# Patient Record
Sex: Female | Born: 1980 | Race: Black or African American | Hispanic: No | Marital: Single | Smoking: Current every day smoker
Health system: Southern US, Community
[De-identification: ages and names within clinical notes are randomized; demographics above are authoritative.]

## PROBLEM LIST (undated history)

## (undated) ENCOUNTER — Ambulatory Visit (HOSPITAL_COMMUNITY): Admission: EM | Payer: Medicaid Other | Source: Home / Self Care

## (undated) DIAGNOSIS — D649 Anemia, unspecified: Secondary | ICD-10-CM

## (undated) DIAGNOSIS — B999 Unspecified infectious disease: Secondary | ICD-10-CM

## (undated) DIAGNOSIS — D219 Benign neoplasm of connective and other soft tissue, unspecified: Secondary | ICD-10-CM

## (undated) DIAGNOSIS — R87629 Unspecified abnormal cytological findings in specimens from vagina: Secondary | ICD-10-CM

## (undated) DIAGNOSIS — IMO0002 Reserved for concepts with insufficient information to code with codable children: Secondary | ICD-10-CM

## (undated) DIAGNOSIS — R87619 Unspecified abnormal cytological findings in specimens from cervix uteri: Secondary | ICD-10-CM

## (undated) DIAGNOSIS — I10 Essential (primary) hypertension: Secondary | ICD-10-CM

## (undated) DIAGNOSIS — F32A Depression, unspecified: Secondary | ICD-10-CM

## (undated) DIAGNOSIS — N83209 Unspecified ovarian cyst, unspecified side: Secondary | ICD-10-CM

## (undated) DIAGNOSIS — F419 Anxiety disorder, unspecified: Secondary | ICD-10-CM

## (undated) DIAGNOSIS — D489 Neoplasm of uncertain behavior, unspecified: Secondary | ICD-10-CM

## (undated) DIAGNOSIS — M8550 Aneurysmal bone cyst, unspecified site: Secondary | ICD-10-CM

## (undated) DIAGNOSIS — F329 Major depressive disorder, single episode, unspecified: Secondary | ICD-10-CM

## (undated) HISTORY — PX: DILATION AND CURETTAGE OF UTERUS: SHX78

## (undated) HISTORY — PX: TUBAL LIGATION: SHX77

## (undated) HISTORY — DX: Neoplasm of uncertain behavior, unspecified: D48.9

## (undated) HISTORY — DX: Aneurysmal bone cyst, unspecified site: M85.50

## (undated) HISTORY — PX: HERNIA REPAIR: SHX51

---
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
## 1998-03-17 ENCOUNTER — Inpatient Hospital Stay (HOSPITAL_COMMUNITY): Admission: AD | Admit: 1998-03-17 | Discharge: 1998-03-17 | Payer: Self-pay | Admitting: *Deleted

## 1998-03-27 ENCOUNTER — Inpatient Hospital Stay (HOSPITAL_COMMUNITY): Admission: AD | Admit: 1998-03-27 | Discharge: 1998-03-27 | Payer: Self-pay | Admitting: Obstetrics & Gynecology

## 1998-04-21 ENCOUNTER — Ambulatory Visit (HOSPITAL_COMMUNITY): Admission: RE | Admit: 1998-04-21 | Discharge: 1998-04-21 | Payer: Self-pay | Admitting: *Deleted

## 1998-05-06 ENCOUNTER — Inpatient Hospital Stay (HOSPITAL_COMMUNITY): Admission: AD | Admit: 1998-05-06 | Discharge: 1998-05-06 | Payer: Self-pay | Admitting: *Deleted

## 1998-05-29 ENCOUNTER — Inpatient Hospital Stay (HOSPITAL_COMMUNITY): Admission: AD | Admit: 1998-05-29 | Discharge: 1998-05-29 | Payer: Self-pay | Admitting: *Deleted

## 1998-06-17 ENCOUNTER — Ambulatory Visit (HOSPITAL_COMMUNITY): Admission: RE | Admit: 1998-06-17 | Discharge: 1998-06-17 | Payer: Self-pay | Admitting: *Deleted

## 1998-06-19 ENCOUNTER — Ambulatory Visit (HOSPITAL_COMMUNITY): Admission: RE | Admit: 1998-06-19 | Discharge: 1998-06-19 | Payer: Self-pay | Admitting: *Deleted

## 1998-08-05 ENCOUNTER — Emergency Department (HOSPITAL_COMMUNITY): Admission: EM | Admit: 1998-08-05 | Discharge: 1998-08-05 | Payer: Self-pay | Admitting: Emergency Medicine

## 1998-08-24 ENCOUNTER — Ambulatory Visit (HOSPITAL_COMMUNITY): Admission: RE | Admit: 1998-08-24 | Discharge: 1998-08-24 | Payer: Self-pay | Admitting: Obstetrics

## 1998-08-26 ENCOUNTER — Encounter: Payer: Self-pay | Admitting: Obstetrics

## 1998-08-26 ENCOUNTER — Ambulatory Visit (HOSPITAL_COMMUNITY): Admission: RE | Admit: 1998-08-26 | Discharge: 1998-08-26 | Payer: Self-pay | Admitting: Obstetrics

## 1998-08-31 ENCOUNTER — Inpatient Hospital Stay (HOSPITAL_COMMUNITY): Admission: AD | Admit: 1998-08-31 | Discharge: 1998-08-31 | Payer: Self-pay | Admitting: Obstetrics

## 1998-10-08 ENCOUNTER — Ambulatory Visit (HOSPITAL_COMMUNITY): Admission: RE | Admit: 1998-10-08 | Discharge: 1998-10-08 | Payer: Self-pay | Admitting: Obstetrics

## 1998-10-08 ENCOUNTER — Encounter: Payer: Self-pay | Admitting: Obstetrics

## 1998-10-16 ENCOUNTER — Emergency Department (HOSPITAL_COMMUNITY): Admission: EM | Admit: 1998-10-16 | Discharge: 1998-10-16 | Payer: Self-pay | Admitting: Emergency Medicine

## 1998-10-24 ENCOUNTER — Inpatient Hospital Stay (HOSPITAL_COMMUNITY): Admission: AD | Admit: 1998-10-24 | Discharge: 1998-10-24 | Payer: Self-pay | Admitting: Obstetrics

## 1998-11-15 ENCOUNTER — Inpatient Hospital Stay (HOSPITAL_COMMUNITY): Admission: AD | Admit: 1998-11-15 | Discharge: 1998-11-15 | Payer: Self-pay | Admitting: Obstetrics

## 1998-11-16 ENCOUNTER — Inpatient Hospital Stay (HOSPITAL_COMMUNITY): Admission: AD | Admit: 1998-11-16 | Discharge: 1998-11-16 | Payer: Self-pay | Admitting: Obstetrics

## 1998-11-18 ENCOUNTER — Inpatient Hospital Stay (HOSPITAL_COMMUNITY): Admission: AD | Admit: 1998-11-18 | Discharge: 1998-11-18 | Payer: Self-pay | Admitting: Obstetrics

## 1998-11-24 ENCOUNTER — Inpatient Hospital Stay (HOSPITAL_COMMUNITY): Admission: AD | Admit: 1998-11-24 | Discharge: 1998-11-28 | Payer: Self-pay | Admitting: Obstetrics

## 1998-12-15 ENCOUNTER — Encounter: Admission: RE | Admit: 1998-12-15 | Discharge: 1999-03-15 | Payer: Self-pay | Admitting: Obstetrics

## 1999-01-27 ENCOUNTER — Inpatient Hospital Stay (HOSPITAL_COMMUNITY): Admission: AD | Admit: 1999-01-27 | Discharge: 1999-01-27 | Payer: Self-pay | Admitting: Obstetrics

## 1999-05-23 ENCOUNTER — Inpatient Hospital Stay (HOSPITAL_COMMUNITY): Admission: AD | Admit: 1999-05-23 | Discharge: 1999-05-23 | Payer: Self-pay | Admitting: Obstetrics

## 2000-01-19 ENCOUNTER — Other Ambulatory Visit: Admission: RE | Admit: 2000-01-19 | Discharge: 2000-01-19 | Payer: Self-pay | Admitting: Obstetrics

## 2000-02-14 ENCOUNTER — Emergency Department (HOSPITAL_COMMUNITY): Admission: EM | Admit: 2000-02-14 | Discharge: 2000-02-14 | Payer: Self-pay

## 2000-05-28 ENCOUNTER — Emergency Department (HOSPITAL_COMMUNITY): Admission: EM | Admit: 2000-05-28 | Discharge: 2000-05-28 | Payer: Self-pay | Admitting: Emergency Medicine

## 2000-10-29 ENCOUNTER — Inpatient Hospital Stay (HOSPITAL_COMMUNITY): Admission: AD | Admit: 2000-10-29 | Discharge: 2000-10-29 | Payer: Self-pay | Admitting: *Deleted

## 2001-02-19 ENCOUNTER — Inpatient Hospital Stay (HOSPITAL_COMMUNITY): Admission: AD | Admit: 2001-02-19 | Discharge: 2001-02-19 | Payer: Self-pay | Admitting: Obstetrics

## 2001-05-07 ENCOUNTER — Encounter: Payer: Self-pay | Admitting: Obstetrics

## 2001-05-07 ENCOUNTER — Inpatient Hospital Stay (HOSPITAL_COMMUNITY): Admission: AD | Admit: 2001-05-07 | Discharge: 2001-05-07 | Payer: Self-pay | Admitting: Obstetrics

## 2001-05-27 ENCOUNTER — Emergency Department (HOSPITAL_COMMUNITY): Admission: EM | Admit: 2001-05-27 | Discharge: 2001-05-27 | Payer: Self-pay | Admitting: Emergency Medicine

## 2001-08-01 ENCOUNTER — Encounter: Payer: Self-pay | Admitting: Obstetrics

## 2001-08-01 ENCOUNTER — Inpatient Hospital Stay (HOSPITAL_COMMUNITY): Admission: AD | Admit: 2001-08-01 | Discharge: 2001-08-01 | Payer: Self-pay | Admitting: Obstetrics and Gynecology

## 2001-08-14 ENCOUNTER — Inpatient Hospital Stay (HOSPITAL_COMMUNITY): Admission: AD | Admit: 2001-08-14 | Discharge: 2001-08-14 | Payer: Self-pay | Admitting: Obstetrics

## 2001-08-25 ENCOUNTER — Inpatient Hospital Stay (HOSPITAL_COMMUNITY): Admission: AD | Admit: 2001-08-25 | Discharge: 2001-08-25 | Payer: Self-pay | Admitting: Obstetrics

## 2001-08-27 ENCOUNTER — Inpatient Hospital Stay (HOSPITAL_COMMUNITY): Admission: EM | Admit: 2001-08-27 | Discharge: 2001-08-28 | Payer: Self-pay | Admitting: Psychiatry

## 2001-09-11 ENCOUNTER — Encounter: Payer: Self-pay | Admitting: Obstetrics

## 2001-09-11 ENCOUNTER — Ambulatory Visit (HOSPITAL_COMMUNITY): Admission: RE | Admit: 2001-09-11 | Discharge: 2001-09-11 | Payer: Self-pay | Admitting: Obstetrics

## 2001-09-26 ENCOUNTER — Inpatient Hospital Stay (HOSPITAL_COMMUNITY): Admission: AD | Admit: 2001-09-26 | Discharge: 2001-09-26 | Payer: Self-pay | Admitting: Obstetrics

## 2001-10-02 ENCOUNTER — Ambulatory Visit (HOSPITAL_COMMUNITY): Admission: RE | Admit: 2001-10-02 | Discharge: 2001-10-02 | Payer: Self-pay | Admitting: Obstetrics

## 2001-10-02 ENCOUNTER — Encounter: Payer: Self-pay | Admitting: Obstetrics

## 2001-10-07 ENCOUNTER — Inpatient Hospital Stay (HOSPITAL_COMMUNITY): Admission: AD | Admit: 2001-10-07 | Discharge: 2001-10-07 | Payer: Self-pay | Admitting: Obstetrics

## 2001-10-23 ENCOUNTER — Ambulatory Visit (HOSPITAL_COMMUNITY): Admission: RE | Admit: 2001-10-23 | Discharge: 2001-10-23 | Payer: Self-pay | Admitting: Obstetrics

## 2001-10-23 ENCOUNTER — Encounter: Payer: Self-pay | Admitting: Obstetrics

## 2001-10-26 ENCOUNTER — Encounter: Payer: Self-pay | Admitting: *Deleted

## 2001-10-27 ENCOUNTER — Observation Stay (HOSPITAL_COMMUNITY): Admission: AD | Admit: 2001-10-27 | Discharge: 2001-10-28 | Payer: Self-pay | Admitting: Obstetrics

## 2001-10-27 ENCOUNTER — Encounter: Payer: Self-pay | Admitting: Obstetrics & Gynecology

## 2001-10-28 ENCOUNTER — Encounter: Payer: Self-pay | Admitting: Obstetrics & Gynecology

## 2001-11-04 ENCOUNTER — Inpatient Hospital Stay (HOSPITAL_COMMUNITY): Admission: AD | Admit: 2001-11-04 | Discharge: 2001-11-04 | Payer: Self-pay | Admitting: Obstetrics

## 2002-02-26 ENCOUNTER — Ambulatory Visit (HOSPITAL_COMMUNITY): Admission: RE | Admit: 2002-02-26 | Discharge: 2002-02-26 | Payer: Self-pay | Admitting: Obstetrics

## 2002-05-11 ENCOUNTER — Emergency Department (HOSPITAL_COMMUNITY): Admission: EM | Admit: 2002-05-11 | Discharge: 2002-05-11 | Payer: Self-pay | Admitting: Emergency Medicine

## 2002-07-15 ENCOUNTER — Emergency Department (HOSPITAL_COMMUNITY): Admission: AD | Admit: 2002-07-15 | Discharge: 2002-07-15 | Payer: Self-pay | Admitting: Emergency Medicine

## 2003-01-09 ENCOUNTER — Emergency Department (HOSPITAL_COMMUNITY): Admission: EM | Admit: 2003-01-09 | Discharge: 2003-01-09 | Payer: Self-pay | Admitting: Emergency Medicine

## 2003-01-10 ENCOUNTER — Emergency Department (HOSPITAL_COMMUNITY): Admission: EM | Admit: 2003-01-10 | Discharge: 2003-01-10 | Payer: Self-pay | Admitting: Emergency Medicine

## 2003-02-04 ENCOUNTER — Emergency Department (HOSPITAL_COMMUNITY): Admission: EM | Admit: 2003-02-04 | Discharge: 2003-02-04 | Payer: Self-pay | Admitting: Emergency Medicine

## 2003-10-04 ENCOUNTER — Emergency Department (HOSPITAL_COMMUNITY): Admission: EM | Admit: 2003-10-04 | Discharge: 2003-10-04 | Payer: Self-pay | Admitting: Family Medicine

## 2003-12-23 ENCOUNTER — Emergency Department (HOSPITAL_COMMUNITY): Admission: EM | Admit: 2003-12-23 | Discharge: 2003-12-23 | Payer: Self-pay | Admitting: Emergency Medicine

## 2005-05-30 ENCOUNTER — Emergency Department (HOSPITAL_COMMUNITY): Admission: EM | Admit: 2005-05-30 | Discharge: 2005-05-30 | Payer: Self-pay | Admitting: Emergency Medicine

## 2005-06-01 ENCOUNTER — Emergency Department (HOSPITAL_COMMUNITY): Admission: EM | Admit: 2005-06-01 | Discharge: 2005-06-01 | Payer: Self-pay | Admitting: Family Medicine

## 2005-10-01 ENCOUNTER — Emergency Department (HOSPITAL_COMMUNITY): Admission: EM | Admit: 2005-10-01 | Discharge: 2005-10-01 | Payer: Self-pay | Admitting: Family Medicine

## 2006-07-12 ENCOUNTER — Emergency Department (HOSPITAL_COMMUNITY): Admission: EM | Admit: 2006-07-12 | Discharge: 2006-07-12 | Payer: Self-pay | Admitting: Emergency Medicine

## 2006-09-11 ENCOUNTER — Emergency Department (HOSPITAL_COMMUNITY): Admission: EM | Admit: 2006-09-11 | Discharge: 2006-09-11 | Payer: Self-pay | Admitting: Emergency Medicine

## 2007-04-01 ENCOUNTER — Emergency Department (HOSPITAL_COMMUNITY): Admission: EM | Admit: 2007-04-01 | Discharge: 2007-04-01 | Payer: Self-pay | Admitting: Emergency Medicine

## 2007-04-10 ENCOUNTER — Emergency Department (HOSPITAL_COMMUNITY): Admission: EM | Admit: 2007-04-10 | Discharge: 2007-04-10 | Payer: Self-pay | Admitting: Emergency Medicine

## 2007-08-13 ENCOUNTER — Emergency Department (HOSPITAL_COMMUNITY): Admission: EM | Admit: 2007-08-13 | Discharge: 2007-08-13 | Payer: Self-pay | Admitting: Emergency Medicine

## 2007-11-01 ENCOUNTER — Inpatient Hospital Stay (HOSPITAL_COMMUNITY): Admission: AD | Admit: 2007-11-01 | Discharge: 2007-11-01 | Payer: Self-pay | Admitting: Family Medicine

## 2007-11-12 ENCOUNTER — Inpatient Hospital Stay (HOSPITAL_COMMUNITY): Admission: AD | Admit: 2007-11-12 | Discharge: 2007-11-12 | Payer: Self-pay | Admitting: Obstetrics & Gynecology

## 2008-02-17 ENCOUNTER — Inpatient Hospital Stay (HOSPITAL_COMMUNITY): Admission: AD | Admit: 2008-02-17 | Discharge: 2008-02-17 | Payer: Self-pay | Admitting: Internal Medicine

## 2008-04-24 ENCOUNTER — Emergency Department (HOSPITAL_COMMUNITY): Admission: EM | Admit: 2008-04-24 | Discharge: 2008-04-24 | Payer: Self-pay | Admitting: Emergency Medicine

## 2008-05-08 ENCOUNTER — Inpatient Hospital Stay (HOSPITAL_COMMUNITY): Admission: AD | Admit: 2008-05-08 | Discharge: 2008-05-08 | Payer: Self-pay | Admitting: Obstetrics & Gynecology

## 2008-09-11 ENCOUNTER — Emergency Department (HOSPITAL_COMMUNITY): Admission: EM | Admit: 2008-09-11 | Discharge: 2008-09-11 | Payer: Self-pay | Admitting: Emergency Medicine

## 2009-11-24 ENCOUNTER — Inpatient Hospital Stay (HOSPITAL_COMMUNITY): Admission: EM | Admit: 2009-11-24 | Discharge: 2009-11-25 | Payer: Self-pay | Admitting: Emergency Medicine

## 2009-12-01 ENCOUNTER — Ambulatory Visit (HOSPITAL_COMMUNITY)
Admission: RE | Admit: 2009-12-01 | Discharge: 2009-12-01 | Payer: Self-pay | Source: Home / Self Care | Admitting: Internal Medicine

## 2010-01-15 ENCOUNTER — Emergency Department (HOSPITAL_COMMUNITY)
Admission: EM | Admit: 2010-01-15 | Discharge: 2010-01-15 | Payer: Self-pay | Source: Home / Self Care | Admitting: Emergency Medicine

## 2010-01-18 LAB — COMPREHENSIVE METABOLIC PANEL
ALT: 14 U/L (ref 0–35)
AST: 18 U/L (ref 0–37)
Albumin: 3.6 g/dL (ref 3.5–5.2)
Alkaline Phosphatase: 71 U/L (ref 39–117)
BUN: 7 mg/dL (ref 6–23)
CO2: 25 mEq/L (ref 19–32)
Calcium: 8.9 mg/dL (ref 8.4–10.5)
Chloride: 108 mEq/L (ref 96–112)
Creatinine, Ser: 0.83 mg/dL (ref 0.4–1.2)
GFR calc Af Amer: >60 mL/min (ref >60)
GFR calc non Af Amer: >60 mL/min (ref >60)
Glucose, Bld: 113 mg/dL — ABNORMAL HIGH (ref 70–99)
Potassium: 3.9 mEq/L (ref 3.5–5.1)
Sodium: 140 mEq/L (ref 135–145)
Total Bilirubin: 0.4 mg/dL (ref 0.3–1.2)
Total Protein: 7 g/dL (ref 6.0–8.3)

## 2010-01-18 LAB — CBC
HCT: 37 % (ref 36.0–46.0)
Hemoglobin: 12.3 g/dL (ref 12.0–15.0)
MCH: 30.4 pg (ref 26.0–34.0)
MCHC: 33.2 g/dL (ref 30.0–36.0)
MCV: 91.6 fL (ref 78.0–100.0)
Platelets: 307 10*3/uL (ref 150–400)
RBC: 4.04 MIL/uL (ref 3.87–5.11)
RDW: 13.5 % (ref 11.5–15.5)
WBC: 8.7 10*3/uL (ref 4.0–10.5)

## 2010-01-18 LAB — URINALYSIS, ROUTINE W REFLEX MICROSCOPIC
Bilirubin Urine: NEGATIVE
Ketones, ur: NEGATIVE mg/dL
Leukocytes, UA: NEGATIVE
Nitrite: NEGATIVE
Protein, ur: NEGATIVE mg/dL
Specific Gravity, Urine: 1.019 (ref 1.005–1.030)
Urine Glucose, Fasting: NEGATIVE mg/dL
Urobilinogen, UA: 0.2 mg/dL (ref 0.0–1.0)
pH: 6 (ref 5.0–8.0)

## 2010-01-18 LAB — DIFFERENTIAL
Basophils Absolute: 0 10*3/uL (ref 0.0–0.1)
Basophils Relative: 0 % (ref 0–1)
Eosinophils Absolute: 0.1 10*3/uL (ref 0.0–0.7)
Eosinophils Relative: 1 % (ref 0–5)
Lymphocytes Relative: 41 % (ref 12–46)
Lymphs Abs: 3.6 10*3/uL (ref 0.7–4.0)
Monocytes Absolute: 0.4 10*3/uL (ref 0.1–1.0)
Monocytes Relative: 5 % (ref 3–12)
Neutro Abs: 4.7 10*3/uL (ref 1.7–7.7)
Neutrophils Relative %: 54 % (ref 43–77)

## 2010-01-18 LAB — GC/CHLAMYDIA PROBE AMP, GENITAL
Chlamydia, DNA Probe: NEGATIVE
GC Probe Amp, Genital: NEGATIVE

## 2010-01-18 LAB — WET PREP, GENITAL
Trich, Wet Prep: NONE SEEN
WBC, Wet Prep HPF POC: NONE SEEN
Yeast Wet Prep HPF POC: NONE SEEN

## 2010-01-18 LAB — POCT PREGNANCY, URINE: Preg Test, Ur: NEGATIVE

## 2010-01-18 LAB — URINE MICROSCOPIC-ADD ON

## 2010-02-24 ENCOUNTER — Emergency Department (HOSPITAL_COMMUNITY): Payer: Medicaid Other

## 2010-02-24 ENCOUNTER — Emergency Department (HOSPITAL_COMMUNITY)
Admission: EM | Admit: 2010-02-24 | Discharge: 2010-02-24 | Disposition: A | Discharge status: Home / Self Care | Payer: Medicaid Other | Attending: Emergency Medicine | Admitting: Emergency Medicine

## 2010-02-24 DIAGNOSIS — R1031 Right lower quadrant pain: Secondary | ICD-10-CM | POA: Insufficient documentation

## 2010-02-24 DIAGNOSIS — B9689 Other specified bacterial agents as the cause of diseases classified elsewhere: Secondary | ICD-10-CM | POA: Insufficient documentation

## 2010-02-24 DIAGNOSIS — N83209 Unspecified ovarian cyst, unspecified side: Secondary | ICD-10-CM | POA: Insufficient documentation

## 2010-02-24 DIAGNOSIS — N760 Acute vaginitis: Secondary | ICD-10-CM | POA: Insufficient documentation

## 2010-02-24 DIAGNOSIS — A499 Bacterial infection, unspecified: Secondary | ICD-10-CM | POA: Insufficient documentation

## 2010-02-24 LAB — COMPREHENSIVE METABOLIC PANEL
AST: 23 U/L (ref 0–37)
BUN: 15 mg/dL (ref 6–23)
CO2: 22 mEq/L (ref 19–32)
Calcium: 9.5 mg/dL (ref 8.4–10.5)
Creatinine, Ser: 0.99 mg/dL (ref 0.4–1.2)
GFR calc Af Amer: >60 mL/min (ref >60)
GFR calc non Af Amer: >60 mL/min (ref >60)

## 2010-02-24 LAB — URINALYSIS, ROUTINE W REFLEX MICROSCOPIC
Bilirubin Urine: NEGATIVE
Nitrite: NEGATIVE
Specific Gravity, Urine: 1.017 (ref 1.005–1.030)
Urobilinogen, UA: 0.2 mg/dL (ref 0.0–1.0)
pH: 6.5 (ref 5.0–8.0)

## 2010-02-24 LAB — DIFFERENTIAL
Basophils Relative: 0 % (ref 0–1)
Eosinophils Absolute: 0.1 10*3/uL (ref 0.0–0.7)
Monocytes Absolute: 0.6 10*3/uL (ref 0.1–1.0)
Neutro Abs: 5.3 10*3/uL (ref 1.7–7.7)

## 2010-02-24 LAB — CBC
MCH: 29.3 pg (ref 26.0–34.0)
MCV: 89.4 fL (ref 78.0–100.0)
Platelets: 339 10*3/uL (ref 150–400)
RDW: 13.1 % (ref 11.5–15.5)

## 2010-02-24 LAB — RAPID URINE DRUG SCREEN, HOSP PERFORMED
Benzodiazepines: NOT DETECTED
Cocaine: NOT DETECTED
Opiates: NOT DETECTED
Tetrahydrocannabinol: POSITIVE — AB

## 2010-02-24 LAB — WET PREP, GENITAL

## 2010-02-25 LAB — RPR: RPR Ser Ql: NONREACTIVE

## 2010-02-25 LAB — GC/CHLAMYDIA PROBE AMP, GENITAL
Chlamydia, DNA Probe: NEGATIVE
GC Probe Amp, Genital: NEGATIVE

## 2010-03-16 LAB — COMPREHENSIVE METABOLIC PANEL
ALT: 16 U/L (ref 0–35)
AST: 16 U/L (ref 0–37)
AST: 16 U/L (ref 0–37)
Albumin: 3.4 g/dL — ABNORMAL LOW (ref 3.5–5.2)
Alkaline Phosphatase: 62 U/L (ref 39–117)
CO2: 25 mEq/L (ref 19–32)
Chloride: 106 mEq/L (ref 96–112)
Chloride: 109 mEq/L (ref 96–112)
Creatinine, Ser: 0.79 mg/dL (ref 0.4–1.2)
Creatinine, Ser: 0.84 mg/dL (ref 0.4–1.2)
GFR calc Af Amer: >60 mL/min (ref >60)
GFR calc Af Amer: >60 mL/min (ref >60)
GFR calc non Af Amer: >60 mL/min (ref >60)
Potassium: 4.1 mEq/L (ref 3.5–5.1)
Potassium: 4.1 mEq/L (ref 3.5–5.1)
Sodium: 139 mEq/L (ref 135–145)
Total Bilirubin: 0.2 mg/dL — ABNORMAL LOW (ref 0.3–1.2)
Total Bilirubin: 0.3 mg/dL (ref 0.3–1.2)

## 2010-03-16 LAB — CBC
HCT: 39.9 % (ref 36.0–46.0)
Hemoglobin: 13.6 g/dL (ref 12.0–15.0)
MCH: 30 pg (ref 26.0–34.0)
MCHC: 34.1 g/dL (ref 30.0–36.0)
MCHC: 34.7 g/dL (ref 30.0–36.0)
MCV: 88.1 fL (ref 78.0–100.0)
Platelets: 354 10*3/uL (ref 150–400)
RDW: 13.7 % (ref 11.5–15.5)
RDW: 14.2 % (ref 11.5–15.5)
WBC: 9.3 10*3/uL (ref 4.0–10.5)

## 2010-03-16 LAB — DIFFERENTIAL
Basophils Absolute: 0 10*3/uL (ref 0.0–0.1)
Basophils Relative: 0 % (ref 0–1)
Lymphocytes Relative: 47 % — ABNORMAL HIGH (ref 12–46)
Monocytes Absolute: 0.5 10*3/uL (ref 0.1–1.0)
Neutro Abs: 4.3 10*3/uL (ref 1.7–7.7)
Neutrophils Relative %: 46 % (ref 43–77)

## 2010-03-16 LAB — BASIC METABOLIC PANEL
BUN: 12 mg/dL (ref 6–23)
Calcium: 9.4 mg/dL (ref 8.4–10.5)
Creatinine, Ser: 0.89 mg/dL (ref 0.4–1.2)
GFR calc non Af Amer: >60 mL/min (ref >60)
Glucose, Bld: 98 mg/dL (ref 70–99)
Potassium: 3.9 mEq/L (ref 3.5–5.1)

## 2010-03-16 LAB — CEA: CEA: 1.4 ng/mL (ref 0.0–5.0)

## 2010-03-16 LAB — URINALYSIS, ROUTINE W REFLEX MICROSCOPIC
Bilirubin Urine: NEGATIVE
Hgb urine dipstick: NEGATIVE
Nitrite: NEGATIVE
Protein, ur: NEGATIVE mg/dL
Specific Gravity, Urine: 1.016 (ref 1.005–1.030)
Urobilinogen, UA: 0.2 mg/dL (ref 0.0–1.0)

## 2010-03-16 LAB — PROTEIN ELECTROPHORESIS, SERUM
Albumin ELP: 54.7 % — ABNORMAL LOW (ref 55.8–66.1)
Alpha-1-Globulin: 4.3 % (ref 2.9–4.9)
Beta 2: 5.6 % (ref 3.2–6.5)
Total Protein ELP: 6.3 g/dL (ref 6.0–8.3)

## 2010-03-16 LAB — RAPID URINE DRUG SCREEN, HOSP PERFORMED
Amphetamines: NOT DETECTED
Barbiturates: NOT DETECTED
Opiates: NOT DETECTED
Tetrahydrocannabinol: POSITIVE — AB

## 2010-03-16 LAB — SEDIMENTATION RATE: Sed Rate: 7 mm/hr (ref 0–22)

## 2010-03-16 LAB — LIPASE, BLOOD: Lipase: 28 U/L (ref 11–59)

## 2010-04-09 LAB — GC/CHLAMYDIA PROBE AMP, GENITAL
Chlamydia, DNA Probe: NEGATIVE
GC Probe Amp, Genital: NEGATIVE

## 2010-04-09 LAB — RPR: RPR Ser Ql: NONREACTIVE

## 2010-04-09 LAB — CBC
HCT: 41.5 % (ref 36.0–46.0)
Hemoglobin: 13.9 g/dL (ref 12.0–15.0)
MCHC: 33.5 g/dL (ref 30.0–36.0)
RBC: 4.42 MIL/uL (ref 3.87–5.11)
RDW: 13.7 % (ref 11.5–15.5)

## 2010-04-09 LAB — URINALYSIS, ROUTINE W REFLEX MICROSCOPIC
Glucose, UA: NEGATIVE mg/dL
Ketones, ur: NEGATIVE mg/dL
Leukocytes, UA: NEGATIVE
Nitrite: NEGATIVE
Protein, ur: NEGATIVE mg/dL
Urobilinogen, UA: 0.2 mg/dL (ref 0.0–1.0)

## 2010-04-09 LAB — COMPREHENSIVE METABOLIC PANEL
ALT: 20 U/L (ref 0–35)
Alkaline Phosphatase: 76 U/L (ref 39–117)
BUN: 5 mg/dL — ABNORMAL LOW (ref 6–23)
CO2: 25 mEq/L (ref 19–32)
Calcium: 9.2 mg/dL (ref 8.4–10.5)
GFR calc non Af Amer: >60 mL/min (ref >60)
Glucose, Bld: 107 mg/dL — ABNORMAL HIGH (ref 70–99)
Total Protein: 7.2 g/dL (ref 6.0–8.3)

## 2010-04-09 LAB — URINE MICROSCOPIC-ADD ON

## 2010-04-09 LAB — DIFFERENTIAL
Basophils Relative: 1 % (ref 0–1)
Eosinophils Absolute: 0.1 10*3/uL (ref 0.0–0.7)
Monocytes Relative: 6 % (ref 3–12)
Neutro Abs: 6 10*3/uL (ref 1.7–7.7)
Neutrophils Relative %: 61 % (ref 43–77)

## 2010-04-09 LAB — LIPASE, BLOOD: Lipase: 18 U/L (ref 11–59)

## 2010-04-09 LAB — POCT PREGNANCY, URINE: Preg Test, Ur: NEGATIVE

## 2010-04-13 LAB — COMPREHENSIVE METABOLIC PANEL
ALT: 18 U/L (ref 0–35)
AST: 21 U/L (ref 0–37)
CO2: 25 mEq/L (ref 19–32)
Chloride: 103 mEq/L (ref 96–112)
GFR calc Af Amer: >60 mL/min (ref >60)
GFR calc non Af Amer: >60 mL/min (ref >60)
Sodium: 135 mEq/L (ref 135–145)
Total Bilirubin: 0.5 mg/dL (ref 0.3–1.2)

## 2010-04-13 LAB — DIFFERENTIAL
Basophils Absolute: 0 10*3/uL (ref 0.0–0.1)
Eosinophils Absolute: 0.1 10*3/uL (ref 0.0–0.7)
Eosinophils Relative: 1 % (ref 0–5)
Lymphs Abs: 3.3 10*3/uL (ref 0.7–4.0)

## 2010-04-13 LAB — URINALYSIS, ROUTINE W REFLEX MICROSCOPIC
Glucose, UA: NEGATIVE mg/dL
Ketones, ur: NEGATIVE mg/dL
Protein, ur: NEGATIVE mg/dL

## 2010-04-13 LAB — GC/CHLAMYDIA PROBE AMP, GENITAL
Chlamydia, DNA Probe: NEGATIVE
GC Probe Amp, Genital: NEGATIVE

## 2010-04-13 LAB — CBC
RBC: 4.29 MIL/uL (ref 3.87–5.11)
WBC: 8.9 10*3/uL (ref 4.0–10.5)

## 2010-04-13 LAB — URINE MICROSCOPIC-ADD ON

## 2010-04-13 LAB — WET PREP, GENITAL

## 2010-04-20 LAB — CBC
MCV: 91.6 fL (ref 78.0–100.0)
Platelets: 310 10*3/uL (ref 150–400)
RDW: 14.4 % (ref 11.5–15.5)
WBC: 9 10*3/uL (ref 4.0–10.5)

## 2010-04-20 LAB — DIFFERENTIAL
Basophils Absolute: 0.1 10*3/uL (ref 0.0–0.1)
Eosinophils Relative: 1 % (ref 0–5)
Lymphocytes Relative: 36 % (ref 12–46)
Lymphs Abs: 3.3 10*3/uL (ref 0.7–4.0)
Monocytes Absolute: 0.6 10*3/uL (ref 0.1–1.0)
Monocytes Relative: 6 % (ref 3–12)

## 2010-04-20 LAB — COMPREHENSIVE METABOLIC PANEL
AST: 24 U/L (ref 0–37)
Albumin: 4.2 g/dL (ref 3.5–5.2)
Chloride: 103 mEq/L (ref 96–112)
Creatinine, Ser: 0.81 mg/dL (ref 0.4–1.2)
GFR calc Af Amer: >60 mL/min (ref >60)
Total Bilirubin: 0.5 mg/dL (ref 0.3–1.2)
Total Protein: 7.7 g/dL (ref 6.0–8.3)

## 2010-04-20 LAB — URINALYSIS, ROUTINE W REFLEX MICROSCOPIC
Bilirubin Urine: NEGATIVE
Glucose, UA: NEGATIVE mg/dL
Ketones, ur: NEGATIVE mg/dL
Specific Gravity, Urine: 1.025 (ref 1.005–1.030)
pH: 6 (ref 5.0–8.0)

## 2010-04-20 LAB — URINE MICROSCOPIC-ADD ON

## 2010-04-20 LAB — GC/CHLAMYDIA PROBE AMP, GENITAL
Chlamydia, DNA Probe: NEGATIVE
GC Probe Amp, Genital: NEGATIVE

## 2010-04-20 LAB — WET PREP, GENITAL: Trich, Wet Prep: NONE SEEN

## 2010-05-21 NOTE — Op Note (Signed)
   NAME:  Victoria Clayton, Victoria Clayton                 ACCOUNT NO.:  [id number]   MEDICAL RECORD NO.:  [id number]                   PATIENT TYPE:  AMB   LOCATION:  SDC                                  FACILITY:  [facility]   PHYSICIAN:  Kathreen Cosier, M.D.           DATE OF BIRTH:  October 22, 1980   DATE OF PROCEDURE:  02/26/2002  DATE OF DISCHARGE:                                 OPERATIVE REPORT   PREOPERATIVE DIAGNOSES:  Multiparity.   PROCEDURE:  Open laparoscopic tubal sterilization.   DESCRIPTION OF PROCEDURE:  The patient placed on the operating table  lithotomy position.  Abdomen, perineum, and vagina prepped and draped.  The  bladder emptied with a straight catheter.  A weighted speculum placed in the  vagina and the cervix grasped with a Hulka tenaculum.  In the umbilicus a  transverse incision made, carried down to the fascia.  Fascia cleaned and  grasped with two Kocher's.  Fascia on the peritoneum opened with the Mayo  scissors.  The sleeve of the trocar inserted intraperitoneally.  3 L carbon  dioxide was infused intraperitoneally.  Visualizing scope was inserted,  hooked up to the camera.  The tubes were noted to be normal.  Ovaries  normal.  The right tube was traced to the fimbria and then the tube was  grasped 1 inch from the cornua and cauterized.  The tube was cauterized in a  total of four places moving lateral from the first site of cautery.  The  procedure was done in exact fashion on the other side.  The probes were  removed.  CO2 allowed to escape from the peritoneal cavity and the fascia  closed with one stitch of 0 Dexon and skin closed with subcuticular stitch  of 3-0 Monocryl.  The patient tolerated procedure well.  Taken to recovery  room in good condition.                                               Kathreen Cosier, M.D.    BAM/MEDQ  D:  02/26/2002  T:  02/26/2002  Job:  [id number]

## 2010-05-21 NOTE — Op Note (Signed)
NAME:  Victoria Clayton, Victoria Clayton                 ACCOUNT NO.:  [id number]   MEDICAL RECORD NO.:  [id number]                   PATIENT TYPE:  AMB   LOCATION:  ENDO                                 FACILITY:  [facility]   PHYSICIAN:  Sharyn Dross., M.D.               DATE OF BIRTH:  04-08-1980   DATE OF PROCEDURE:  DATE OF DISCHARGE:  10/26/2001                                 OPERATIVE REPORT   PREOPERATIVE DIAGNOSES:  1. Chest pain.  2. Hematemesis.   POSTOPERATIVE DIAGNOSES:  1. Increased food debris present in the gastric body and distal esophagus.  2. No evidence of blood appreciated.   PROCEDURE:  Esophagogastroduodenoscopy.   MEDICATIONS:  Demerol 40 mg IV, Versed 4 mg IV, over 10 minute period time  with Mylicon 40 mg in the wash as well as Cetacaine Spray to anesthetize the  area.   INSTRUMENT:  Olympus video panendoscope.   ENDOSCOPIST:  Dortha Kern, M.D.   SUBJECTIVE:  This pleasant 30 year old black female was brought in by her  gynecologist to the office for evaluation of chest pains for the last two to  three months. She states that she has been having these pains present and  the pains have been in the anterior chest wall, both on the left and the  right side.  She was given medication by her doctor for nausea and vomiting,  but she states that her pain still exists and symptoms started to become  worse.   Approximately one to two days prior to the procedure today, she stated that  she had vomited up blood.  She states that the intensity of the blood was a  moderate amount present at this time.  She did not have any further bleeding  noted then but one day prior to office visit she did it a second time with  vomitus of blood that was present.  She describes it as being bright red in  color that was ongoing.  The chest pains were still ongoing at the same  time.  Thus, her doctor brought her over to the office for the evaluation.  She had been seen by me and  evaluated and given medications (Nexium) to take  over the next week.  She was advised to contact me after about three to four  days of the medication.  However, because the patient started vomiting up  blood again today she contacted me and it was decided to just bring her in  to the hospital to check the area at this time.   FAMILY HISTORY:  Her mother and her grandmother have hypertension and her  children have asthma.   SOCIAL HISTORY:  The patient does not smoke or drink or take NSAIDS at this  time.   REVIEW OF SYMPTOMS:  Positive for two cesarean sections (one in 1998 and the  other in 2000).  She also admits to having asthma during her first pregnancy  in 1998.  Otherwise her review of systems appears to be within normal  limits.   ALLERGIES:  There have been no allergies noted from my records at the  moment.   CURRENT MEDICATIONS:  Appear to be none.   OBJECTIVE:  She is a pleasant female who appears to be in somewhat moderate  distress at this time from the vomiting (probably nervous because of this).  Her vital signs were stable.  Her blood pressure at the time was 110/50,  pulse rate was 90, respiratory rate was 18.  Her HEENT examination was  normocephalic. Pupils are equal, round and reactive to light and  accommodation.  She was anicteric and the oropharyngeal area did not show  any gross abnormalities present.  The neck was supply without any lymph  nodes or lymphadenopathy appreciated.  Normal palpable thyroid and no bruits  appreciated.  The lungs are clear to auscultation and percussion.  The heart  had a regular rate and rhythm without heaves, thrills murmurs or gallops.  The PMI is in the midclavicular line.  The abdomen shows the patient is 32  week in a gestational period at this time.  There was no tenderness to  palpation, there were decreased bowel sounds noted, but no major tenderness  to palpation.  The extremities showed no clubbing, cyanosis or edema   present.   PLAN:  Based on her condition at this time, and the fact that she is still  having the vomitus of blood, I recommended bringing in and doing the  procedure at this time to determine the amount of blood, the evidence of  blood and where it could be the triggering bleeding site.  I would consider  this possibly all being related to the vomitus that is present, therefore,  it could be esophagitis that is present, erosive gastritis or even a  possible Mallory-Weiss although I think this is less likely.  I am going to  proceed with the endoscopic examination now and depending upon these results  will determine the course of therapy.  I discussed this with her  gynecologist yesterday and it is his impression that she can have the  procedure, if need be.  Because of the multiple cesarean sections, he will  have to induce her to deliver but if she does develop any complications, to  make him aware of it and he will admit her to his service.   INFORMED CONSENT:  I advised the patient of the procedure and the reasons  why we are doing the procedure at this time.  She had not had a chance to  view the video in the office so the only consent form is obtained here in  the endoscopy unit.   PREOPERATIVE PREPARATION:  The patient was brought in the endoscopy range of  motion and [facility]. An IV for sedating medication was started.  A monitor was placed on the patient to monitor the patient's vital signs and  oxygen saturation.  Nasal oxygen at approximately 2 to 3 liters per minute  was used and sedation was performed and the procedure was begun (sedation  was light).   PROCEDURE NOTE:  With the patient lying in the left lateral position, the  instrument was inserted via the direct technique without difficulty.  The  oropharyngeal and epiglottis appeared to be unremarkable.  The vocal cords  were not visualized at this time.  The esophagus was normal without any evidence of  acute inflammation,  ulcerations, hiatal hernia or  varices appreciated.  As the instrument was  advanced more towards the distal portion of the esophagus, there was  evidence of reflux of debris that was present from the gastric area (The  patient stated that the lateral time she ate was approximately around 9  o'clock in the morning).   The gastric area showed evidence of increased amount of debris of food  material present in the gastric body at this time.  The instrument had a  difficult time initially advancing through because of the amount of debris  that was present.  Photographs were taken of this debris that was taken at  this time (most of the photographs were lost due to technical difficulties).  The instrument was advanced past the debris down towards the pyloric area.  The mucosal pattern showed no inflammatory changes that were present, and  again, no evidence of any coffee-ground material or bright bleeding was  appreciated.  The pylorus appeared to be patent and open and the instrument  was advanced into the small bowel area.  Again, there was increased amount  of food debris that was present but no other abnormalities that were noted.   The patient became more agitated with what could have been reflux of  material back up into the esophageal proper and possibly towards the throat  area.  She subsequently grabbed the instrument and subsequently attempted to  pull it out.  Based on the fear of the possibility of a complication that  could occur (such as aspiration) the instrument was removed quickly out of  the abdominal cavity and the oropharyngeal region.  The patient was  subsequently coughing and oral suction was applied to the area to remove any  debris that was refluxed back into the throat area at this time.  She made a  final comment that she could not breath at that point and subsequently,  gradually relaxed and went back to sleep at this time.  She tolerated the   procedure well without any major difficulties or complications.   TREATMENT:  1. I am going to ask for a chest x-ray on her to make sure there is no     evidence of any problems and a CBC to insure no leukocytosis at this     point.  2. She will be advised if she starts having a fever to notify me, that we     can repeat these tests and she may possibly need to be admitted to the     hospital for a possible pneumonia (though I think this would be less     likely).  3. Otherwise we will have follow-up with me in the office and I will notify     her gynecologist of the results of the procedure, such that if he has     proceed with a surgical inducement, for the cesarean section, he can     decide to that at his choice.   The time of the procedure today was 4:30 where the patient had not eaten  (she stated), since 9 o'clock.  I have considered to start her on Reglan but presently am going to hold this at the moment and I will check with her in  the a.m. to see how she is clinically doing.  Sharyn Dross., M.D.    JM/MEDQ  D:  10/26/2001  T:  10/28/2001  Job:  [id number]   cc:   Kathreen Cosier, M.D.  [address]  [place]  [place] [address]  Fax: [phone]

## 2010-05-21 NOTE — H&P (Signed)
[facility]  Patient:    Victoria Clayton               MRN: [id number] Adm. Date:  [id number] Attending:  Venita Sheffield                         History and Physical  HISTORY OF PRESENT ILLNESS:   Patient is a 30 year old gravida 3, para 1-0-1-1,  previous cesarean section, [facility] November 17, 1998.  She was admitted for induction on the night of November 24, 1998.  Cervix was a fingertip, soft, vertex -3. Negative strep.  She was started on Pitocin stimulation and it was noted that she had a reactive tracing, then by about 3 a.m., she started having variable decelerations with each contraction; sometimes there was a late component.  The  fetal heart rate had a baseline of 140 to 150.  By 6:35 a.m., she was 2 to 3 cm, 80%, vertex -2 to 3.  Amniotomy was performed; the fluid was clear.  She was on 20 mU of Pitocin at that time.  The patient had persistent lates and variables with each contraction and she was only 2 to 3 cm so it was decided she would be delivered by a repeat C-section because of non-reassuring fetal heart rate tracing. Baseline had also shifted from 150 to 110.  PHYSICAL EXAMINATION:  GENERAL:                      Physical exam revealed a well-developed female in  labor.  HEENT:                        Negative.  LUNGS:                        Clear.  HEART:                        Regular rhythm.  No murmurs.  No gallops.  ABDOMEN:                      Term-size uterus.  PELVIC:                       As described above.  EXTREMITIES:                  Negative. DD:  11/25/98 TD:  11/25/98 Job: [id number]

## 2010-05-21 NOTE — Discharge Summary (Signed)
[facility]  Patient:    Victoria Clayton               MRN: [id number] Adm. Date:  [id number] Disc. Date: 11/28/98 Attending:  Venita Sheffield                           Discharge Summary  HOSPITAL COURSE:              The patient is a 30 year old gravida 3, para 1-0-1-1, [facility] November 17, 1998. She had a previous C-section.  She is admitted fingertip, soft, 80% vertex, -3, for induction of labor and was started on Pitocin stimulation.  The patient got to 3 cm dilated and then developed variable and late decelerations.  With each contraction she had a baseline shift from 150 down to 110 and it was decided she would need repeat C-section because of nonreassuring fetal heart rate tracing.  She underwent a repeat low transverse cesarean section and had an 8 pounds female from the OP position.  Fluid was clear. Apgar scores of 8 and 9. Postoperatively the patient did well.  Her hemoglobin on admission was 10.8 and on discharge 9.2.  She was discharged home on the third postoperative day, ambulatory, on a regular diet, on Tylenol No. 3 one q.4h. p.r.n., to see me in six weeks.  DISCHARGE DIAGNOSIS:          Status post repeat low transverse cesarean section at term because of nonreassuring fetal heart rate tracing. DD:  11/28/98 TD:  11/28/98 Job: [id number]

## 2010-05-21 NOTE — Op Note (Signed)
[facility]  Patient:    Victoria Clayton               MRN: [id number] Proc. Date: 11/25/98 Adm. Date:  [id number] Attending:  Venita Sheffield                           Operative Report  PREOPERATIVE DIAGNOSIS:       Non-reassuring fetal heart rate tracing.  POSTOPERATIVE DIAGNOSIS:  OPERATION:  SURGEON:                      Kathreen Cosier, M.D.  ANESTHESIA:                   Spinal.  DESCRIPTION OF PROCEDURE:     Patient was placed on the operating table in a supine position, abdomen prepped and draped and bladder emptied with a Foley catheter.  Transverse suprapubic incision was made through the old scar and carried down to the rectus fascia, fascia cleaned and incised the length of the incision, rectus muscles retracted laterally and peritoneum incised longitudinally.  A transverse incision was made in the visceroperitoneum above the bladder and bladder mobilized inferiorly.  A transverse lower uterine incision was made.  Patient delivered from the OP position of a female, Apgar 9/9, the fluid was clear and baby weighed 8 pounds; the team was in attendance.  The placenta was anterior and removed manually.  Uterine cavity was cleaned with dry laps.  The uterine incision was closed in one layer with interlocking suture of #1 chromic, including myometrium and endometrium; the myometrium imbricated with #1 chromic.  The bladder flap was reattached with 2-0 chromic.  Uterus was well-contracted.  Tubes and ovaries were normal.  There was also a nuchal cord x 1.  Abdomen was closed in layers -- peritoneum with continuous suture of 0 chromic, fascia with a continuous suture of 0 Dexon and skin closed with subcuticular suture of 3-0 plain.  Blood loss was 00 cc.  Patient tolerated procedure well and taken to recovery room in good condition. DD:  11/25/98 TD:  11/27/98 Job: [id number]

## 2010-05-21 NOTE — Discharge Summary (Signed)
   NAME:  Victoria Clayton, Victoria Clayton                 ACCOUNT NO.:  [id number]   MEDICAL RECORD NO.:  [id number]                   PATIENT TYPE:  IPS   LOCATION:  0504                                 FACILITY:  [facility]   PHYSICIAN:  Jeanice Lim, M.D.              DATE OF BIRTH:  30-Nov-1980   DATE OF ADMISSION:  08/27/2001  DATE OF DISCHARGE:  08/28/2001                                 DISCHARGE SUMMARY   IDENTIFYING DATA:  This is a 30 year old African-American female,  involuntarily admitted, presenting in the emergency room [redacted] weeks pregnant,  frustrated, agitated with having to wait, threatened that if they did not  get the baby out or control the pain she would kill herself.  The patient  regretted having said this immediately and reports having been agitated but  denied any suicidal thoughts.   HOSPITAL COURSE:  Slept well, was appropriate in the hospital, using good  judgment and insight.  There was no acute dangerous ideation.  The patient  described healthier ways to cope with her complaints and a follow up plan  that was appropriate, following up with Dr. Gaynell Face and returning to the  [facility] for any complications.  The patient denied depressive  symptoms.   ALLERGIES:  No known drug allergies.   PHYSICAL EXAMINATION:  Essentially within normal limits.  Neurologically  nonfocal.   MENTAL STATUS EXAM:  Healthy appearing African-American female, fully alert,  cooperative, good eye contact.  Speech within normal limits.  Mood euthymic,  affect bright, thought process goal directed and appropriate.  Thought  content negative for dangerous ideation or psychotic symptoms.  Judgment and  insight were good.  Cognitively intact.   DISCHARGE DIAGNOSES:   AXIS I:  Adjustment disorder not otherwise specified.   AXIS II:  None.   AXIS III:  Gestation 27 weeks, anemia, history of vaginal bleeding.   AXIS IV:  Moderate, problems with primary support system.   AXIS V:   Global assessment of function on discharged 55/65.   DISCHARGE MEDICATIONS:  The patient was discharged on no psychotropics,  prenatal vitamins.   DISPOSITION:  Recommended to continue follow up with Ob-Gyn doctor.                                                Jeanice Lim, M.D.   JEM/MEDQ  D:  09/26/2001  T:  09/28/2001  Job:  [id number]

## 2010-10-17 ENCOUNTER — Inpatient Hospital Stay (HOSPITAL_COMMUNITY): Payer: Medicaid Other

## 2010-10-17 ENCOUNTER — Inpatient Hospital Stay (HOSPITAL_COMMUNITY)
Admission: AD | Admit: 2010-10-17 | Discharge: 2010-10-17 | Disposition: A | Discharge status: Home / Self Care | Payer: Medicaid Other | Source: Ambulatory Visit | Attending: Obstetrics & Gynecology | Admitting: Obstetrics & Gynecology

## 2010-10-17 ENCOUNTER — Encounter (HOSPITAL_COMMUNITY): Payer: Self-pay | Admitting: *Deleted

## 2010-10-17 DIAGNOSIS — N949 Unspecified condition associated with female genital organs and menstrual cycle: Principal | ICD-10-CM | POA: Insufficient documentation

## 2010-10-17 DIAGNOSIS — N939 Abnormal uterine and vaginal bleeding, unspecified: Secondary | ICD-10-CM

## 2010-10-17 DIAGNOSIS — N938 Other specified abnormal uterine and vaginal bleeding: Secondary | ICD-10-CM | POA: Insufficient documentation

## 2010-10-17 DIAGNOSIS — N898 Other specified noninflammatory disorders of vagina: Secondary | ICD-10-CM

## 2010-10-17 DIAGNOSIS — R109 Unspecified abdominal pain: Secondary | ICD-10-CM

## 2010-10-17 HISTORY — DX: Anxiety disorder, unspecified: F41.9

## 2010-10-17 HISTORY — DX: Benign neoplasm of connective and other soft tissue, unspecified: D21.9

## 2010-10-17 HISTORY — DX: Major depressive disorder, single episode, unspecified: F32.9

## 2010-10-17 HISTORY — DX: Depression, unspecified: F32.A

## 2010-10-17 LAB — CBC
MCH: 29.8 pg (ref 26.0–34.0)
MCHC: 33.1 g/dL (ref 30.0–36.0)
MCV: 90.2 fL (ref 78.0–100.0)
Platelets: 345 10*3/uL (ref 150–400)

## 2010-10-17 LAB — COMPREHENSIVE METABOLIC PANEL
ALT: 13 U/L (ref 0–35)
AST: 16 U/L (ref 0–37)
CO2: 28 mEq/L (ref 19–32)
Calcium: 9 mg/dL (ref 8.4–10.5)
Creatinine, Ser: 0.68 mg/dL (ref 0.50–1.10)
GFR calc Af Amer: >90 mL/min (ref >90)
GFR calc non Af Amer: >90 mL/min (ref >90)
Glucose, Bld: 97 mg/dL (ref 70–99)
Sodium: 136 mEq/L (ref 135–145)
Total Protein: 7 g/dL (ref 6.0–8.3)

## 2010-10-17 LAB — URINALYSIS, ROUTINE W REFLEX MICROSCOPIC
Bilirubin Urine: NEGATIVE
Nitrite: NEGATIVE
Specific Gravity, Urine: 1.02 (ref 1.005–1.030)
Urobilinogen, UA: 1 mg/dL (ref 0.0–1.0)
pH: 7 (ref 5.0–8.0)

## 2010-10-17 LAB — WET PREP, GENITAL
Trich, Wet Prep: NONE SEEN
Yeast Wet Prep HPF POC: NONE SEEN

## 2010-10-17 LAB — POCT PREGNANCY, URINE: Preg Test, Ur: NEGATIVE

## 2010-10-17 LAB — URINE MICROSCOPIC-ADD ON

## 2010-10-17 MED ORDER — KETOROLAC TROMETHAMINE 60 MG/2ML IM SOLN
60.0000 mg | Freq: Once | INTRAMUSCULAR | Status: AC
  Administered 2010-10-17: 60 mg via INTRAMUSCULAR
  Filled 2010-10-17: qty 2

## 2010-10-17 MED ORDER — NAPROXEN SODIUM 550 MG PO TABS: 550.0000 mg | ORAL_TABLET | Freq: Two times a day (BID) | ORAL | Status: DC

## 2010-10-17 NOTE — ED Provider Notes (Signed)
History   Pt presents today c/o severe pelvic pain and heavy vag bleeding that began last night. She states she has a sx of ovarian cysts and thinks she has another. She also c/o N&V. She denies fever, vag dc, irritation or any other problems. She was brought to the MAU via EMS.  Chief Complaint  Patient presents with  . Vaginal Bleeding   HPI  OB History    Grav Para Term Preterm Abortions TAB SAB Ect Mult Living   7 3 3  4 1 3          Past Medical History  Diagnosis Date  . Asthma   . Anxiety   . Fibroid   . Depression     Past Surgical History  Procedure Date  . Dilation and curettage of uterus   . Cesarean section   . Tubal ligation     No family history on file.  History  Substance Use Topics  . Smoking status: Current Some Day Smoker -- 1.0 packs/day for 12 years    Types: Cigarettes  . Smokeless tobacco: Not on file  . Alcohol Use: No    Allergies: No Known Allergies   (Not in a hospital admission)  Review of Systems  Constitutional: Negative for fever.  Cardiovascular: Negative for chest pain.  Gastrointestinal: Positive for nausea, vomiting and abdominal pain. Negative for diarrhea, constipation and blood in stool.  Genitourinary: Negative for dysuria, urgency, frequency and hematuria.  Neurological: Negative for dizziness and headaches.  Psychiatric/Behavioral: Negative for depression and suicidal ideas.   Physical Exam   Blood pressure 146/68, pulse 74, temperature 97.2 F (36.2 C), temperature source Oral, resp. rate 20, last menstrual period 09/30/2010.  Physical Exam  Nursing note and vitals reviewed. Constitutional: She is oriented to person, place, and time. She appears well-developed and well-nourished. No distress.  HENT:  Head: Normocephalic and atraumatic.  Eyes: EOM are normal. Pupils are equal, round, and reactive to light.  GI: Soft. She exhibits no distension and no mass. There is tenderness. There is no rebound and no guarding.    Genitourinary: There is bleeding around the vagina. No vaginal discharge found.       Uterus NL size and shape. No adnexal masses. Uterus is tender to palpation. Exam is difficult secondary to increased body habitus.  Neurological: She is alert and oriented to person, place, and time.  Skin: Skin is warm and dry. She is not diaphoretic.  Psychiatric: She has a normal mood and affect. Her behavior is normal. Judgment and thought content normal.    MAU Course  Procedures  Wet prep and GC/Chlamydia cultures done.  US Transvaginal Non-ob  10/17/2010  *RADIOLOGY REPORT*  Clinical Data: Right lower quadrant pain with dysfunctional uterine bleeding.  TRANSABDOMINAL AND TRANSVAGINAL ULTRASOUND OF PELVIS Technique:  Both transabdominal and transvaginal ultrasound examinations of the pelvis were performed. Transabdominal technique was performed for global imaging of the pelvis including uterus, ovaries, adnexal regions, and pelvic cul-de-sac.  Comparison: 02/24/2010   It was necessary to proceed with endovaginal exam following the transabdominal exam to visualize the endometrium.  Findings:  Uterus: 8.7 x 4.5 x 5.4 cm.  Sonographically normal.  Endometrium: 5 mm in thickness.  Right ovary:  2.02 x 2 x 2.3 cm.  10 mm hemorrhagic follicle noted within the parenchyma.  Left ovary: 3.2 x 1.6 x 2.8 cm.  Sonographically normal.  Other findings: No evidence for free fluid in the cul-de-sac.  IMPRESSION: Unremarkable pelvic ultrasound.  Original Report  Authenticated By: ERIC A. MANSELL, M.D.    Results for orders placed during the hospital encounter of 10/17/10 (from the past 24 hour(s))  CBC     Status: Normal   Collection Time   10/17/10  1:31 PM      Component Value Range   WBC 7.4  4.0 - 10.5 (K/uL)   RBC 4.29  3.87 - 5.11 (MIL/uL)   Hemoglobin 12.8  12.0 - 15.0 (g/dL)   HCT 16.1  09.6 - 04.5 (%)   MCV 90.2  78.0 - 100.0 (fL)   MCH 29.8  26.0 - 34.0 (pg)   MCHC 33.1  30.0 - 36.0 (g/dL)   RDW 40.9   81.1 - 91.4 (%)   Platelets 345  150 - 400 (K/uL)  COMPREHENSIVE METABOLIC PANEL     Status: Abnormal   Collection Time   10/17/10  1:31 PM      Component Value Range   Sodium 136  135 - 145 (mEq/L)   Potassium 4.1  3.5 - 5.1 (mEq/L)   Chloride 102  96 - 112 (mEq/L)   CO2 28  19 - 32 (mEq/L)   Glucose, Bld 97  70 - 99 (mg/dL)   BUN 9  6 - 23 (mg/dL)   Creatinine, Ser 7.82  0.50 - 1.10 (mg/dL)   Calcium 9.0  8.4 - 95.6 (mg/dL)   Total Protein 7.0  6.0 - 8.3 (g/dL)   Albumin 3.7  3.5 - 5.2 (g/dL)   AST 16  0 - 37 (U/L)   ALT 13  0 - 35 (U/L)   Alkaline Phosphatase 82  39 - 117 (U/L)   Total Bilirubin 0.2 (*) 0.3 - 1.2 (mg/dL)   GFR calc non Af Amer >90  >90 (mL/min)   GFR calc Af Amer >90  >90 (mL/min)  URINALYSIS, ROUTINE W REFLEX MICROSCOPIC     Status: Abnormal   Collection Time   10/17/10  1:45 PM      Component Value Range   Color, Urine RED (*) YELLOW    Appearance CLOUDY (*) CLEAR    Specific Gravity, Urine 1.020  1.005 - 1.030    pH 7.0  5.0 - 8.0    Glucose, UA NEGATIVE  NEGATIVE (mg/dL)   Hgb urine dipstick LARGE (*) NEGATIVE    Bilirubin Urine NEGATIVE  NEGATIVE    Ketones, ur 15 (*) NEGATIVE (mg/dL)   Protein, ur 30 (*) NEGATIVE (mg/dL)   Urobilinogen, UA 1.0  0.0 - 1.0 (mg/dL)   Nitrite NEGATIVE  NEGATIVE    Leukocytes, UA TRACE (*) NEGATIVE   URINE MICROSCOPIC-ADD ON     Status: Normal   Collection Time   10/17/10  1:45 PM      Component Value Range   Squamous Epithelial / LPF RARE  RARE    RBC / HPF TOO NUMEROUS TO COUNT  <3 (RBC/hpf)  POCT PREGNANCY, URINE     Status: Normal   Collection Time   10/17/10  1:51 PM      Component Value Range   Preg Test, Ur NEGATIVE    WET PREP, GENITAL     Status: Normal   Collection Time   10/17/10  2:26 PM      Component Value Range   Yeast, Wet Prep NONE SEEN  NONE SEEN    Trich, Wet Prep NONE SEEN  NONE SEEN    Clue Cells, Wet Prep NONE SEEN  NONE SEEN    WBC, Wet Prep HPF POC NONE SEEN  NONE SEEN    Urine  culture sent. Assessment and Plan  Abd pain and vag bleeding: pt is most likely having her menses at this time. Will give Rx for anaprox DS. She will f/u with her PCP. Discussed diet, activity, risks, and precautions.  Clinton Gallant. Rice III, DrHSc, MPAS, PA-C  10/17/2010, 2:34 PM   Henrietta Hoover, PA 10/17/10 [phone]

## 2010-10-17 NOTE — Progress Notes (Addendum)
Pt reports vaginal bleeding for the last 24 hours. The bleeding increased this morning and pt reports changing her pad 4 times in the last hour. Pt reports that she has "fibroid cysts on her ovaries" Pain scale 9/10

## 2010-10-18 LAB — URINE CULTURE
Colony Count: >=100000
Culture  Setup Time: 201210142110

## 2010-10-18 LAB — GC/CHLAMYDIA PROBE AMP, GENITAL: GC Probe Amp, Genital: NEGATIVE

## 2010-10-19 LAB — RAPID URINE DRUG SCREEN, HOSP PERFORMED
Benzodiazepines: NOT DETECTED
Cocaine: POSITIVE — AB
Opiates: NOT DETECTED
Tetrahydrocannabinol: POSITIVE — AB

## 2010-10-19 LAB — DIFFERENTIAL
Basophils Relative: 0
Lymphocytes Relative: 35
Lymphs Abs: 3.5 — ABNORMAL HIGH
Monocytes Absolute: 0.7
Monocytes Relative: 7
Neutro Abs: 5.7
Neutrophils Relative %: 57

## 2010-10-19 LAB — CBC
Hemoglobin: 14.1
MCHC: 34.6
RBC: 4.55
WBC: 10.1

## 2010-10-19 LAB — URINALYSIS, ROUTINE W REFLEX MICROSCOPIC
Bilirubin Urine: NEGATIVE
Glucose, UA: NEGATIVE
Ketones, ur: NEGATIVE
Protein, ur: NEGATIVE
pH: 6.5

## 2010-10-19 LAB — HEPATIC FUNCTION PANEL
AST: 25
Bilirubin, Direct: 0.1
Indirect Bilirubin: 0.5
Total Bilirubin: 0.6

## 2010-10-19 LAB — BASIC METABOLIC PANEL
CO2: 24
Calcium: 9.4
Creatinine, Ser: 0.71
GFR calc Af Amer: >60
GFR calc non Af Amer: >60
Sodium: 139

## 2010-10-19 LAB — ETHANOL: Alcohol, Ethyl (B): <5

## 2010-10-19 LAB — URINE MICROSCOPIC-ADD ON

## 2010-10-19 LAB — SALICYLATE LEVEL: Salicylate Lvl: <4

## 2011-02-15 ENCOUNTER — Encounter (HOSPITAL_COMMUNITY): Payer: Self-pay | Admitting: *Deleted

## 2011-02-15 ENCOUNTER — Inpatient Hospital Stay (HOSPITAL_COMMUNITY): Payer: Medicaid Other

## 2011-02-15 ENCOUNTER — Inpatient Hospital Stay (HOSPITAL_COMMUNITY)
Admission: AD | Admit: 2011-02-15 | Discharge: 2011-02-15 | Disposition: A | Discharge status: Home / Self Care | Payer: Medicaid Other | Source: Ambulatory Visit | Attending: Obstetrics and Gynecology | Admitting: Obstetrics and Gynecology

## 2011-02-15 DIAGNOSIS — N949 Unspecified condition associated with female genital organs and menstrual cycle: Secondary | ICD-10-CM | POA: Insufficient documentation

## 2011-02-15 DIAGNOSIS — M549 Dorsalgia, unspecified: Secondary | ICD-10-CM | POA: Insufficient documentation

## 2011-02-15 DIAGNOSIS — J45909 Unspecified asthma, uncomplicated: Secondary | ICD-10-CM | POA: Insufficient documentation

## 2011-02-15 DIAGNOSIS — F17200 Nicotine dependence, unspecified, uncomplicated: Secondary | ICD-10-CM | POA: Insufficient documentation

## 2011-02-15 DIAGNOSIS — N898 Other specified noninflammatory disorders of vagina: Secondary | ICD-10-CM | POA: Insufficient documentation

## 2011-02-15 DIAGNOSIS — R109 Unspecified abdominal pain: Secondary | ICD-10-CM | POA: Insufficient documentation

## 2011-02-15 DIAGNOSIS — R110 Nausea: Secondary | ICD-10-CM | POA: Insufficient documentation

## 2011-02-15 DIAGNOSIS — N946 Dysmenorrhea, unspecified: Secondary | ICD-10-CM

## 2011-02-15 HISTORY — DX: Reserved for concepts with insufficient information to code with codable children: IMO0002

## 2011-02-15 HISTORY — DX: Unspecified ovarian cyst, unspecified side: N83.209

## 2011-02-15 HISTORY — DX: Unspecified abnormal cytological findings in specimens from cervix uteri: R87.619

## 2011-02-15 LAB — COMPREHENSIVE METABOLIC PANEL
ALT: 19 U/L (ref 0–35)
AST: 18 U/L (ref 0–37)
Albumin: 3.6 g/dL (ref 3.5–5.2)
Alkaline Phosphatase: 78 U/L (ref 39–117)
Glucose, Bld: 99 mg/dL (ref 70–99)
Potassium: 4.2 mEq/L (ref 3.5–5.1)
Sodium: 138 mEq/L (ref 135–145)
Total Protein: 7.3 g/dL (ref 6.0–8.3)

## 2011-02-15 LAB — CBC
HCT: 40.3 % (ref 36.0–46.0)
Hemoglobin: 13.1 g/dL (ref 12.0–15.0)
MCHC: 32.5 g/dL (ref 30.0–36.0)
MCV: 91 fL (ref 78.0–100.0)
WBC: 8.5 10*3/uL (ref 4.0–10.5)

## 2011-02-15 LAB — DIFFERENTIAL
Basophils Absolute: 0 10*3/uL (ref 0.0–0.1)
Basophils Relative: 0 % (ref 0–1)
Eosinophils Absolute: 0.2 10*3/uL (ref 0.0–0.7)
Lymphs Abs: 3.8 10*3/uL (ref 0.7–4.0)

## 2011-02-15 LAB — WET PREP, GENITAL

## 2011-02-15 MED ORDER — HYDROMORPHONE HCL PF 1 MG/ML IJ SOLN
0.5000 mg | Freq: Once | INTRAMUSCULAR | Status: AC
  Administered 2011-02-15: 0.5 mg via INTRAVENOUS
  Filled 2011-02-15: qty 1

## 2011-02-15 MED ORDER — ONDANSETRON HCL 4 MG/2ML IJ SOLN
4.0000 mg | Freq: Once | INTRAMUSCULAR | Status: AC
  Administered 2011-02-15: 4 mg via INTRAVENOUS
  Filled 2011-02-15: qty 2

## 2011-02-15 MED ORDER — SODIUM CHLORIDE 0.9 % IV SOLN
INTRAVENOUS | Status: DC
  Administered 2011-02-15: 14:00:00 via INTRAVENOUS

## 2011-02-15 MED ORDER — HYDROCODONE-ACETAMINOPHEN 5-325 MG PO TABS: 2.0000 | ORAL_TABLET | ORAL | Status: AC | PRN

## 2011-02-15 MED ORDER — KETOROLAC TROMETHAMINE 30 MG/ML IJ SOLN
30.0000 mg | Freq: Once | INTRAMUSCULAR | Status: AC
  Administered 2011-02-15: 30 mg via INTRAVENOUS
  Filled 2011-02-15: qty 1

## 2011-02-15 NOTE — Progress Notes (Signed)
Pt states hx ovarian cysts, has been hospitalized prior, last time 02/2010. Notes bleeding intermittently, golfball sized clots. Denies vag d/c changes.

## 2011-02-15 NOTE — ED Provider Notes (Signed)
History     CSN: [id number]  Arrival date & time 02/15/11  1249   None     Chief Complaint  Patient presents with  . Ovarian Cyst    HPI Victoria Clayton is a 31 y.o. female who presents to MAU for severe lower abdominal cramping. BTL for birth control but also takes OC to try and help with pelvic pain with periods and with ovarian cysts. Onset of bleeding last pm and has increased today and passing clots. Unsure of last pap smear but thinks 2003. No GYN only [facility]. The history was provided by the patient.  Past Medical History  Diagnosis Date  . Asthma   . Anxiety   . Fibroid   . Depression   . Ovarian cyst   . Abnormal Pap smear     Past Surgical History  Procedure Date  . Dilation and curettage of uterus   . Cesarean section   . Tubal ligation     Family History  Problem Relation Age of Onset  . Anesthesia problems Neg Hx     History  Substance Use Topics  . Smoking status: Current Some Day Smoker -- 1.0 packs/day for 12 years    Types: Cigarettes  . Smokeless tobacco: Not on file  . Alcohol Use: No    OB History    Grav Para Term Preterm Abortions TAB SAB Ect Mult Living   7 3 3  4 1 3          Review of Systems  Gastrointestinal: Positive for nausea and abdominal pain.  Genitourinary: Positive for vaginal bleeding and pelvic pain.  Musculoskeletal: Positive for back pain.    Allergies  Review of patient's allergies indicates no known allergies.  Home Medications   Current Outpatient Rx  Name Route Sig Dispense Refill  . HYDROCODONE-ACETAMINOPHEN 5-325 MG PO TABS Oral Take 2 tablets by mouth every 4 (four) hours as needed for pain. 10 tablet 0    BP 113/67  Pulse 67  Temp(Src) 98.8 F (37.1 C) (Oral)  Resp 20  Ht 5\' 6"  (1.676 m)  Wt 223 lb (101.152 kg)  BMI 35.99 kg/m2  SpO2 99%  LMP 02/01/2011  Physical Exam  Nursing note and vitals reviewed. Constitutional: She is oriented to person, place, and time. She  appears well-developed and well-nourished.  HENT:  Head: Normocephalic.  Eyes: EOM are normal.  Neck: Neck supple.  Cardiovascular: Normal rate.   Pulmonary/Chest: Effort normal.  Abdominal: Soft. There is no tenderness.  Genitourinary:       External genitalia without lesions. Moderate blood vaginal vault. Positive CMT, bilateral adnexal tenderness. Unable to palpate uterus due to patient habitus.  Musculoskeletal: Normal range of motion.  Neurological: She is alert and oriented to person, place, and time. No cranial nerve deficit.  Skin: Skin is warm and dry.  Psychiatric: Her behavior is normal. Judgment and thought content normal. Her mood appears anxious.   Assessment: Pelvic pain   Vaginal bleeding  Plan:  Toradol 30 mg IV   Zofran 4 mg IV  Patient was feeling much better after Toradol and then when returned from ultrasound states that the vaginal probe caused the pain to return.  Patient given Dilaudid 0.5 mg IV.  Results for orders placed during the hospital encounter of 02/15/11 (from the past 24 hour(s))  CBC     Status: Normal   Collection Time   02/15/11  1:10 PM      Component Value Range  WBC 8.5  4.0 - 10.5 (K/uL)   RBC 4.43  3.87 - 5.11 (MIL/uL)   Hemoglobin 13.1  12.0 - 15.0 (g/dL)   HCT 16.1  09.6 - 04.5 (%)   MCV 91.0  78.0 - 100.0 (fL)   MCH 29.6  26.0 - 34.0 (pg)   MCHC 32.5  30.0 - 36.0 (g/dL)   RDW 40.9  81.1 - 91.4 (%)   Platelets 350  150 - 400 (K/uL)  DIFFERENTIAL     Status: Normal   Collection Time   02/15/11  1:10 PM      Component Value Range   Neutrophils Relative 47  43 - 77 (%)   Neutro Abs 4.0  1.7 - 7.7 (K/uL)   Lymphocytes Relative 44  12 - 46 (%)   Lymphs Abs 3.8  0.7 - 4.0 (K/uL)   Monocytes Relative 6  3 - 12 (%)   Monocytes Absolute 0.5  0.1 - 1.0 (K/uL)   Eosinophils Relative 3  0 - 5 (%)   Eosinophils Absolute 0.2  0.0 - 0.7 (K/uL)   Basophils Relative 0  0 - 1 (%)   Basophils Absolute 0.0  0.0 - 0.1 (K/uL)  COMPREHENSIVE  METABOLIC PANEL     Status: Normal   Collection Time   02/15/11  1:10 PM      Component Value Range   Sodium 138  135 - 145 (mEq/L)   Potassium 4.2  3.5 - 5.1 (mEq/L)   Chloride 104  96 - 112 (mEq/L)   CO2 25  19 - 32 (mEq/L)   Glucose, Bld 99  70 - 99 (mg/dL)   BUN 9  6 - 23 (mg/dL)   Creatinine, Ser 7.82  0.50 - 1.10 (mg/dL)   Calcium 8.7  8.4 - 95.6 (mg/dL)   Total Protein 7.3  6.0 - 8.3 (g/dL)   Albumin 3.6  3.5 - 5.2 (g/dL)   AST 18  0 - 37 (U/L)   ALT 19  0 - 35 (U/L)   Alkaline Phosphatase 78  39 - 117 (U/L)   Total Bilirubin 0.3  0.3 - 1.2 (mg/dL)   GFR calc non Af Amer >90  >90 (mL/min)   GFR calc Af Amer >90  >90 (mL/min)  WET PREP, GENITAL     Status: Abnormal   Collection Time   02/15/11  2:55 PM      Component Value Range   Yeast Wet Prep HPF POC NONE SEEN  NONE SEEN    Trich, Wet Prep NONE SEEN  NONE SEEN    Clue Cells Wet Prep HPF POC NONE SEEN  NONE SEEN    WBC, Wet Prep HPF POC FEW (*) NONE SEEN    ED Course  ProceduresUs Transvaginal Non-ob  02/15/2011  *RADIOLOGY REPORT*  Clinical Data: Pain and vaginal bleeding  TRANSABDOMINAL AND TRANSVAGINAL ULTRASOUND OF PELVIS Technique:  Both transabdominal and transvaginal ultrasound examinations of the pelvis were performed. Transabdominal technique was performed for global imaging of the pelvis including uterus, ovaries, adnexal regions, and pelvic cul-de-sac.  Comparison: October 17, 2010   It was necessary to proceed with endovaginal exam following the transabdominal exam to visualize the endometrium and adnexa.  Findings:  The uterus has a normal size and echotexture, measuring 8.7 x 4.8 x 4.8 cm.  Endometrial stripe is thin and homogeneous, measuring 5 mm in width.  Both ovaries have a normal size and appearance.  The right ovary measures 2.3 x 1.7 x 1.4 cm, and the left  ovary measures 2.4 x 2.1 x 1.4 cm.  There are no adnexal masses or free pelvic fluid.  IMPRESSION: Normal study. No evidence of pelvic mass or other  significant abnormality.  Original Report Authenticated By: Brandon Melnick, M.D.   US Pelvis Complete  02/15/2011  *RADIOLOGY REPORT*  Clinical Data: Pain and vaginal bleeding  TRANSABDOMINAL AND TRANSVAGINAL ULTRASOUND OF PELVIS Technique:  Both transabdominal and transvaginal ultrasound examinations of the pelvis were performed. Transabdominal technique was performed for global imaging of the pelvis including uterus, ovaries, adnexal regions, and pelvic cul-de-sac.  Comparison: October 17, 2010   It was necessary to proceed with endovaginal exam following the transabdominal exam to visualize the endometrium and adnexa.  Findings:  The uterus has a normal size and echotexture, measuring 8.7 x 4.8 x 4.8 cm.  Endometrial stripe is thin and homogeneous, measuring 5 mm in width.  Both ovaries have a normal size and appearance.  The right ovary measures 2.3 x 1.7 x 1.4 cm, and the left ovary measures 2.4 x 2.1 x 1.4 cm.  There are no adnexal masses or free pelvic fluid.  IMPRESSION: Normal study. No evidence of pelvic mass or other significant abnormality.  Original Report Authenticated By: Brandon Melnick, M.D.   Note: Since all labs and ultrasound are normal will treat symptoms and have patient follow up in the GYN Clinic for pap smear and pelvic pain. MDM          Kerrie Buffalo, NP 02/15/11 1535

## 2011-02-16 LAB — GC/CHLAMYDIA PROBE AMP, GENITAL: Chlamydia, DNA Probe: NEGATIVE

## 2011-02-18 NOTE — ED Provider Notes (Signed)
Agree with above note.  Victoria Clayton 02/18/2011 7:04 AM

## 2012-03-27 ENCOUNTER — Emergency Department (HOSPITAL_COMMUNITY): Payer: Medicaid Other

## 2012-03-27 ENCOUNTER — Emergency Department (HOSPITAL_COMMUNITY)
Admission: EM | Admit: 2012-03-27 | Discharge: 2012-03-27 | Disposition: A | Discharge status: Home / Self Care | Payer: Medicaid Other | Attending: Emergency Medicine | Admitting: Emergency Medicine

## 2012-03-27 DIAGNOSIS — W08XXXA Fall from other furniture, initial encounter: Secondary | ICD-10-CM | POA: Insufficient documentation

## 2012-03-27 DIAGNOSIS — Y9389 Activity, other specified: Secondary | ICD-10-CM | POA: Insufficient documentation

## 2012-03-27 DIAGNOSIS — S99929A Unspecified injury of unspecified foot, initial encounter: Secondary | ICD-10-CM | POA: Insufficient documentation

## 2012-03-27 DIAGNOSIS — Y929 Unspecified place or not applicable: Secondary | ICD-10-CM | POA: Insufficient documentation

## 2012-03-27 DIAGNOSIS — Z8659 Personal history of other mental and behavioral disorders: Secondary | ICD-10-CM | POA: Insufficient documentation

## 2012-03-27 DIAGNOSIS — S8990XA Unspecified injury of unspecified lower leg, initial encounter: Secondary | ICD-10-CM | POA: Insufficient documentation

## 2012-03-27 DIAGNOSIS — J45909 Unspecified asthma, uncomplicated: Secondary | ICD-10-CM | POA: Insufficient documentation

## 2012-03-27 DIAGNOSIS — Z8742 Personal history of other diseases of the female genital tract: Secondary | ICD-10-CM | POA: Insufficient documentation

## 2012-03-27 DIAGNOSIS — F17200 Nicotine dependence, unspecified, uncomplicated: Secondary | ICD-10-CM | POA: Insufficient documentation

## 2012-03-27 DIAGNOSIS — S99912A Unspecified injury of left ankle, initial encounter: Secondary | ICD-10-CM

## 2012-03-27 MED ORDER — OXYCODONE-ACETAMINOPHEN 5-325 MG PO TABS: 2.0000 | ORAL_TABLET | ORAL | Status: DC | PRN

## 2012-03-27 MED ORDER — OXYCODONE-ACETAMINOPHEN 5-325 MG PO TABS
2.0000 | ORAL_TABLET | Freq: Once | ORAL | Status: AC
  Administered 2012-03-27: 2 via ORAL
  Filled 2012-03-27: qty 2

## 2012-03-27 MED ORDER — IBUPROFEN 600 MG PO TABS: 600.0000 mg | ORAL_TABLET | Freq: Four times a day (QID) | ORAL | Status: DC | PRN

## 2012-03-27 MED ORDER — KETOROLAC TROMETHAMINE 30 MG/ML IJ SOLN
30.0000 mg | Freq: Once | INTRAMUSCULAR | Status: AC
  Administered 2012-03-27: 30 mg via INTRAMUSCULAR
  Filled 2012-03-27: qty 1

## 2012-03-27 NOTE — ED Notes (Signed)
RUE:[id number]<JW> Expected date:03/27/12<BR> Expected time: 7:59 PM<BR> Means of arrival:Ambulance<BR> Comments:<BR> Fall-ankle injury
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
## 2012-03-27 NOTE — ED Provider Notes (Signed)
History     CSN: [id number]  Arrival date & time 03/27/12  2012   First MD Initiated Contact with Patient 03/27/12 2028      Chief Complaint  Patient presents with  . Fall  . Ankle Injury    (Consider location/radiation/quality/duration/timing/severity/associated sxs/prior treatment) HPI Comments: Patient states she was standing on an ottoman  When she stepped down with her left foot she lost her balance and she is unsure how she injured her right foot, but now is complaining of right ankle pain that radiates into her calf.  Pain is excruciating and stabbing.  She has not taken any over-the-counter medication.  Prior to arrival, but she did apply ice.  Patient is a 32 y.o. female presenting with fall and lower extremity injury. The history is provided by the patient.  Fall The accident occurred 1 to 2 hours ago. The fall occurred from a stool. She fell from a height of 1 to 2 ft. She landed on a hard floor. There was no blood loss. Point of impact: R foot/shin. Pain location: R ankle/shin/calf. The pain is at a severity of 9/10. The pain is severe. She was not ambulatory at the scene. There was no entrapment after the fall. There was no drug use involved in the accident. There was no alcohol use involved in the accident. She has tried ice for the symptoms. The treatment provided no relief.  Ankle Injury Pertinent negatives include no joint swelling.    Past Medical History  Diagnosis Date  . Asthma   . Anxiety   . Fibroid   . Depression   . Ovarian cyst   . Abnormal Pap smear     Past Surgical History  Procedure Laterality Date  . Dilation and curettage of uterus    . Cesarean section    . Tubal ligation      Family History  Problem Relation Age of Onset  . Anesthesia problems Neg Hx     History  Substance Use Topics  . Smoking status: Current Some Day Smoker -- 1.00 packs/day for 12 years    Types: Cigarettes  . Smokeless tobacco: Not on file  . Alcohol Use: No     OB History   Grav Para Term Preterm Abortions TAB SAB Ect Mult Living   7 3 3  4 1 3          Review of Systems  Constitutional: Positive for activity change.  Musculoskeletal: Negative for joint swelling.  Skin: Negative for pallor and wound.  All other systems reviewed and are negative.    Allergies  Review of patient's allergies indicates no known allergies.  Home Medications   Current Outpatient Rx  Name  Route  Sig  Dispense  Refill  . ibuprofen (ADVIL,MOTRIN) 600 MG tablet   Oral   Take 1 tablet (600 mg total) by mouth every 6 (six) hours as needed for pain.   30 tablet   0   . oxyCODONE-acetaminophen (PERCOCET/ROXICET) 5-325 MG per tablet   Oral   Take 2 tablets by mouth every 4 (four) hours as needed for pain.   30 tablet   0     BP 117/92  Pulse 93  Temp(Src) 97.9 F (36.6 C) (Oral)  SpO2 99%  Physical Exam  Nursing note reviewed. Constitutional: She is oriented to person, place, and time. She appears well-developed and well-nourished.  HENT:  Head: Normocephalic.  Eyes: Pupils are equal, round, and reactive to light.  Neck: Normal range of  motion.  Cardiovascular: Normal rate.   Musculoskeletal: She exhibits tenderness. She exhibits no edema.       Right lower leg: She exhibits tenderness. She exhibits no swelling, no edema and no deformity.       Legs:      Feet:  Achilles intact  Neurological: She is alert and oriented to person, place, and time.  Skin: Skin is warm and dry.    ED Course  Procedures (including critical care time)  Labs Reviewed - No data to display Dg Tibia/fibula Right  03/27/2012  *RADIOLOGY REPORT*  Clinical Data: Larey Seat.  Right leg pain.  RIGHT TIBIA AND FIBULA - 2 VIEW  Comparison: None  Findings: The knee and ankle joints are maintained.  No acute fracture of the tibia or fibula is identified.  IMPRESSION: No acute bony findings.   Original Report Authenticated By: Rudie Meyer, M.D.    Dg Ankle Complete  Right  03/27/2012  *RADIOLOGY REPORT*  Clinical Data: The right ankle pain.  RIGHT ANKLE - COMPLETE 3+ VIEW  Comparison: None  Findings: The ankle mortise is maintained.  No acute ankle fracture or osteochondral lesion.  The mid and hind foot bony structures are intact.  IMPRESSION: No acute bony findings.   Original Report Authenticated By: Rudie Meyer, M.D.      1. Ankle injury, left, initial encounter       MDM   X-rays are normal.  Patient has been placed in an Ace bandage, today.  ASO, given crutches, or assisted her with ambulation, and comfort.  She's been referred to Dr. Ophelia Charter for which pain        Arman Filter, NP 03/27/12 2253

## 2012-03-27 NOTE — ED Provider Notes (Signed)
Medical screening examination/treatment/procedure(s) were performed by non-physician practitioner and as supervising physician I was immediately available for consultation/collaboration.  Takeya Marquis R. Ilani Otterson, MD 03/27/12 2307 

## 2012-07-26 ENCOUNTER — Emergency Department (INDEPENDENT_AMBULATORY_CARE_PROVIDER_SITE_OTHER): Payer: Medicaid Other

## 2012-07-26 ENCOUNTER — Encounter (HOSPITAL_COMMUNITY): Payer: Self-pay | Admitting: Emergency Medicine

## 2012-07-26 ENCOUNTER — Emergency Department (INDEPENDENT_AMBULATORY_CARE_PROVIDER_SITE_OTHER)
Admission: EM | Admit: 2012-07-26 | Discharge: 2012-07-26 | Disposition: A | Discharge status: Home / Self Care | Payer: Medicaid Other | Source: Home / Self Care

## 2012-07-26 DIAGNOSIS — S93409A Sprain of unspecified ligament of unspecified ankle, initial encounter: Secondary | ICD-10-CM

## 2012-07-26 DIAGNOSIS — S93402A Sprain of unspecified ligament of left ankle, initial encounter: Secondary | ICD-10-CM

## 2012-07-26 MED ORDER — HYDROCODONE-ACETAMINOPHEN 5-325 MG PO TABS: 1.0000 | ORAL_TABLET | Freq: Three times a day (TID) | ORAL | Status: DC | PRN
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
## 2012-07-26 NOTE — ED Notes (Signed)
Pt requested an ice pack - small pack filled w/ ice & given to the pt.
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
## 2012-07-30 NOTE — ED Provider Notes (Signed)
Medical screening examination/treatment/procedure(s) were performed by resident physician or non-physician practitioner and as supervising physician I was immediately available for consultation/collaboration.   Barkley Bruns MD.   Linna Hoff, MD 07/30/12 2149

## 2012-10-13 ENCOUNTER — Emergency Department (HOSPITAL_COMMUNITY): Payer: Medicaid Other

## 2012-10-13 ENCOUNTER — Encounter (HOSPITAL_COMMUNITY): Payer: Self-pay | Admitting: Emergency Medicine

## 2012-10-13 ENCOUNTER — Emergency Department (HOSPITAL_COMMUNITY)
Admission: EM | Admit: 2012-10-13 | Discharge: 2012-10-13 | Disposition: A | Discharge status: Home / Self Care | Payer: Medicaid Other | Attending: Emergency Medicine | Admitting: Emergency Medicine

## 2012-10-13 DIAGNOSIS — J45909 Unspecified asthma, uncomplicated: Secondary | ICD-10-CM | POA: Insufficient documentation

## 2012-10-13 DIAGNOSIS — R110 Nausea: Secondary | ICD-10-CM | POA: Insufficient documentation

## 2012-10-13 DIAGNOSIS — F3289 Other specified depressive episodes: Secondary | ICD-10-CM | POA: Insufficient documentation

## 2012-10-13 DIAGNOSIS — Z8742 Personal history of other diseases of the female genital tract: Secondary | ICD-10-CM | POA: Insufficient documentation

## 2012-10-13 DIAGNOSIS — F17200 Nicotine dependence, unspecified, uncomplicated: Secondary | ICD-10-CM | POA: Insufficient documentation

## 2012-10-13 DIAGNOSIS — F329 Major depressive disorder, single episode, unspecified: Secondary | ICD-10-CM | POA: Insufficient documentation

## 2012-10-13 DIAGNOSIS — R109 Unspecified abdominal pain: Secondary | ICD-10-CM

## 2012-10-13 DIAGNOSIS — F411 Generalized anxiety disorder: Secondary | ICD-10-CM | POA: Insufficient documentation

## 2012-10-13 LAB — CBC WITH DIFFERENTIAL/PLATELET
Basophils Absolute: 0.1 10*3/uL (ref 0.0–0.1)
Basophils Relative: 1 % (ref 0–1)
Eosinophils Absolute: 0.1 K/uL (ref 0.0–0.7)
Eosinophils Relative: 1 % (ref 0–5)
HCT: 41.8 % (ref 36.0–46.0)
Hemoglobin: 14.2 g/dL (ref 12.0–15.0)
Lymphocytes Relative: 45 % (ref 12–46)
Lymphs Abs: 4.9 K/uL — ABNORMAL HIGH (ref 0.7–4.0)
MCH: 29.5 pg (ref 26.0–34.0)
MCHC: 34 g/dL (ref 30.0–36.0)
MCV: 86.9 fL (ref 78.0–100.0)
Monocytes Absolute: 0.8 10*3/uL (ref 0.1–1.0)
Monocytes Relative: 7 % (ref 3–12)
Neutro Abs: 5.1 10*3/uL (ref 1.7–7.7)
Neutrophils Relative %: 47 % (ref 43–77)
Platelets: 360 K/uL (ref 150–400)
RBC: 4.81 MIL/uL (ref 3.87–5.11)
RDW: 14 % (ref 11.5–15.5)
WBC: 11 K/uL — ABNORMAL HIGH (ref 4.0–10.5)

## 2012-10-13 LAB — COMPREHENSIVE METABOLIC PANEL WITH GFR
ALT: 15 U/L (ref 0–35)
Alkaline Phosphatase: 81 U/L (ref 39–117)
BUN: 7 mg/dL (ref 6–23)
CO2: 20 meq/L (ref 19–32)
Calcium: 9.9 mg/dL (ref 8.4–10.5)
GFR calc Af Amer: >90 mL/min (ref >90)
GFR calc non Af Amer: >90 mL/min (ref >90)
Glucose, Bld: 112 mg/dL — ABNORMAL HIGH (ref 70–99)
Sodium: 135 meq/L (ref 135–145)
Total Protein: 8 g/dL (ref 6.0–8.3)

## 2012-10-13 LAB — COMPREHENSIVE METABOLIC PANEL
AST: 16 U/L (ref 0–37)
Albumin: 4 g/dL (ref 3.5–5.2)
Chloride: 102 mEq/L (ref 96–112)
Creatinine, Ser: 0.71 mg/dL (ref 0.50–1.10)
Potassium: 3.8 mEq/L (ref 3.5–5.1)
Total Bilirubin: 0.3 mg/dL (ref 0.3–1.2)

## 2012-10-13 LAB — URINALYSIS, ROUTINE W REFLEX MICROSCOPIC
Bilirubin Urine: NEGATIVE
Glucose, UA: NEGATIVE mg/dL
Ketones, ur: NEGATIVE mg/dL
Leukocytes, UA: NEGATIVE
Nitrite: NEGATIVE
Protein, ur: NEGATIVE mg/dL
Specific Gravity, Urine: 1.023 (ref 1.005–1.030)
Urobilinogen, UA: 1 mg/dL (ref 0.0–1.0)
pH: 6 (ref 5.0–8.0)

## 2012-10-13 LAB — URINE MICROSCOPIC-ADD ON

## 2012-10-13 LAB — LIPASE, BLOOD: Lipase: 25 U/L (ref 11–59)

## 2012-10-13 LAB — PREGNANCY, URINE: Preg Test, Ur: NEGATIVE

## 2012-10-13 MED ORDER — KETOROLAC TROMETHAMINE 30 MG/ML IJ SOLN
15.0000 mg | Freq: Once | INTRAMUSCULAR | Status: AC
  Administered 2012-10-13: 15 mg via INTRAVENOUS

## 2012-10-13 MED ORDER — NAPROXEN 500 MG PO TABS: 500.0000 mg | ORAL_TABLET | Freq: Two times a day (BID) | ORAL | Status: DC

## 2012-10-13 MED ORDER — HYDROMORPHONE HCL PF 1 MG/ML IJ SOLN
0.5000 mg | Freq: Once | INTRAMUSCULAR | Status: AC
  Administered 2012-10-13: 0.5 mg via INTRAVENOUS
  Filled 2012-10-13: qty 1

## 2012-10-13 MED ORDER — KETOROLAC TROMETHAMINE 30 MG/ML IJ SOLN
30.0000 mg | Freq: Once | INTRAMUSCULAR | Status: AC
  Administered 2012-10-13: 30 mg via INTRAVENOUS
  Filled 2012-10-13: qty 1

## 2012-10-13 MED ORDER — KETOROLAC TROMETHAMINE 30 MG/ML IJ SOLN
INTRAMUSCULAR | Status: AC
  Filled 2012-10-13: qty 1

## 2012-10-13 MED ORDER — ONDANSETRON HCL 4 MG/2ML IJ SOLN
4.0000 mg | Freq: Once | INTRAMUSCULAR | Status: AC
  Administered 2012-10-13: 4 mg via INTRAVENOUS
  Filled 2012-10-13: qty 2

## 2012-10-13 NOTE — ED Notes (Signed)
Pt complains of R side flank and abd pain radiating to thigh. Also complains of nausea, no v/d. Pt crying.

## 2012-10-13 NOTE — ED Provider Notes (Signed)
CSN: [id number]     Arrival date & time 10/13/12  1141 History   First MD Initiated Contact with Patient 10/13/12 1153     Chief Complaint  Patient presents with  . Abdominal Pain   (Consider location/radiation/quality/duration/timing/severity/associated sxs/prior Treatment) HPI Comments: Pt is hysterical, crying, difficult to obtain clear history and perform exam as she prefers to lay prone and in fetal position.  Level 5 caveat.  Pt had mild, intermittent brief episodes of pain, now worse, constant, more severe.  No meds taken PTA.  No prior episode similar to this in the past.  Denies any lower abd or pelvic pain, no GYN symptoms, no dysuria.  Pain does seem to shoot into anterior right thigh however.  No numbness or weakness into leg  Patient is a 32 y.o. female presenting with abdominal pain. The history is provided by the patient.  Abdominal Pain Pain location:  R flank Pain radiates to:  R leg Pain severity:  Severe Onset quality: mild, gradual pain yesterday, sudden worsening and more severe this AM. Duration:  2 days Progression:  Worsening Chronicity:  New Context: awakening from sleep   Context: not eating, not recent illness and not trauma   Relieved by:  Nothing Worsened by:  Nothing tried Ineffective treatments:  None tried Associated symptoms: nausea   Associated symptoms: no chills, no cough, no diarrhea and no fever     Past Medical History  Diagnosis Date  . Asthma   . Anxiety   . Fibroid   . Depression   . Ovarian cyst   . Abnormal Pap smear    Past Surgical History  Procedure Laterality Date  . Dilation and curettage of uterus    . Cesarean section    . Tubal ligation     Family History  Problem Relation Age of Onset  . Anesthesia problems Neg Hx    History  Substance Use Topics  . Smoking status: Current Some Day Smoker -- 1.00 packs/day for 12 years    Types: Cigarettes  . Smokeless tobacco: Not on file  . Alcohol Use: No   OB History   Grav Para Term Preterm Abortions TAB SAB Ect Mult Living   7 3 3  4 1 3         Review of Systems  Unable to perform ROS Constitutional: Negative for fever and chills.  Respiratory: Negative for cough.   Gastrointestinal: Positive for nausea and abdominal pain. Negative for diarrhea.    Allergies  Review of patient's allergies indicates no known allergies.  Home Medications   Current Outpatient Rx  Name  Route  Sig  Dispense  Refill  . acetaminophen (TYLENOL) 500 MG tablet   Oral   Take 500 mg by mouth every 6 (six) hours as needed for pain.          BP 161/103  Pulse 89  Temp(Src) 98.5 F (36.9 C) (Oral)  Resp 22  SpO2 98%  LMP 09/21/2012 Physical Exam  Constitutional: She appears well-developed and well-nourished. She is cooperative.  Non-toxic appearance. She appears distressed.  HENT:  Head: Normocephalic and atraumatic.  Eyes: EOM are normal. No scleral icterus.  Neck: Normal range of motion. Neck supple.  Cardiovascular: Normal rate, regular rhythm and intact distal pulses.   Pulmonary/Chest: Effort normal. No respiratory distress.  Abdominal: Soft. Normal appearance. She exhibits no shifting dullness, no distension, no ascites and no mass. There is tenderness. There is no rebound, no tenderness at McBurney's point and negative Murphy's  sign. Hernia confirmed negative in the ventral area and confirmed negative in the right inguinal area.    Genitourinary: Pelvic exam was performed with patient supine. Uterus is not tender. Cervix exhibits no motion tenderness, no discharge and no friability. Right adnexum displays tenderness. Right adnexum displays no mass and no fullness. Left adnexum displays no tenderness. No tenderness around the vagina.  Chaperone present  Neurological: She is alert.  Skin: Skin is warm and dry. No rash noted. She is not diaphoretic.  Psychiatric: Her mood appears anxious. Her affect is labile. Her speech is not delayed and not tangential. She  is agitated. She is not aggressive, not hyperactive, not slowed, not actively hallucinating and not combative.  crying She is attentive.    ED Course  Procedures (including critical care time) Labs Review Labs Reviewed  URINALYSIS, ROUTINE W REFLEX MICROSCOPIC - Abnormal; Notable for the following:    Hgb urine dipstick SMALL (*)    All other components within normal limits  CBC WITH DIFFERENTIAL - Abnormal; Notable for the following:    WBC 11.0 (*)    Lymphs Abs 4.9 (*)    All other components within normal limits  COMPREHENSIVE METABOLIC PANEL - Abnormal; Notable for the following:    Glucose, Bld 112 (*)    All other components within normal limits  PREGNANCY, URINE  LIPASE, BLOOD  URINE MICROSCOPIC-ADD ON   Imaging Review Ct Abdomen Pelvis Wo Contrast  10/13/2012   CLINICAL DATA:  Right flank pain for 2 days.  EXAM: CT ABDOMEN AND PELVIS WITHOUT CONTRAST  TECHNIQUE: Multidetector CT imaging of the abdomen and pelvis was performed following the standard protocol without intravenous contrast.  COMPARISON:  CT, 01/15/2010.  FINDINGS: Normal kidneys, ureters and bladder.  The heart is normal in size. The lung bases are clear.  Normal liver, spleen, gallbladder and pancreas. No bile duct dilation. No adrenal masses.  The uterus and adnexa are unremarkable.  No adenopathy. There are no abnormal fluid collections.  Normal bowel. Normal appendix.  No bony abnormality.  IMPRESSION: 1. No acute findings. 2. No renal or ureteral stones. No signs of obstruction. 3. Normal exam. No findings to explain right flank pain. A normal appendix is visualized.   Electronically Signed   By: Amie Portland M.D.   On: 10/13/2012 13:44    EKG Interpretation   None      ra sat is 98% and I interpret to be adequate   12:56 PM Pt is feeling improved, no longer writhing around on bed, is able to sit still, has ambulated to the bathroom and back . Will give additional 0.5 mg.   2:09 PM Pt reports  overall feels much improved.  Pt still has some pain but seems to indicate now that it is in RLQ.  CT showed normal appendix.  Thus pelvic exam and bimanual exam shows right adnexal tenderness, mild, will get U/S of pelvis.  Pt does not currently have an OB/GYN physician.    3:03 PM Pt given additiaonl toradol for pain, will get U/S including flow study to assess for torsion. Will sign out to oncoming attending for reassessment and follow up of U/S results.  I suspect pt will be able to be discharged to home with outpt follow up and Rx for pain.    MDM   1. Abdominal pain     Pt's agitation and hysteria seems to be more from her response which I believe to be exaggerated.  She has h/o anxiety,  depression.  No GYN complaints.  Pt has pain in flank, although no CVA tenderness, also midline right side abd pain and tenderness.  I suspect ureteral colic as pt is rocking in the bed and cannot get comfortable.  Will treat symptoms, obtain CT non contrast to assess for stone.      Gavin Pound. Akila Batta, MD 10/13/12 1504

## 2012-10-13 NOTE — ED Notes (Signed)
MD at bedside. 

## 2012-10-13 NOTE — ED Notes (Signed)
US at bedside

## 2012-10-13 NOTE — ED Provider Notes (Signed)
32 year old female, signed out at change of shift, CT scan negative, labs overall unremarkable other than a slight leukocytosis, ultrasound also negative for any acute findings in the adnexa. On my exam she has minimal tenderness in the right lower quadrant and suprapubic area however she does have a positive Carnett's sign.  I have discussed all of the patient's findings with her and her husband, she appears stable for discharge, prescription pain medication given, patient is in agreement to return if she should worsen.    Vida Roller, MD 10/13/12 [phone]

## 2012-10-13 NOTE — ED Notes (Signed)
Patient transported to CT 

## 2012-12-05 ENCOUNTER — Emergency Department (HOSPITAL_COMMUNITY)
Admission: EM | Admit: 2012-12-05 | Discharge: 2012-12-05 | Disposition: A | Discharge status: Home / Self Care | Payer: Medicaid Other | Attending: Emergency Medicine | Admitting: Emergency Medicine

## 2012-12-05 ENCOUNTER — Emergency Department (HOSPITAL_COMMUNITY): Payer: Medicaid Other

## 2012-12-05 ENCOUNTER — Encounter (HOSPITAL_COMMUNITY): Payer: Self-pay | Admitting: Emergency Medicine

## 2012-12-05 DIAGNOSIS — S6992XA Unspecified injury of left wrist, hand and finger(s), initial encounter: Secondary | ICD-10-CM

## 2012-12-05 DIAGNOSIS — Y929 Unspecified place or not applicable: Secondary | ICD-10-CM | POA: Insufficient documentation

## 2012-12-05 DIAGNOSIS — X58XXXA Exposure to other specified factors, initial encounter: Secondary | ICD-10-CM | POA: Insufficient documentation

## 2012-12-05 DIAGNOSIS — Z8742 Personal history of other diseases of the female genital tract: Secondary | ICD-10-CM | POA: Insufficient documentation

## 2012-12-05 DIAGNOSIS — Y9389 Activity, other specified: Secondary | ICD-10-CM | POA: Insufficient documentation

## 2012-12-05 DIAGNOSIS — F17200 Nicotine dependence, unspecified, uncomplicated: Secondary | ICD-10-CM | POA: Insufficient documentation

## 2012-12-05 DIAGNOSIS — Z79899 Other long term (current) drug therapy: Secondary | ICD-10-CM | POA: Insufficient documentation

## 2012-12-05 DIAGNOSIS — J45909 Unspecified asthma, uncomplicated: Secondary | ICD-10-CM | POA: Insufficient documentation

## 2012-12-05 DIAGNOSIS — S6990XA Unspecified injury of unspecified wrist, hand and finger(s), initial encounter: Secondary | ICD-10-CM | POA: Insufficient documentation

## 2012-12-05 DIAGNOSIS — Z8659 Personal history of other mental and behavioral disorders: Secondary | ICD-10-CM | POA: Insufficient documentation

## 2012-12-05 MED ORDER — IBUPROFEN 800 MG PO TABS: 800.0000 mg | ORAL_TABLET | Freq: Three times a day (TID) | ORAL | Status: DC | PRN

## 2012-12-05 MED ORDER — HYDROCODONE-ACETAMINOPHEN 5-325 MG PO TABS: 2.0000 | ORAL_TABLET | ORAL | Status: DC | PRN
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
## 2012-12-05 NOTE — ED Provider Notes (Signed)
Medical screening examination/treatment/procedure(s) were performed by non-physician practitioner and as supervising physician I was immediately available for consultation/collaboration.  EKG Interpretation   None        Donnetta Hutching, MD 12/05/12 2240

## 2013-01-08 ENCOUNTER — Emergency Department (HOSPITAL_COMMUNITY)
Admission: EM | Admit: 2013-01-08 | Discharge: 2013-01-08 | Disposition: A | Discharge status: Home / Self Care | Payer: Medicaid Other | Attending: Emergency Medicine | Admitting: Emergency Medicine

## 2013-01-08 ENCOUNTER — Encounter (HOSPITAL_COMMUNITY): Payer: Self-pay | Admitting: Emergency Medicine

## 2013-01-08 ENCOUNTER — Emergency Department (HOSPITAL_COMMUNITY): Payer: Medicaid Other

## 2013-01-08 DIAGNOSIS — Z9851 Tubal ligation status: Secondary | ICD-10-CM | POA: Insufficient documentation

## 2013-01-08 DIAGNOSIS — M549 Dorsalgia, unspecified: Secondary | ICD-10-CM | POA: Insufficient documentation

## 2013-01-08 DIAGNOSIS — Z9889 Other specified postprocedural states: Secondary | ICD-10-CM | POA: Insufficient documentation

## 2013-01-08 DIAGNOSIS — J45909 Unspecified asthma, uncomplicated: Secondary | ICD-10-CM | POA: Insufficient documentation

## 2013-01-08 DIAGNOSIS — F17200 Nicotine dependence, unspecified, uncomplicated: Secondary | ICD-10-CM | POA: Insufficient documentation

## 2013-01-08 DIAGNOSIS — Z3202 Encounter for pregnancy test, result negative: Secondary | ICD-10-CM | POA: Insufficient documentation

## 2013-01-08 DIAGNOSIS — Z79899 Other long term (current) drug therapy: Secondary | ICD-10-CM | POA: Insufficient documentation

## 2013-01-08 DIAGNOSIS — Z8659 Personal history of other mental and behavioral disorders: Secondary | ICD-10-CM | POA: Insufficient documentation

## 2013-01-08 LAB — URINALYSIS, ROUTINE W REFLEX MICROSCOPIC
BILIRUBIN URINE: NEGATIVE
GLUCOSE, UA: NEGATIVE mg/dL
Ketones, ur: NEGATIVE mg/dL
Leukocytes, UA: NEGATIVE
NITRITE: NEGATIVE
Protein, ur: NEGATIVE mg/dL
SPECIFIC GRAVITY, URINE: 1.017 (ref 1.005–1.030)
Urobilinogen, UA: 0.2 mg/dL (ref 0.0–1.0)
pH: 6 (ref 5.0–8.0)

## 2013-01-08 LAB — WET PREP, GENITAL
TRICH WET PREP: NONE SEEN
Yeast Wet Prep HPF POC: NONE SEEN

## 2013-01-08 LAB — POCT PREGNANCY, URINE: Preg Test, Ur: NEGATIVE

## 2013-01-08 LAB — URINE MICROSCOPIC-ADD ON

## 2013-01-08 MED ORDER — HYDROCODONE-ACETAMINOPHEN 5-325 MG PO TABS: 1.0000 | ORAL_TABLET | Freq: Four times a day (QID) | ORAL | Status: DC | PRN

## 2013-01-08 MED ORDER — HYDROMORPHONE HCL PF 2 MG/ML IJ SOLN
2.0000 mg | Freq: Once | INTRAMUSCULAR | Status: AC
  Administered 2013-01-08: 2 mg via INTRAMUSCULAR
  Filled 2013-01-08: qty 1

## 2013-01-08 MED ORDER — DIPHENHYDRAMINE HCL 25 MG PO CAPS
50.0000 mg | ORAL_CAPSULE | Freq: Once | ORAL | Status: AC
  Administered 2013-01-08: 50 mg via ORAL
  Filled 2013-01-08: qty 2

## 2013-01-08 MED ORDER — HYDROCODONE-ACETAMINOPHEN 5-325 MG PO TABS
2.0000 | ORAL_TABLET | Freq: Once | ORAL | Status: AC
  Administered 2013-01-08: 2 via ORAL
  Filled 2013-01-08: qty 2

## 2013-01-08 NOTE — Discharge Instructions (Signed)
Back Pain, Adult Low back pain is very common. About 1 in 5 people have back pain.The cause of low back pain is rarely dangerous. The pain often gets better over time.About half of people with a sudden onset of back pain feel better in just 2 weeks. About 8 in 10 people feel better by 6 weeks.  CAUSES Some common causes of back pain include:  Strain of the muscles or ligaments supporting the spine.  Wear and tear (degeneration) of the spinal discs.  Arthritis.  Direct injury to the back. DIAGNOSIS Most of the time, the direct cause of low back pain is not known.However, back pain can be treated effectively even when the exact cause of the pain is unknown.Answering your caregiver's questions about your overall health and symptoms is one of the most accurate ways to make sure the cause of your pain is not dangerous. If your caregiver needs more information, he or she may order lab work or imaging tests (X-rays or MRIs).However, even if imaging tests show changes in your back, this usually does not require surgery. HOME CARE INSTRUCTIONS For many people, back pain returns.Since low back pain is rarely dangerous, it is often a condition that people can learn to manageon their own.   Remain active. It is stressful on the back to sit or stand in one place. Do not sit, drive, or stand in one place for more than 30 minutes at a time. Take short walks on level surfaces as soon as pain allows.Try to increase the length of time you walk each day.  Do not stay in bed.Resting more than 1 or 2 days can delay your recovery.  Do not avoid exercise or work.Your body is made to move.It is not dangerous to be active, even though your back may hurt.Your back will likely heal faster if you return to being active before your pain is gone.  Pay attention to your body when you bend and lift. Many people have less discomfortwhen lifting if they bend their knees, keep the load close to their bodies,and  avoid twisting. Often, the most comfortable positions are those that put less stress on your recovering back.  Find a comfortable position to sleep. Use a firm mattress and lie on your side with your knees slightly bent. If you lie on your back, put a pillow under your knees.  Only take over-the-counter or prescription medicines as directed by your caregiver. Over-the-counter medicines to reduce pain and inflammation are often the most helpful.Your caregiver may prescribe muscle relaxant drugs.These medicines help dull your pain so you can more quickly return to your normal activities and healthy exercise.  Put ice on the injured area.  Put ice in a plastic bag.  Place a towel between your skin and the bag.  Leave the ice on for 15-20 minutes, 03-04 times a day for the first 2 to 3 days. After that, ice and heat may be alternated to reduce pain and spasms.  Ask your caregiver about trying back exercises and gentle massage. This may be of some benefit.  Avoid feeling anxious or stressed.Stress increases muscle tension and can worsen back pain.It is important to recognize when you are anxious or stressed and learn ways to manage it.Exercise is a great option. SEEK MEDICAL CARE IF:  You have pain that is not relieved with rest or medicine.  You have pain that does not improve in 1 week.  You have new symptoms.  You are generally not feeling well. SEEK   IMMEDIATE MEDICAL CARE IF:   You have pain that radiates from your back into your legs.  You develop new bowel or bladder control problems.  You have unusual weakness or numbness in your arms or legs.  You develop nausea or vomiting.  You develop abdominal pain.  You feel faint. Document Released: 12/20/2004 Document Revised: 06/21/2011 Document Reviewed: 05/10/2010 [employer] Patient Information 2014 [employer].  

## 2013-01-08 NOTE — ED Notes (Signed)
Pt states in 2012 she was diagnosed with cysts on her ovaries and spine. Pt states back pain got worse yesterday. Denies bleeding and spotting. Pt c/o nausea.

## 2013-01-09 LAB — GC/CHLAMYDIA PROBE AMP
CT Probe RNA: NEGATIVE
GC PROBE AMP APTIMA: NEGATIVE

## 2013-01-09 NOTE — ED Provider Notes (Addendum)
CSN: [id number]     Arrival date & time 01/08/13  1505 History   First MD Initiated Contact with Patient 01/08/13 1910     Chief Complaint  Patient presents with  . Cyst    ovaries and spine  . Vaginal Pain   (Consider location/radiation/quality/duration/timing/severity/associated sxs/prior Treatment) Patient is a 33 y.o. female presenting with vaginal pain. The history is provided by the patient.  Vaginal Pain This is a recurrent problem. The current episode started 3 to 5 hours ago. The problem occurs constantly. The problem has not changed since onset.Pertinent negatives include no chest pain, no abdominal pain, no headaches and no shortness of breath. Nothing aggravates the symptoms. Nothing relieves the symptoms. She has tried nothing for the symptoms.    Past Medical History  Diagnosis Date  . Asthma   . Anxiety   . Fibroid   . Depression   . Ovarian cyst   . Abnormal Pap smear    Past Surgical History  Procedure Laterality Date  . Dilation and curettage of uterus    . Cesarean section    . Tubal ligation     Family History  Problem Relation Age of Onset  . Anesthesia problems Neg Hx    History  Substance Use Topics  . Smoking status: Current Some Day Smoker -- 1.00 packs/day for 12 years    Types: Cigarettes  . Smokeless tobacco: Not on file  . Alcohol Use: No   OB History   Grav Para Term Preterm Abortions TAB SAB Ect Mult Living   7 3 3  4 1 3         Review of Systems  Constitutional: Negative for fever and chills.  Respiratory: Negative for cough and shortness of breath.   Cardiovascular: Negative for chest pain.  Gastrointestinal: Negative for vomiting and abdominal pain.  Genitourinary: Positive for pelvic pain (left sided). Negative for urgency, vaginal bleeding, vaginal discharge and vaginal pain.  Neurological: Negative for headaches.  All other systems reviewed and are negative.    Allergies  Review of patient's allergies indicates no known  allergies.  Home Medications   Current Outpatient Rx  Name  Route  Sig  Dispense  Refill  . Caffeine 100 MG TABS   Oral   Take 1 tablet by mouth daily.         Marland Kitchen ibuprofen (ADVIL,MOTRIN) 800 MG tablet   Oral   Take 1 tablet (800 mg total) by mouth every 8 (eight) hours as needed (pain).   30 tablet   0   . HYDROcodone-acetaminophen (NORCO/VICODIN) 5-325 MG per tablet   Oral   Take 1 tablet by mouth every 6 (six) hours as needed for moderate pain.   20 tablet   0    BP 110/67  Pulse 60  Temp(Src) 98 F (36.7 C) (Oral)  Resp 18  SpO2 96%  LMP 12/22/2012 Physical Exam  Nursing note and vitals reviewed. Constitutional: She is oriented to person, place, and time. She appears well-developed and well-nourished. No distress.  HENT:  Head: Normocephalic and atraumatic.  Eyes: EOM are normal. Pupils are equal, round, and reactive to light.  Neck: Normal range of motion. Neck supple.  Cardiovascular: Normal rate and regular rhythm.  Exam reveals no friction rub.   No murmur heard. Pulmonary/Chest: Effort normal and breath sounds normal. No respiratory distress. She has no wheezes. She has no rales.  Abdominal: Soft. She exhibits no distension. There is no tenderness. There is no rebound.  Genitourinary:  Cervix exhibits no motion tenderness, no discharge and no friability. Right adnexum displays no mass, no tenderness and no fullness. Left adnexum displays tenderness. Left adnexum displays no mass and no fullness.  Musculoskeletal: Normal range of motion. She exhibits no edema.  Neurological: She is alert and oriented to person, place, and time.  Skin: She is not diaphoretic.    ED Course  Procedures (including critical care time) Labs Review Labs Reviewed  WET PREP, GENITAL - Abnormal; Notable for the following:    Clue Cells Wet Prep HPF POC FEW (*)    WBC, Wet Prep HPF POC FEW (*)    All other components within normal limits  URINALYSIS, ROUTINE W REFLEX MICROSCOPIC -  Abnormal; Notable for the following:    Hgb urine dipstick TRACE (*)    All other components within normal limits  URINE MICROSCOPIC-ADD ON - Abnormal; Notable for the following:    Squamous Epithelial / LPF FEW (*)    All other components within normal limits  GC/CHLAMYDIA PROBE AMP  POCT PREGNANCY, URINE   Imaging Review US Transvaginal Non-ob  01/08/2013   CLINICAL DATA:  Pelvic pain.  EXAM: TRANSABDOMINAL AND TRANSVAGINAL ULTRASOUND OF PELVIS  TECHNIQUE: Both transabdominal and transvaginal ultrasound examinations of the pelvis were performed. Transabdominal technique was performed for global imaging of the pelvis including uterus, ovaries, adnexal regions, and pelvic cul-de-sac. It was necessary to proceed with endovaginal exam following the transabdominal exam to visualize the uterus and ovaries.  COMPARISON:  10/13/2012  FINDINGS: Uterus  Measurements: 9.7 x 4.9 x 5.5 cm. No fibroids or other mass visualized.  Endometrium  Thickness: 13 mm. The endometrium is mildly heterogeneous. Trace fluid in the lower endometrial canal.  Right ovary  Measurements: 2.7 x 2.0 x 1.9 cm. Normal follicles present.  Left ovary  Measurements: 4.2 x 2.1 x 2.6 cm. Normal follicles present.  Other findings  A small amount of free fluid is present in the pelvis.  IMPRESSION: Unremarkable pelvic ultrasound.   Electronically Signed   By: Aletta Edouard M.D.   On: 01/08/2013 21:07   US Pelvis Complete  01/08/2013   CLINICAL DATA:  Pelvic pain.  EXAM: TRANSABDOMINAL AND TRANSVAGINAL ULTRASOUND OF PELVIS  TECHNIQUE: Both transabdominal and transvaginal ultrasound examinations of the pelvis were performed. Transabdominal technique was performed for global imaging of the pelvis including uterus, ovaries, adnexal regions, and pelvic cul-de-sac. It was necessary to proceed with endovaginal exam following the transabdominal exam to visualize the uterus and ovaries.  COMPARISON:  10/13/2012  FINDINGS: Uterus  Measurements: 9.7 x  4.9 x 5.5 cm. No fibroids or other mass visualized.  Endometrium  Thickness: 13 mm. The endometrium is mildly heterogeneous. Trace fluid in the lower endometrial canal.  Right ovary  Measurements: 2.7 x 2.0 x 1.9 cm. Normal follicles present.  Left ovary  Measurements: 4.2 x 2.1 x 2.6 cm. Normal follicles present.  Other findings  A small amount of free fluid is present in the pelvis.  IMPRESSION: Unremarkable pelvic ultrasound.   Electronically Signed   By: Aletta Edouard M.D.   On: 01/08/2013 21:07    EKG Interpretation   None       MDM   1. Back pain    56F here with left-sided back pain. Began when she sat on the toilet. States it felt similar to previous ovarian cysts. Describes pain as burning and that her genitals are on fire.  Here, vitals stable, she is uncomfortable. L adnexal tenderness on exam. Patient  has no abdominal pain. Will obtain transvaginal US. Transvaginal US negative. Patient given benadryl for itching, vicodin. Resting comfortably, texting on her phone. Stable for discharge.  When reviewing her imaging, I realized we did not obtain doppler studies on her ovaries. I called the patient and spoke to her. She's still having burning pain in her back. I informed her we didn't get doppler studies and recommended she return. I explained the risk of decreased blood flow to her ovaries. She states she would like to wait for the morning. I asked her to come back, but she stated she would rather wait. Patient understands and advised to continue with pain meds and that we are available if things worsen.    Osvaldo Shipper, MD 01/09/13 4580  Osvaldo Shipper, MD 01/09/13 9983  Osvaldo Shipper, MD 01/09/13 [phone]

## 2013-01-13 ENCOUNTER — Encounter (HOSPITAL_COMMUNITY): Payer: Self-pay | Admitting: Emergency Medicine

## 2013-01-13 ENCOUNTER — Emergency Department (HOSPITAL_COMMUNITY): Payer: Medicaid Other

## 2013-01-13 ENCOUNTER — Emergency Department (HOSPITAL_COMMUNITY)
Admission: EM | Admit: 2013-01-13 | Discharge: 2013-01-13 | Disposition: A | Discharge status: Home / Self Care | Payer: Medicaid Other | Attending: Emergency Medicine | Admitting: Emergency Medicine

## 2013-01-13 DIAGNOSIS — K4090 Unilateral inguinal hernia, without obstruction or gangrene, not specified as recurrent: Secondary | ICD-10-CM

## 2013-01-13 DIAGNOSIS — F17200 Nicotine dependence, unspecified, uncomplicated: Secondary | ICD-10-CM | POA: Insufficient documentation

## 2013-01-13 DIAGNOSIS — R1032 Left lower quadrant pain: Secondary | ICD-10-CM | POA: Insufficient documentation

## 2013-01-13 DIAGNOSIS — E669 Obesity, unspecified: Secondary | ICD-10-CM | POA: Insufficient documentation

## 2013-01-13 DIAGNOSIS — Z8742 Personal history of other diseases of the female genital tract: Secondary | ICD-10-CM | POA: Insufficient documentation

## 2013-01-13 DIAGNOSIS — J45909 Unspecified asthma, uncomplicated: Secondary | ICD-10-CM | POA: Insufficient documentation

## 2013-01-13 DIAGNOSIS — F411 Generalized anxiety disorder: Secondary | ICD-10-CM | POA: Insufficient documentation

## 2013-01-13 DIAGNOSIS — R110 Nausea: Secondary | ICD-10-CM | POA: Insufficient documentation

## 2013-01-13 LAB — URINALYSIS, ROUTINE W REFLEX MICROSCOPIC
Bilirubin Urine: NEGATIVE
Glucose, UA: NEGATIVE mg/dL
HGB URINE DIPSTICK: NEGATIVE
Ketones, ur: NEGATIVE mg/dL
Leukocytes, UA: NEGATIVE
Nitrite: NEGATIVE
Protein, ur: NEGATIVE mg/dL
Specific Gravity, Urine: 1.018 (ref 1.005–1.030)
UROBILINOGEN UA: 1 mg/dL (ref 0.0–1.0)
pH: 7 (ref 5.0–8.0)

## 2013-01-13 LAB — CBC WITH DIFFERENTIAL/PLATELET
Basophils Absolute: 0 10*3/uL (ref 0.0–0.1)
Basophils Relative: 0 % (ref 0–1)
Eosinophils Absolute: 0.2 10*3/uL (ref 0.0–0.7)
Eosinophils Relative: 2 % (ref 0–5)
HEMATOCRIT: 39.1 % (ref 36.0–46.0)
Hemoglobin: 13.1 g/dL (ref 12.0–15.0)
LYMPHS PCT: 44 % (ref 12–46)
Lymphs Abs: 4.7 10*3/uL — ABNORMAL HIGH (ref 0.7–4.0)
MCH: 29.6 pg (ref 26.0–34.0)
MCHC: 33.5 g/dL (ref 30.0–36.0)
MCV: 88.3 fL (ref 78.0–100.0)
MONO ABS: 0.7 10*3/uL (ref 0.1–1.0)
MONOS PCT: 6 % (ref 3–12)
NEUTROS ABS: 5.1 10*3/uL (ref 1.7–7.7)
Neutrophils Relative %: 48 % (ref 43–77)
Platelets: 292 10*3/uL (ref 150–400)
RBC: 4.43 MIL/uL (ref 3.87–5.11)
RDW: 14.2 % (ref 11.5–15.5)
WBC: 10.6 10*3/uL — AB (ref 4.0–10.5)

## 2013-01-13 LAB — COMPREHENSIVE METABOLIC PANEL
ALT: 13 U/L (ref 0–35)
AST: 14 U/L (ref 0–37)
Albumin: 3.8 g/dL (ref 3.5–5.2)
Alkaline Phosphatase: 72 U/L (ref 39–117)
BUN: 7 mg/dL (ref 6–23)
CHLORIDE: 100 meq/L (ref 96–112)
CO2: 21 meq/L (ref 19–32)
CREATININE: 0.77 mg/dL (ref 0.50–1.10)
Calcium: 8.9 mg/dL (ref 8.4–10.5)
GFR calc Af Amer: >90 mL/min (ref >90)
Glucose, Bld: 103 mg/dL — ABNORMAL HIGH (ref 70–99)
Potassium: 4.3 mEq/L (ref 3.7–5.3)
Sodium: 134 mEq/L — ABNORMAL LOW (ref 137–147)
Total Protein: 7.2 g/dL (ref 6.0–8.3)

## 2013-01-13 LAB — LACTIC ACID, PLASMA: LACTIC ACID, VENOUS: 1 mmol/L (ref 0.5–2.2)

## 2013-01-13 MED ORDER — OXYCODONE-ACETAMINOPHEN 5-325 MG PO TABS: 1.0000 | ORAL_TABLET | Freq: Four times a day (QID) | ORAL | Status: DC | PRN

## 2013-01-13 MED ORDER — ONDANSETRON HCL 4 MG/2ML IJ SOLN
4.0000 mg | Freq: Once | INTRAMUSCULAR | Status: AC
  Administered 2013-01-13: 4 mg via INTRAVENOUS
  Filled 2013-01-13: qty 2

## 2013-01-13 MED ORDER — MORPHINE SULFATE 4 MG/ML IJ SOLN
4.0000 mg | Freq: Once | INTRAMUSCULAR | Status: AC
  Administered 2013-01-13: 4 mg via INTRAVENOUS
  Filled 2013-01-13: qty 1

## 2013-01-13 MED ORDER — IOHEXOL 300 MG/ML  SOLN
50.0000 mL | Freq: Once | INTRAMUSCULAR | Status: AC | PRN
  Administered 2013-01-13: 50 mL via ORAL

## 2013-01-13 MED ORDER — SODIUM CHLORIDE 0.9 % IV BOLUS (SEPSIS)
1000.0000 mL | Freq: Once | INTRAVENOUS | Status: AC
  Administered 2013-01-13: 1000 mL via INTRAVENOUS

## 2013-01-13 MED ORDER — IOHEXOL 300 MG/ML  SOLN
100.0000 mL | Freq: Once | INTRAMUSCULAR | Status: AC | PRN
  Administered 2013-01-13: 100 mL via INTRAVENOUS

## 2013-01-13 NOTE — ED Notes (Signed)
Pt states that she was here previously for LLQ pain.  Was called at home telling her to come back to the ER because she was "not getting blood flow to her ovary".  Pt states that she did not come back because she "was too drugged up on vicodin to come back".  C/o worsening pain.

## 2013-01-13 NOTE — ED Notes (Signed)
Bedside report received from previous RN, Martha. 

## 2013-01-13 NOTE — ED Notes (Signed)
Pain increasing post pelvic.  Per pt, notified MD upon assessment.

## 2013-01-13 NOTE — ED Provider Notes (Signed)
CSN: [id number]     Arrival date & time 01/13/13  1451 History   First MD Initiated Contact with Patient 01/13/13 1550     Chief Complaint  Patient presents with  . Abdominal Pain   (Consider location/radiation/quality/duration/timing/severity/associated sxs/prior Treatment) Patient is a 33 y.o. female presenting with abdominal pain.  Abdominal Pain Pain location:  LLQ Pain quality: cramping and shooting   Pain radiation: groin. Pain severity:  Moderate Onset quality:  Gradual Duration:  5 days Timing:  Constant Progression since onset: initially improved, recurred. Chronicity:  New Context comment:  Seen in ED 5 days ago and had an unremarkable pelvic ultrasound Relieved by: vicodin. Exacerbated by: movement. Associated symptoms: nausea   Associated symptoms: no chest pain, no cough, no diarrhea, no fever, no shortness of breath, no vaginal bleeding, no vaginal discharge and no vomiting   Associated symptoms comment:  Vaginal pain   Past Medical History  Diagnosis Date  . Asthma   . Anxiety   . Fibroid   . Depression   . Ovarian cyst   . Abnormal Pap smear    Past Surgical History  Procedure Laterality Date  . Dilation and curettage of uterus    . Cesarean section    . Tubal ligation     Family History  Problem Relation Age of Onset  . Anesthesia problems Neg Hx    History  Substance Use Topics  . Smoking status: Current Some Day Smoker -- 1.00 packs/day for 12 years    Types: Cigarettes  . Smokeless tobacco: Not on file  . Alcohol Use: No   OB History   Grav Para Term Preterm Abortions TAB SAB Ect Mult Living   7 3 3  4 1 3         Review of Systems  Constitutional: Negative for fever.  HENT: Negative for congestion.   Respiratory: Negative for cough and shortness of breath.   Cardiovascular: Negative for chest pain.  Gastrointestinal: Positive for nausea and abdominal pain. Negative for vomiting and diarrhea.  Genitourinary: Negative for vaginal  bleeding and vaginal discharge.  All other systems reviewed and are negative.    Allergies  Review of patient's allergies indicates no known allergies.  Home Medications   Current Outpatient Rx  Name  Route  Sig  Dispense  Refill  . Caffeine 100 MG TABS   Oral   Take 1 tablet by mouth daily.         Marland Kitchen HYDROcodone-acetaminophen (NORCO/VICODIN) 5-325 MG per tablet   Oral   Take 1 tablet by mouth every 6 (six) hours as needed for moderate pain.   20 tablet   0   . ibuprofen (ADVIL,MOTRIN) 800 MG tablet   Oral   Take 1 tablet (800 mg total) by mouth every 8 (eight) hours as needed (pain).   30 tablet   0    BP 109/63  Pulse 78  Temp(Src) 98.5 F (36.9 C) (Oral)  Resp 17  SpO2 99%  LMP 12/22/2012 Physical Exam  Nursing note and vitals reviewed. Constitutional: She is oriented to person, place, and time. She appears well-developed and well-nourished. No distress.  obese  HENT:  Head: Normocephalic and atraumatic.  Mouth/Throat: Oropharynx is clear and moist.  Eyes: Conjunctivae are normal. Pupils are equal, round, and reactive to light. No scleral icterus.  Neck: Neck supple.  Cardiovascular: Normal rate, regular rhythm, normal heart sounds and intact distal pulses.   No murmur heard. Pulmonary/Chest: Effort normal and breath sounds normal. No  stridor. No respiratory distress. She has no rales.  Abdominal: Soft. Bowel sounds are normal. She exhibits no distension. There is tenderness in the left lower quadrant. There is no rigidity, no rebound, no guarding and no CVA tenderness. Hernia confirmed negative in the right inguinal area and confirmed negative in the left inguinal area.  Genitourinary: There is no rash or tenderness on the right labia. There is no rash or tenderness on the left labia. Uterus is not enlarged and not tender. Cervix exhibits no motion tenderness, no discharge and no friability. Right adnexum displays no mass, no tenderness and no fullness. Left  adnexum displays tenderness. Left adnexum displays no mass and no fullness. No vaginal discharge found.  Exam limited somewhat by patient discomfort.  Tenderness seems to be most localized to her left inguinal ligament.   Musculoskeletal: Normal range of motion. She exhibits no edema.  Lymphadenopathy:       Right: No inguinal adenopathy present.       Left: No inguinal adenopathy present.  Neurological: She is alert and oriented to person, place, and time.  Skin: Skin is warm and dry. No rash noted.  Psychiatric: She has a normal mood and affect. Her behavior is normal.    ED Course  Procedures (including critical care time) Labs Review Labs Reviewed  CBC WITH DIFFERENTIAL - Abnormal; Notable for the following:    WBC 10.6 (*)    Lymphs Abs 4.7 (*)    All other components within normal limits  COMPREHENSIVE METABOLIC PANEL - Abnormal; Notable for the following:    Sodium 134 (*)    Glucose, Bld 103 (*)    Total Bilirubin <0.2 (*)    All other components within normal limits  URINALYSIS, ROUTINE W REFLEX MICROSCOPIC  LACTIC ACID, PLASMA   Imaging Review Ct Abdomen Pelvis W Contrast  01/13/2013   CLINICAL DATA:  Left lower quadrant pain  EXAM: CT ABDOMEN AND PELVIS WITH CONTRAST  TECHNIQUE: Multidetector CT imaging of the abdomen and pelvis was performed using the standard protocol following bolus administration of intravenous contrast.  CONTRAST:  159mL OMNIPAQUE IOHEXOL 300 MG/ML  SOLN  COMPARISON:  Pelvic ultrasound dated 01/08/2013. CT abdomen pelvis dated 10/13/2012.  FINDINGS: Lung bases are clear.  Liver, spleen, pancreas, and adrenal glands are within normal limits.  Gallbladder is unremarkable. No intrahepatic or extrahepatic ductal dilatation.  Kidneys are within normal limits.  No hydronephrosis.  No evidence of bowel obstruction.  Normal appendix.  No evidence of abdominal aortic aneurysm.  Trace pelvic ascites, likely physiologic.  No suspicious abdominopelvic  lymphadenopathy.  Uterus and bilateral ovaries are grossly unremarkable by CT.  Bladder is within normal limits.  Tiny fat containing bilateral inguinal hernias, left greater than right.  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 Visualized osseous structures are otherwise within normal limits.  IMPRESSION: No evidence of bowel obstruction.  Normal appendix.  No CT findings to account for the patient's left lower quadrant pain.   Electronically Signed   By: Julian Hy M.D.   On: 01/13/2013 20:00  All radiology studies independently viewed by me.     EKG Interpretation   None       MDM   1. Abdominal pain, LLQ   2. Inguinal hernia    33 yo female with left lower quadrant abdominal/pelvic pain for about 5 days.   Has had similar symptoms  in past.  Seen in ED at which time Pelvic US showed no concerning findings.  Did not have doppler imaging, but no other ovarian signs of torsion.  Meanwhile, her symptoms have continued.  I repeated her pelvic exam, which showed left sided tenderness.  However, this tenderness seemed to be most localized to her inguinal ligament area.  (Her repeat GC/Chlamydia and wet prep were unusable, but these repeat labs were unlikely to be different than before.  Therefore didn't repeat exam for repeat swabs).  CT was obtained because of significance of pain.  It showed no intraabdominal pathology.  It did show tiny fat containing inguinal hernias.  I did not appreciate this on exam.  However, because this area seemed to be source of pain, will refer to general surgery.  Meanwhile, her pain was well controlled and she was able to ambulate easily out of the ED.    Houston Siren, MD 01/13/13 2352

## 2013-01-13 NOTE — ED Notes (Signed)
Patient transported to CT on stretcher

## 2013-01-13 NOTE — Discharge Instructions (Signed)
Abdominal Pain, Women °Abdominal (stomach, pelvic, or belly) pain can be caused by many things. It is important to tell your doctor: °· The location of the pain. °· Does it come and go or is it present all the time? °· Are there things that start the pain (eating certain foods, exercise)? °· Are there other symptoms associated with the pain (fever, nausea, vomiting, diarrhea)? °All of this is helpful to know when trying to find the cause of the pain. °CAUSES  °· Stomach: virus or bacteria infection, or ulcer. °· Intestine: appendicitis (inflamed appendix), regional ileitis (Crohn's disease), ulcerative colitis (inflamed colon), irritable bowel syndrome, diverticulitis (inflamed diverticulum of the colon), or cancer of the stomach or intestine. °· Gallbladder disease or stones in the gallbladder. °· Kidney disease, kidney stones, or infection. °· Pancreas infection or cancer. °· Fibromyalgia (pain disorder). °· Diseases of the female organs: °· Uterus: fibroid (non-cancerous) tumors or infection. °· Fallopian tubes: infection or tubal pregnancy. °· Ovary: cysts or tumors. °· Pelvic adhesions (scar tissue). °· Endometriosis (uterus lining tissue growing in the pelvis and on the pelvic organs). °· Pelvic congestion syndrome (female organs filling up with blood just before the menstrual period). °· Pain with the menstrual period. °· Pain with ovulation (producing an egg). °· Pain with an IUD (intrauterine device, birth control) in the uterus. °· Cancer of the female organs. °· Functional pain (pain not caused by a disease, may improve without treatment). °· Psychological pain. °· Depression. °DIAGNOSIS  °Your doctor will decide the seriousness of your pain by doing an examination. °· Blood tests. °· X-rays. °· Ultrasound. °· CT scan (computed tomography, special type of X-ray). °· MRI (magnetic resonance imaging). °· Cultures, for infection. °· Barium enema (dye inserted in the large intestine, to better view it with  X-rays). °· Colonoscopy (looking in intestine with a lighted tube). °· Laparoscopy (minor surgery, looking in abdomen with a lighted tube). °· Major abdominal exploratory surgery (looking in abdomen with a large incision). °TREATMENT  °The treatment will depend on the cause of the pain.  °· Many cases can be observed and treated at home. °· Over-the-counter medicines recommended by your caregiver. °· Prescription medicine. °· Antibiotics, for infection. °· Birth control pills, for painful periods or for ovulation pain. °· Hormone treatment, for endometriosis. °· Nerve blocking injections. °· Physical therapy. °· Antidepressants. °· Counseling with a psychologist or psychiatrist. °· Minor or major surgery. °HOME CARE INSTRUCTIONS  °· Do not take laxatives, unless directed by your caregiver. °· Take over-the-counter pain medicine only if ordered by your caregiver. Do not take aspirin because it can cause an upset stomach or bleeding. °· Try a clear liquid diet (broth or water) as ordered by your caregiver. Slowly move to a bland diet, as tolerated, if the pain is related to the stomach or intestine. °· Have a thermometer and take your temperature several times a day, and record it. °· Bed rest and sleep, if it helps the pain. °· Avoid sexual intercourse, if it causes pain. °· Avoid stressful situations. °· Keep your follow-up appointments and tests, as your caregiver orders. °· If the pain does not go away with medicine or surgery, you may try: °· Acupuncture. °· Relaxation exercises (yoga, meditation). °· Group therapy. °· Counseling. °SEEK MEDICAL CARE IF:  °· You notice certain foods cause stomach pain. °· Your home care treatment is not helping your pain. °· You need stronger pain medicine. °· You want your IUD removed. °· You feel faint or   lightheaded.  You develop nausea and vomiting.  You develop a rash.  You are having side effects or an allergy to your medicine. SEEK IMMEDIATE MEDICAL CARE IF:   Your  pain does not go away or gets worse.  You have a fever.  Your pain is felt only in portions of the abdomen. The right side could possibly be appendicitis. The left lower portion of the abdomen could be colitis or diverticulitis.  You are passing blood in your stools (bright red or black tarry stools, with or without vomiting).  You have blood in your urine.  You develop chills, with or without a fever.  You pass out. MAKE SURE YOU:   Understand these instructions.  Will watch your condition.  Will get help right away if you are not doing well or get worse. Document Released: 10/17/2006 Document Revised: 03/14/2011 Document Reviewed: 11/06/2008 [facility] Patient Information 2014 [place], [place].  Inguinal Hernia, Adult Muscles help keep everything in the body in its proper place. But if a weak spot in the muscles develops, something can poke through. That is called a hernia. When this happens in the lower part of the belly (abdomen), it is called an inguinal hernia. (It takes its name from a part of the body in this region called the inguinal canal.) A weak spot in the wall of muscles lets some fat or part of the small intestine bulge through. An inguinal hernia can develop at any age. Men get them more often than women. CAUSES  In adults, an inguinal hernia develops over time.  It can be triggered by:  Suddenly straining the muscles of the lower abdomen.  Lifting heavy objects.  Straining to have a bowel movement. Difficult bowel movements (constipation) can lead to this.  Constant coughing. This may be caused by smoking or lung disease.  Being overweight.  Being pregnant.  Working at a job that requires long periods of standing or heavy lifting.  Having had an inguinal hernia before. One type can be an emergency situation. It is called a strangulated inguinal hernia. It develops if part of the small intestine slips through the weak spot and cannot get back into the  abdomen. The blood supply can be cut off. If that happens, part of the intestine may die. This situation requires emergency surgery. SYMPTOMS  Often, a small inguinal hernia has no symptoms. It is found when a healthcare provider does a physical exam. Larger hernias usually have symptoms.   In adults, symptoms may include:  A lump in the groin. This is easier to see when the person is standing. It might disappear when lying down.  In men, a lump in the scrotum.  Pain or burning in the groin. This occurs especially when lifting, straining or coughing.  A dull ache or feeling of pressure in the groin.  Signs of a strangulated hernia can include:  A bulge in the groin that becomes very painful and tender to the touch.  A bulge that turns red or purple.  Fever, nausea and vomiting.  Inability to have a bowel movement or to pass gas. DIAGNOSIS  To decide if you have an inguinal hernia, a healthcare provider will probably do a physical examination.  This will include asking questions about any symptoms you have noticed.  The healthcare provider might feel the groin area and ask you to cough. If an inguinal hernia is felt, the healthcare provider may try to slide it back into the abdomen.  Usually no other  tests are needed. TREATMENT  Treatments can vary. The size of the hernia makes a difference. Options include:  Watchful waiting. This is often suggested if the hernia is small and you have had no symptoms.  No medical procedure will be done unless symptoms develop.  You will need to watch closely for symptoms. If any occur, contact your healthcare provider right away.  Surgery. This is used if the hernia is larger or you have symptoms.  Open surgery. This is usually an outpatient procedure (you will not stay overnight in a hospital). An cut (incision) is made through the skin in the groin. The hernia is put back inside the abdomen. The weak area in the muscles is then repaired by  herniorrhaphy or hernioplasty. Herniorrhaphy: in this type of surgery, the weak muscles are sewn back together. Hernioplasty: a patch or mesh is used to close the weak area in the abdominal wall.  Laparoscopy. In this procedure, a surgeon makes small incisions. A thin tube with a tiny video camera (called a laparoscope) is put into the abdomen. The surgeon repairs the hernia with mesh by looking with the video camera and using two long instruments. HOME CARE INSTRUCTIONS   After surgery to repair an inguinal hernia:  You will need to take pain medicine prescribed by your healthcare provider. Follow all directions carefully.  You will need to take care of the wound from the incision.  Your activity will be restricted for awhile. This will probably include no heavy lifting for several weeks. You also should not do anything too active for a few weeks. When you can return to work will depend on the type of job that you have.  During "watchful waiting" periods, you should:  Maintain a healthy weight.  Eat a diet high in fiber (fruits, vegetables and whole grains).  Drink plenty of fluids to avoid constipation. This means drinking enough water and other liquids to keep your urine clear or pale yellow.  Do not lift heavy objects.  Do not stand for long periods of time.  Quit smoking. This should keep you from developing a frequent cough. SEEK MEDICAL CARE IF:   A bulge develops in your groin area.  You feel pain, a burning sensation or pressure in the groin. This might be worse if you are lifting or straining.  You develop a fever of more than 100.5 F (38.1 C). SEEK IMMEDIATE MEDICAL CARE IF:   Pain in the groin increases suddenly.  A bulge in the groin gets bigger suddenly and does not go down.  For men, there is sudden pain in the scrotum. Or, the size of the scrotum increases.  A bulge in the groin area becomes red or purple and is painful to touch.  You have nausea or  vomiting that does not go away.  You feel your heart beating much faster than normal.  You cannot have a bowel movement or pass gas.  You develop a fever of more than 102.0 F (38.9 C). Document Released: 05/08/2008 Document Revised: 03/14/2011 Document Reviewed: 05/08/2008 [facility] Patient Information 2014 [place], [place].

## 2013-01-18 ENCOUNTER — Encounter (INDEPENDENT_AMBULATORY_CARE_PROVIDER_SITE_OTHER): Payer: Self-pay | Admitting: Surgery

## 2013-01-21 ENCOUNTER — Encounter (INDEPENDENT_AMBULATORY_CARE_PROVIDER_SITE_OTHER): Payer: Self-pay | Admitting: Surgery

## 2013-01-23 ENCOUNTER — Encounter (INDEPENDENT_AMBULATORY_CARE_PROVIDER_SITE_OTHER): Payer: Self-pay | Admitting: Surgery

## 2013-01-28 ENCOUNTER — Encounter (INDEPENDENT_AMBULATORY_CARE_PROVIDER_SITE_OTHER): Payer: Self-pay | Admitting: Surgery

## 2013-01-28 ENCOUNTER — Ambulatory Visit (INDEPENDENT_AMBULATORY_CARE_PROVIDER_SITE_OTHER): Payer: Medicaid Other | Admitting: Surgery

## 2013-01-28 VITALS — BP 118/70 | HR 84 | Temp 97.5°F | Resp 18 | Ht 67.0 in | Wt 246.5 lb

## 2013-01-28 DIAGNOSIS — K4090 Unilateral inguinal hernia, without obstruction or gangrene, not specified as recurrent: Secondary | ICD-10-CM

## 2013-01-28 MED ORDER — HYDROCODONE-ACETAMINOPHEN 5-325 MG PO TABS: 1.0000 | ORAL_TABLET | ORAL | Status: DC | PRN

## 2013-01-28 NOTE — Progress Notes (Signed)
Patient ID: Victoria Clayton, female   DOB: [birth year], 33 y.o.   MRN: [id number]  Chief Complaint  Patient presents with  . Inguinal Hernia    HPI Victoria Clayton is a 33 y.o. female.  Referred by Dr. Jeanie Cooks for evaluation of inguinal hernia HPI Is a 33 year old female who is morbidly obese who presents with several months of intermittent sharp left lower quadrant left groin pain.  This has become more severe. She presented to the emergency department for evaluation. She also has some lower back pain. Her CT scan showed tiny bilateral inguinal hernias. She also has some apparent radiculopathy of her back that causes pain that radiates down the back of her left leg. The pain in her groin has become more severe. She has no right-sided symptoms. Past Medical History  Diagnosis Date  . Asthma   . Anxiety   . Fibroid   . Depression   . Ovarian cyst   . Abnormal Pap smear   . Tumor cells, uncertain whether benign or malignant     Pt states tumors near spine    Past Surgical History  Procedure Laterality Date  . Dilation and curettage of uterus    . Cesarean section    . Tubal ligation      Family History  Problem Relation Age of Onset  . Anesthesia problems Neg Hx     Social History History  Substance Use Topics  . Smoking status: Current Some Day Smoker -- 1.00 packs/day for 12 years    Types: Cigarettes  . Smokeless tobacco: Not on file  . Alcohol Use: Yes    Allergies  Allergen Reactions  . Percocet [Oxycodone-Acetaminophen]     Hives and itching    Current Outpatient Prescriptions  Medication Sig Dispense Refill  . HYDROcodone-acetaminophen (NORCO/VICODIN) 5-325 MG per tablet Take 1 tablet by mouth every 4 (four) hours as needed.  40 tablet  0   No current facility-administered medications for this visit.    Review of Systems Review of Systems  Constitutional: Negative for fever, chills and unexpected weight change.  HENT: Negative for  congestion, hearing loss, sore throat, trouble swallowing and voice change.   Eyes: Negative for visual disturbance.  Respiratory: Negative for cough and wheezing.   Cardiovascular: Negative for chest pain, palpitations and leg swelling.  Gastrointestinal: Positive for abdominal pain. Negative for nausea, vomiting, diarrhea, constipation, blood in stool, abdominal distention and anal bleeding.  Genitourinary: Negative for hematuria, vaginal bleeding and difficulty urinating.  Musculoskeletal: Positive for arthralgias, back pain and myalgias.  Skin: Negative for rash and wound.  Neurological: Negative for seizures, syncope and headaches.  Hematological: Negative for adenopathy. Does not bruise/bleed easily.  Psychiatric/Behavioral: Negative for confusion.    Blood pressure 118/70, pulse 84, temperature 97.5 F (36.4 C), temperature source Oral, resp. rate 18, height 5\' 7"  (1.702 m), weight 246 lb 8 oz (111.812 kg), last menstrual period 12/22/2012.  Physical Exam Physical Exam WDWN in NAD HEENT:  EOMI, sclera anicteric Neck:  No masses, no thyromegaly Lungs:  CTA bilaterally; normal respiratory effort CV:  Regular rate and rhythm; no murmurs Abd:  +bowel sounds, soft, non-tender, no masses GU;  No palpable hernia on right; significant tenderness at external inguinal ring on the left; small palpable hernia on Valsalva maneuver on the left Ext:  Well-perfused; no edema Skin:  Warm, dry; no sign of jaundice  Data Reviewed US Transvaginal Non-ob  01/08/2013   CLINICAL DATA:  Pelvic pain.  EXAM: TRANSABDOMINAL AND  TRANSVAGINAL ULTRASOUND OF PELVIS  TECHNIQUE: Both transabdominal and transvaginal ultrasound examinations of the pelvis were performed. Transabdominal technique was performed for global imaging of the pelvis including uterus, ovaries, adnexal regions, and pelvic cul-de-sac. It was necessary to proceed with endovaginal exam following the transabdominal exam to visualize the uterus  and ovaries.  COMPARISON:  10/13/2012  FINDINGS: Uterus  Measurements: 9.7 x 4.9 x 5.5 cm. No fibroids or other mass visualized.  Endometrium  Thickness: 13 mm. The endometrium is mildly heterogeneous. Trace fluid in the lower endometrial canal.  Right ovary  Measurements: 2.7 x 2.0 x 1.9 cm. Normal follicles present.  Left ovary  Measurements: 4.2 x 2.1 x 2.6 cm. Normal follicles present.  Other findings  A small amount of free fluid is present in the pelvis.  IMPRESSION: Unremarkable pelvic ultrasound.   Electronically Signed   By: Aletta Edouard M.D.   On: 01/08/2013 21:07   US Pelvis Complete  01/08/2013   CLINICAL DATA:  Pelvic pain.  EXAM: TRANSABDOMINAL AND TRANSVAGINAL ULTRASOUND OF PELVIS  TECHNIQUE: Both transabdominal and transvaginal ultrasound examinations of the pelvis were performed. Transabdominal technique was performed for global imaging of the pelvis including uterus, ovaries, adnexal regions, and pelvic cul-de-sac. It was necessary to proceed with endovaginal exam following the transabdominal exam to visualize the uterus and ovaries.  COMPARISON:  10/13/2012  FINDINGS: Uterus  Measurements: 9.7 x 4.9 x 5.5 cm. No fibroids or other mass visualized.  Endometrium  Thickness: 13 mm. The endometrium is mildly heterogeneous. Trace fluid in the lower endometrial canal.  Right ovary  Measurements: 2.7 x 2.0 x 1.9 cm. Normal follicles present.  Left ovary  Measurements: 4.2 x 2.1 x 2.6 cm. Normal follicles present.  Other findings  A small amount of free fluid is present in the pelvis.  IMPRESSION: Unremarkable pelvic ultrasound.   Electronically Signed   By: Aletta Edouard M.D.   On: 01/08/2013 21:07   Ct Abdomen Pelvis W Contrast  01/13/2013   CLINICAL DATA:  Left lower quadrant pain  EXAM: CT ABDOMEN AND PELVIS WITH CONTRAST  TECHNIQUE: Multidetector CT imaging of the abdomen and pelvis was performed using the standard protocol following bolus administration of intravenous contrast.  CONTRAST:   161mL OMNIPAQUE IOHEXOL 300 MG/ML  SOLN  COMPARISON:  Pelvic ultrasound dated 01/08/2013. CT abdomen pelvis dated 10/13/2012.  FINDINGS: Lung bases are clear.  Liver, spleen, pancreas, and adrenal glands are within normal limits.  Gallbladder is unremarkable. No intrahepatic or extrahepatic ductal dilatation.  Kidneys are within normal limits.  No hydronephrosis.  No evidence of bowel obstruction.  Normal appendix.  No evidence of abdominal aortic aneurysm.  Trace pelvic ascites, likely physiologic.  No suspicious abdominopelvic lymphadenopathy.  Uterus and bilateral ovaries are grossly unremarkable by CT.  Bladder is within normal limits.  Tiny fat containing bilateral inguinal hernias, left greater than right.  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 Visualized osseous structures are otherwise within normal limits.  IMPRESSION: No evidence of bowel obstruction.  Normal appendix.  No CT findings to account for the patient's left lower quadrant pain.   Electronically Signed   By: Julian Hy M.D.   On: 01/13/2013 20:00      Assessment    Left inguinal hernia - tender; reducible Tiny asymptomatic right inguinal hernia Chronic back pain     Plan    Left inguinal hernia  repair with mesh.  The surgical procedure has been discussed with the patient.  Potential risks, benefits, alternative treatments, and expected outcomes have been explained.  All of the patient's questions at this time have been answered.  The likelihood of reaching the patient's treatment goal is good.  The patient understand the proposed surgical procedure and wishes to proceed.  Since she is asymptomatic, we will watch the right side for now.  She should also wait before starting therapy on her lower back.  Her groin symptoms are likely from her inguinal hernia.        Mckinzee Spirito  K. 01/28/2013, 12:00 PM

## 2013-02-05 ENCOUNTER — Other Ambulatory Visit (INDEPENDENT_AMBULATORY_CARE_PROVIDER_SITE_OTHER): Payer: Self-pay | Admitting: *Deleted

## 2013-02-05 DIAGNOSIS — K4090 Unilateral inguinal hernia, without obstruction or gangrene, not specified as recurrent: Secondary | ICD-10-CM

## 2013-02-05 MED ORDER — HYDROCODONE-ACETAMINOPHEN 5-325 MG PO TABS: 1.0000 | ORAL_TABLET | ORAL | Status: DC | PRN

## 2013-04-12 ENCOUNTER — Encounter: Payer: Self-pay | Admitting: Advanced Practice Midwife

## 2013-04-28 ENCOUNTER — Encounter (HOSPITAL_COMMUNITY): Payer: Self-pay | Admitting: Emergency Medicine

## 2013-04-28 ENCOUNTER — Emergency Department (HOSPITAL_COMMUNITY): Payer: Medicaid Other

## 2013-04-28 ENCOUNTER — Emergency Department (HOSPITAL_COMMUNITY)
Admission: EM | Admit: 2013-04-28 | Discharge: 2013-04-28 | Disposition: A | Discharge status: Home / Self Care | Payer: Medicaid Other | Attending: Emergency Medicine | Admitting: Emergency Medicine

## 2013-04-28 DIAGNOSIS — J45909 Unspecified asthma, uncomplicated: Secondary | ICD-10-CM | POA: Insufficient documentation

## 2013-04-28 DIAGNOSIS — R519 Headache, unspecified: Secondary | ICD-10-CM

## 2013-04-28 DIAGNOSIS — F17200 Nicotine dependence, unspecified, uncomplicated: Secondary | ICD-10-CM | POA: Insufficient documentation

## 2013-04-28 DIAGNOSIS — Z8742 Personal history of other diseases of the female genital tract: Secondary | ICD-10-CM | POA: Insufficient documentation

## 2013-04-28 DIAGNOSIS — H9209 Otalgia, unspecified ear: Secondary | ICD-10-CM | POA: Insufficient documentation

## 2013-04-28 DIAGNOSIS — Z3202 Encounter for pregnancy test, result negative: Secondary | ICD-10-CM | POA: Insufficient documentation

## 2013-04-28 DIAGNOSIS — Z8659 Personal history of other mental and behavioral disorders: Secondary | ICD-10-CM | POA: Insufficient documentation

## 2013-04-28 DIAGNOSIS — R51 Headache: Secondary | ICD-10-CM | POA: Insufficient documentation

## 2013-04-28 DIAGNOSIS — Z87898 Personal history of other specified conditions: Secondary | ICD-10-CM | POA: Insufficient documentation

## 2013-04-28 DIAGNOSIS — H9202 Otalgia, left ear: Secondary | ICD-10-CM

## 2013-04-28 LAB — CBC WITH DIFFERENTIAL/PLATELET
Basophils Absolute: 0 10*3/uL (ref 0.0–0.1)
Basophils Relative: 0 % (ref 0–1)
Eosinophils Absolute: 0 10*3/uL (ref 0.0–0.7)
Eosinophils Relative: 0 % (ref 0–5)
HEMATOCRIT: 43.4 % (ref 36.0–46.0)
HEMOGLOBIN: 14.6 g/dL (ref 12.0–15.0)
LYMPHS ABS: 3.6 10*3/uL (ref 0.7–4.0)
Lymphocytes Relative: 31 % (ref 12–46)
MCH: 30.1 pg (ref 26.0–34.0)
MCHC: 33.6 g/dL (ref 30.0–36.0)
MCV: 89.5 fL (ref 78.0–100.0)
MONO ABS: 0.5 10*3/uL (ref 0.1–1.0)
MONOS PCT: 5 % (ref 3–12)
NEUTROS ABS: 7.5 10*3/uL (ref 1.7–7.7)
Neutrophils Relative %: 64 % (ref 43–77)
Platelets: 301 10*3/uL (ref 150–400)
RBC: 4.85 MIL/uL (ref 3.87–5.11)
RDW: 14 % (ref 11.5–15.5)
WBC: 11.6 10*3/uL — ABNORMAL HIGH (ref 4.0–10.5)

## 2013-04-28 LAB — URINALYSIS, ROUTINE W REFLEX MICROSCOPIC
Bilirubin Urine: NEGATIVE
GLUCOSE, UA: NEGATIVE mg/dL
Hgb urine dipstick: NEGATIVE
KETONES UR: NEGATIVE mg/dL
Nitrite: NEGATIVE
Protein, ur: NEGATIVE mg/dL
Specific Gravity, Urine: 1.028 (ref 1.005–1.030)
UROBILINOGEN UA: 0.2 mg/dL (ref 0.0–1.0)
pH: 7 (ref 5.0–8.0)

## 2013-04-28 LAB — COMPREHENSIVE METABOLIC PANEL
ALBUMIN: 3.9 g/dL (ref 3.5–5.2)
ALK PHOS: 76 U/L (ref 39–117)
ALT: 14 U/L (ref 0–35)
AST: 19 U/L (ref 0–37)
BUN: 12 mg/dL (ref 6–23)
CHLORIDE: 102 meq/L (ref 96–112)
CO2: 19 meq/L (ref 19–32)
CREATININE: 0.71 mg/dL (ref 0.50–1.10)
Calcium: 9.3 mg/dL (ref 8.4–10.5)
GLUCOSE: 130 mg/dL — AB (ref 70–99)
Potassium: 3.8 mEq/L (ref 3.7–5.3)
Sodium: 135 mEq/L — ABNORMAL LOW (ref 137–147)
Total Protein: 7.9 g/dL (ref 6.0–8.3)

## 2013-04-28 LAB — URINE MICROSCOPIC-ADD ON

## 2013-04-28 LAB — RAPID URINE DRUG SCREEN, HOSP PERFORMED
AMPHETAMINES: NOT DETECTED
BARBITURATES: NOT DETECTED
BENZODIAZEPINES: NOT DETECTED
Cocaine: POSITIVE — AB
Opiates: NOT DETECTED
Tetrahydrocannabinol: POSITIVE — AB

## 2013-04-28 LAB — PREGNANCY, URINE: Preg Test, Ur: NEGATIVE

## 2013-04-28 MED ORDER — DIPHENHYDRAMINE HCL 50 MG/ML IJ SOLN
25.0000 mg | Freq: Once | INTRAMUSCULAR | Status: DC
  Filled 2013-04-28: qty 1

## 2013-04-28 MED ORDER — KETOROLAC TROMETHAMINE 30 MG/ML IJ SOLN: 30.0000 mg | Freq: Once | INTRAMUSCULAR | Status: DC

## 2013-04-28 MED ORDER — KETOROLAC TROMETHAMINE 60 MG/2ML IM SOLN
60.0000 mg | Freq: Once | INTRAMUSCULAR | Status: AC
  Administered 2013-04-28: 60 mg via INTRAMUSCULAR
  Filled 2013-04-28: qty 2

## 2013-04-28 MED ORDER — ACETAMINOPHEN-CODEINE #3 300-30 MG PO TABS: 1.0000 | ORAL_TABLET | Freq: Four times a day (QID) | ORAL | Status: DC | PRN

## 2013-04-28 MED ORDER — ANTIPYRINE-BENZOCAINE 5.4-1.4 % OT SOLN
3.0000 [drp] | Freq: Once | OTIC | Status: AC
  Administered 2013-04-28: 4 [drp] via OTIC
  Filled 2013-04-28: qty 10

## 2013-04-28 MED ORDER — AMOXICILLIN-POT CLAVULANATE 875-125 MG PO TABS: 1.0000 | ORAL_TABLET | Freq: Two times a day (BID) | ORAL | Status: DC

## 2013-04-28 MED ORDER — HYDROMORPHONE HCL PF 1 MG/ML IJ SOLN
1.0000 mg | Freq: Once | INTRAMUSCULAR | Status: AC
  Administered 2013-04-28: 1 mg via INTRAMUSCULAR
  Filled 2013-04-28: qty 1

## 2013-04-28 MED ORDER — METOCLOPRAMIDE HCL 5 MG/ML IJ SOLN
10.0000 mg | Freq: Once | INTRAMUSCULAR | Status: AC
  Administered 2013-04-28: 10 mg via INTRAMUSCULAR

## 2013-04-28 MED ORDER — METOCLOPRAMIDE HCL 5 MG/ML IJ SOLN
10.0000 mg | Freq: Once | INTRAMUSCULAR | Status: DC
  Filled 2013-04-28: qty 2

## 2013-04-28 MED ORDER — SODIUM CHLORIDE 0.9 % IV SOLN: Freq: Once | INTRAVENOUS | Status: DC

## 2013-04-28 MED ORDER — CETIRIZINE-PSEUDOEPHEDRINE ER 5-120 MG PO TB12: 1.0000 | ORAL_TABLET | Freq: Every day | ORAL | Status: DC

## 2013-04-28 MED ORDER — LORAZEPAM 2 MG/ML IJ SOLN
2.0000 mg | Freq: Once | INTRAMUSCULAR | Status: AC
  Administered 2013-04-28: 2 mg via INTRAMUSCULAR

## 2013-04-28 MED ORDER — DIPHENHYDRAMINE HCL 50 MG/ML IJ SOLN
25.0000 mg | Freq: Once | INTRAMUSCULAR | Status: AC
  Administered 2013-04-28: 25 mg via INTRAMUSCULAR

## 2013-04-28 MED ORDER — LORAZEPAM 2 MG/ML IJ SOLN
2.0000 mg | Freq: Once | INTRAMUSCULAR | Status: DC
  Filled 2013-04-28: qty 1

## 2013-04-28 NOTE — ED Notes (Signed)
Phlebotomy at bedside.
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
## 2013-04-28 NOTE — ED Notes (Addendum)
Per EMS - pt started to have a HA on Thursday, progressively became worse. Today pain all on one side of her head. Pt c/o n/v and photophobia. Pt moaning and rolling around in the bed, unable to stay still. Pt crying. Pt not being cooperative. Nad, skin warm and dry, resp e/u. BP 143/85 HR 70-96, 100% on room air.

## 2013-04-28 NOTE — ED Notes (Signed)
Dr. Dorna Mai made aware that pts is still not cooperating.

## 2013-04-28 NOTE — ED Notes (Addendum)
Pt wont stay til to have BP taken again. Pt informed need to take vital signs prior to giving medicine. Pt agreed then when blood pressure cuff placed on pt, pt swinging arms around, rolling around in the bed. Not cooperating with staff. Boyfriend at bedside trying to calm pt down. Pt stands up and says she is "fucking done with this", pt attempting to leave. Informed PA pt wants to leave.

## 2013-04-28 NOTE — ED Notes (Addendum)
Attempted to get a urine sample and new set of vitals from pt. Pt refused. Kept pulling blankets over head and saying "stop it". Pt asked if she would please cooperate, pt said "no".

## 2013-04-28 NOTE — ED Provider Notes (Signed)
Medical screening examination/treatment/procedure(s) were conducted as a shared visit with non-physician practitioner(s) and myself.  I personally evaluated the patient during the encounter.   EKG Interpretation None     Patient here with headache which has a migraine-like quality to it. She's also had decreased sleep and increased stress. Will give patient IM medications. No concern for subarachnoid hemorrhage or intracranial process. She is very anxious and agitated at times. According to her boyfriend, this is been gradually progressive acid she has had less rest  Leota Jacobsen, MD 04/28/13 1347

## 2013-04-28 NOTE — Discharge Instructions (Signed)
Otalgia °The most common reason for this in children is an infection of the middle ear. Pain from the middle ear is usually caused by a build-up of fluid and pressure behind the eardrum. Pain from an earache can be sharp, dull, or burning. The pain may be temporary or constant. The middle ear is connected to the nasal passages by a short narrow tube called the Eustachian tube. The Eustachian tube allows fluid to drain out of the middle ear, and helps keep the pressure in your ear equalized. °CAUSES  °A cold or allergy can block the Eustachian tube with inflammation and the build-up of secretions. This is especially likely in small children, because their Eustachian tube is shorter and more horizontal. When the Eustachian tube closes, the normal flow of fluid from the middle ear is stopped. Fluid can accumulate and cause stuffiness, pain, hearing loss, and an ear infection if germs start growing in this area. °SYMPTOMS  °The symptoms of an ear infection may include fever, ear pain, fussiness, increased crying, and irritability. Many children will have temporary and minor hearing loss during and right after an ear infection. Permanent hearing loss is rare, but the risk increases the more infections a child has. Other causes of ear pain include retained water in the outer ear canal from swimming and bathing. °Ear pain in adults is less likely to be from an ear infection. Ear pain may be referred from other locations. Referred pain may be from the joint between your jaw and the skull. It may also come from a tooth problem or problems in the neck. Other causes of ear pain include: °· A foreign body in the ear. °· Outer ear infection. °· Sinus infections. °· Impacted ear wax. °· Ear injury. °· Arthritis of the jaw or TMJ problems. °· Middle ear infection. °· Tooth infections. °· Sore throat with pain to the ears. °DIAGNOSIS  °Your caregiver can usually make the diagnosis by examining you. Sometimes other special studies,  including x-rays and lab work may be necessary. °TREATMENT  °· If antibiotics were prescribed, use them as directed and finish them even if you or your child's symptoms seem to be improved. °· Sometimes PE tubes are needed in children. These are little plastic tubes which are put into the eardrum during a simple surgical procedure. They allow fluid to drain easier and allow the pressure in the middle ear to equalize. This helps relieve the ear pain caused by pressure changes. °HOME CARE INSTRUCTIONS  °· Only take over-the-counter or prescription medicines for pain, discomfort, or fever as directed by your caregiver. DO NOT GIVE CHILDREN ASPIRIN because of the association of Reye's Syndrome in children taking aspirin. °· Use a cold pack applied to the outer ear for 15-20 minutes, 03-04 times per day or as needed may reduce pain. Do not apply ice directly to the skin. You may cause frost bite. °· Over-the-counter ear drops used as directed may be effective. Your caregiver may sometimes prescribe ear drops. °· Resting in an upright position may help reduce pressure in the middle ear and relieve pain. °· Ear pain caused by rapidly descending from high altitudes can be relieved by swallowing or chewing gum. Allowing infants to suck on a bottle during airplane travel can help. °· Do not smoke in the house or near children. If you are unable to quit smoking, smoke outside. °· Control allergies. °SEEK IMMEDIATE MEDICAL CARE IF:  °· You or your child are becoming sicker. °· Pain or fever   relief is not obtained with medicine.  You or your child's symptoms (pain, fever, or irritability) do not improve within 24 to 48 hours or as instructed.  Severe pain suddenly stops hurting. This may indicate a ruptured eardrum.  You or your children develop new problems such as severe headaches, stiff neck, difficulty swallowing, or swelling of the face or around the ear. Document Released: 08/07/2003 Document Revised: 03/14/2011  Document Reviewed: 12/12/2007 [facility] Patient Information 2014 [place].    Sinus Headache A sinus headache is when your sinuses become clogged or swollen. Sinus headaches can range from mild to severe.  CAUSES A sinus headache can have different causes, such as:  Colds.  Sinus infections.  Allergies. SYMPTOMS  Symptoms of a sinus headache may vary and can include:  Headache.  Pain or pressure in the face.  Congested or runny nose.  Fever.  Inability to smell.  Pain in upper teeth. Weather changes can make symptoms worse. TREATMENT  The treatment of a sinus headache depends on the cause.  Sinus pain caused by a sinus infection may be treated with antibiotic medicine.  Sinus pain caused by allergies may be helped by allergy medicines (antihistamines) and medicated nasal sprays.  Sinus pain caused by congestion may be helped by flushing the nose and sinuses with saline solution. HOME CARE INSTRUCTIONS   If antibiotics are prescribed, take them as directed. Finish them even if you start to feel better.  Only take over-the-counter or prescription medicines for pain, discomfort, or fever as directed by your caregiver.  If you have congestion, use a nasal spray to help reduce pressure. SEEK IMMEDIATE MEDICAL CARE IF:  You have a fever.  You have headaches more than once a week.  You have sensitivity to light or sound.  You have repeated nausea and vomiting.  You have vision problems.  You have sudden, severe pain in your face or head.  You have a seizure.  You are confused.  Your sinus headaches do not get better after treatment. Many people think they have a sinus headache when they actually have migraines or tension headaches. MAKE SURE YOU:   Understand these instructions.  Will watch your condition.  Will get help right away if you are not doing well or get worse. Document Released: 01/28/2004 Document Revised: 03/14/2011 Document Reviewed:  03/20/2010 [facility] Patient Information 2014 [place].

## 2013-04-28 NOTE — ED Notes (Signed)
[employer], security, MD and PA all speaking with pt to go back into room. Pt laid down onto the floor crying and screaming. MD asked pt to get up and go into room. Pt finally agrees to get up and get into the bed. MD told pt prior to giving medication she needs to have her vital signs rechecked and needs to cooperate with staff. Pt agrees and let RN get BP. Pt explained medications we were going to give to her and how we were going to give them. Boyfriend at bedside holding pt's hand and trying to calm her down. Pt crying and screaming in the bed.

## 2013-04-28 NOTE — ED Notes (Signed)
Pt ear irrigated with normal saline. Pt tolerated this procedure well.

## 2013-04-28 NOTE — ED Notes (Addendum)
Pt screaming in hallway. Pt told this is inappropriate and she needs to go into her room or leave. Pt running in hallways. Security and [employer] called. Pt refusing to return to room.

## 2013-04-28 NOTE — ED Provider Notes (Signed)
Medical screening examination/treatment/procedure(s) were performed by non-physician practitioner and as supervising physician I was immediately available for consultation/collaboration.   EKG Interpretation None       Leota Jacobsen, MD 04/28/13 [phone]

## 2013-04-28 NOTE — ED Provider Notes (Signed)
CSN: [id number]     Arrival date & time 04/28/13  1235 History   First MD Initiated Contact with Patient 04/28/13 1251     Chief Complaint  Patient presents with  . Migraine     (Consider location/radiation/quality/duration/timing/severity/associated sxs/prior Treatment) Patient is a 33 y.o. female presenting with migraines. The history is provided by the patient. No language interpreter was used.  Migraine This is a new problem. The current episode started today. The problem occurs constantly. The problem has been gradually worsening. Associated symptoms include headaches. Nothing aggravates the symptoms. She has tried nothing for the symptoms. The treatment provided no relief.  Pt brought in by EMS.  Pt reported to be screaming and rolling on floor when they arrived.   Pt complains of a headache here.  Pt hyperventilating during history.  Friend reports pt has been stressed,  Not sleeping well and developed a a headache today    Past Medical History  Diagnosis Date  . Asthma   . Anxiety   . Fibroid   . Depression   . Ovarian cyst   . Abnormal Pap smear   . 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 Dilation and curettage of uterus    . Cesarean section    . Tubal ligation     Family History  Problem Relation Age of Onset  . Anesthesia problems Neg Hx    History  Substance Use Topics  . Smoking status: Current Some Day Smoker -- 1.00 packs/day for 12 years    Types: Cigarettes  . Smokeless tobacco: Not on file  . Alcohol Use: Yes   OB History   Grav Para Term Preterm Abortions TAB SAB Ect Mult Living   7 3 3  4 1 3         Review of Systems  Neurological: Positive for headaches.  All other systems reviewed and are negative.     Allergies  Percocet  Home Medications   Prior to Admission medications   Not on File   BP 119/90  Pulse 112  Temp(Src) 97.6 F (36.4  C) (Oral)  Resp 18  SpO2 99%  LMP 04/07/2013 Physical Exam  Nursing note and vitals reviewed. Constitutional: She is oriented to person, place, and time. She appears well-developed and well-nourished.  HENT:  Head: Normocephalic and atraumatic.  Right Ear: External ear normal.  Left Ear: External ear normal.  Eyes: Conjunctivae and EOM are normal. Pupils are equal, round, and reactive to light.  Neck: Normal range of motion. Neck supple.  Cardiovascular: Normal rate and normal heart sounds.   Pulmonary/Chest: Effort normal and breath sounds normal.  Abdominal: Soft. Bowel sounds are normal.  Musculoskeletal: Normal range of motion.  Neurological: She is alert and oriented to person, place, and time. She has normal reflexes.  Skin: Skin is warm.  Psychiatric:  Hostile,  anxious    ED Course  Procedures (including critical care time) Labs Review Labs Reviewed  CBC WITH DIFFERENTIAL  COMPREHENSIVE METABOLIC PANEL  URINALYSIS, ROUTINE W REFLEX MICROSCOPIC  PREGNANCY, URINE  URINE RAPID DRUG SCREEN (HOSP PERFORMED)    Imaging Review No results found.   EKG Interpretation None      MDM   Final diagnoses:  None    Pt very uncooperative with RN.   Dr. Zenia Resides in to see.   Labs ordered,  Ct scan ordered.  [place], PA-C 04/28/13 1502

## 2013-04-28 NOTE — ED Notes (Signed)
Attempted to gain IV access and administered medications. Pt refusing to stay still. Pt sticking finger down throat. Pt asked to stop this. Pt taking other hand and sticking it down throat then trying to grab staff with hands.

## 2013-04-28 NOTE — ED Notes (Signed)
Pt pleasant and thankful for care received at time of d/c. Pt helped to car by this RN via wheelchair.

## 2013-04-28 NOTE — ED Notes (Signed)
Pt ran out of room stating that no one will give her pain medication. Pt is back in room currently.

## 2013-04-28 NOTE — ED Notes (Signed)
Boyfriend asking about when they are going to leave. Pt is sleeping in bed, doesn't want to answer questions, just closes her eyes after trying to ask them. resp e/u, skin warm and dry. Pt refusing another set of vital signs.

## 2013-04-28 NOTE — ED Provider Notes (Signed)
Pt feels somewhat improved.  She is complaining mostly of left ear pain that radiates down her left neck.  I reviewed head CT and it is neg for acute.  Pt will arouse, is calm, sensical.  No stiff neck.  Left ear is tender on exam, almost like a severe otitis externa.  Ear wax present equally in both ears, but pt reports only pain in left ear.  Will irrigate left ear, give auralgan as well.  Will put on abx for sinusitis and/or inner ear infection and also refer to Neurology for follow up.  In the meantime, she can follow up with PMD Dr. Jeanie Cooks for more immediate outpt recheck following our treatment here.    Victoria Clayton. Dorna Mai, MD 04/28/13 2041

## 2013-04-28 NOTE — ED Notes (Addendum)
Attempted to have pt sign out AMA. Pt got out of bed and screamed " I ain't signing shit bitch". Pt informed there is no need to sign if it is going to make her upset and pointed in the direction of the exit.

## 2013-05-10 ENCOUNTER — Ambulatory Visit: Payer: Medicaid Other | Admitting: Advanced Practice Midwife

## 2013-11-04 ENCOUNTER — Encounter (HOSPITAL_COMMUNITY): Payer: Self-pay | Admitting: Emergency Medicine

## 2013-12-04 ENCOUNTER — Encounter (HOSPITAL_COMMUNITY): Payer: Self-pay | Admitting: Family Medicine

## 2013-12-04 ENCOUNTER — Emergency Department (HOSPITAL_COMMUNITY)
Admission: EM | Admit: 2013-12-04 | Discharge: 2013-12-04 | Disposition: A | Discharge status: Home / Self Care | Payer: Medicaid Other | Attending: Emergency Medicine | Admitting: Emergency Medicine

## 2013-12-04 ENCOUNTER — Emergency Department (HOSPITAL_COMMUNITY): Payer: Medicaid Other

## 2013-12-04 DIAGNOSIS — R05 Cough: Secondary | ICD-10-CM

## 2013-12-04 DIAGNOSIS — J45909 Unspecified asthma, uncomplicated: Secondary | ICD-10-CM | POA: Insufficient documentation

## 2013-12-04 DIAGNOSIS — Z862 Personal history of diseases of the blood and blood-forming organs and certain disorders involving the immune mechanism: Secondary | ICD-10-CM | POA: Insufficient documentation

## 2013-12-04 DIAGNOSIS — R059 Cough, unspecified: Secondary | ICD-10-CM

## 2013-12-04 DIAGNOSIS — R079 Chest pain, unspecified: Secondary | ICD-10-CM | POA: Insufficient documentation

## 2013-12-04 DIAGNOSIS — Z79899 Other long term (current) drug therapy: Secondary | ICD-10-CM | POA: Insufficient documentation

## 2013-12-04 DIAGNOSIS — Z720 Tobacco use: Secondary | ICD-10-CM | POA: Insufficient documentation

## 2013-12-04 DIAGNOSIS — Z8659 Personal history of other mental and behavioral disorders: Secondary | ICD-10-CM | POA: Insufficient documentation

## 2013-12-04 DIAGNOSIS — Z792 Long term (current) use of antibiotics: Secondary | ICD-10-CM | POA: Diagnosis not present

## 2013-12-04 DIAGNOSIS — M791 Myalgia: Secondary | ICD-10-CM | POA: Insufficient documentation

## 2013-12-04 DIAGNOSIS — R51 Headache: Secondary | ICD-10-CM | POA: Insufficient documentation

## 2013-12-04 DIAGNOSIS — Z8742 Personal history of other diseases of the female genital tract: Secondary | ICD-10-CM | POA: Diagnosis not present

## 2013-12-04 DIAGNOSIS — J111 Influenza due to unidentified influenza virus with other respiratory manifestations: Secondary | ICD-10-CM

## 2013-12-04 DIAGNOSIS — J029 Acute pharyngitis, unspecified: Secondary | ICD-10-CM | POA: Diagnosis present

## 2013-12-04 DIAGNOSIS — R69 Illness, unspecified: Secondary | ICD-10-CM

## 2013-12-04 MED ORDER — OSELTAMIVIR PHOSPHATE 75 MG PO CAPS: 75.0000 mg | ORAL_CAPSULE | Freq: Two times a day (BID) | ORAL | Status: DC

## 2013-12-04 MED ORDER — KETOROLAC TROMETHAMINE 30 MG/ML IJ SOLN
30.0000 mg | Freq: Once | INTRAMUSCULAR | Status: AC
  Administered 2013-12-04: 30 mg via INTRAVENOUS
  Filled 2013-12-04: qty 1

## 2013-12-04 MED ORDER — KETOROLAC TROMETHAMINE 10 MG PO TABS: 10.0000 mg | ORAL_TABLET | Freq: Four times a day (QID) | ORAL | Status: DC | PRN

## 2013-12-04 MED ORDER — SODIUM CHLORIDE 0.9 % IV BOLUS (SEPSIS)
1000.0000 mL | Freq: Once | INTRAVENOUS | Status: AC
  Administered 2013-12-04: 1000 mL via INTRAVENOUS

## 2013-12-04 MED ORDER — ONDANSETRON HCL 4 MG/2ML IJ SOLN
4.0000 mg | Freq: Once | INTRAMUSCULAR | Status: AC
  Administered 2013-12-04: 4 mg via INTRAVENOUS
  Filled 2013-12-04: qty 2

## 2013-12-04 MED ORDER — BENZONATATE 100 MG PO CAPS
200.0000 mg | ORAL_CAPSULE | Freq: Once | ORAL | Status: AC
  Administered 2013-12-04: 200 mg via ORAL
  Filled 2013-12-04: qty 2

## 2013-12-04 MED ORDER — ONDANSETRON 4 MG PO TBDP: ORAL_TABLET | ORAL | Status: DC

## 2013-12-04 NOTE — ED Notes (Signed)
Pt states has felt "bad since yesterday. Vomiting, loose stools, coughing, sore throat. Nasal congestion. No fever"

## 2013-12-04 NOTE — Discharge Instructions (Signed)

## 2013-12-04 NOTE — ED Notes (Signed)
Per pt sts yesterday she began having body aches, HA, sore throat, vomiting.

## 2013-12-04 NOTE — ED Provider Notes (Signed)
CSN: [id number]     Arrival date & time 12/04/13  9702 History   First MD Initiated Contact with Patient 12/04/13 [phone]     Chief Complaint  Patient presents with  . Generalized Body Aches  . Sore Throat  . Emesis     (Consider location/radiation/quality/duration/timing/severity/associated sxs/prior Treatment) Patient is a 33 y.o. female presenting with pharyngitis and vomiting. The history is provided by the patient.  Sore Throat This is a new problem. The current episode started yesterday. The problem occurs constantly. The problem has not changed since onset.Associated symptoms include chest pain (with coughing) and headaches. Pertinent negatives include no abdominal pain and no shortness of breath. Nothing aggravates the symptoms. Nothing relieves the symptoms. She has tried nothing for the symptoms.  Emesis Severity:  Moderate Duration:  1 day Timing:  Constant Quality:  Stomach contents Progression:  Unchanged Chronicity:  New Relieved by:  Nothing Exacerbated by: bending over. Associated symptoms: chills, cough, headaches, myalgias, sore throat and URI   Associated symptoms: no abdominal pain and no fever     Past Medical History  Diagnosis Date  . Asthma   . Anxiety   . Fibroid   . Depression   . Ovarian cyst   . Abnormal Pap smear   . 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 Dilation and curettage of uterus    . Cesarean section    . Tubal ligation     Family History  Problem Relation Age of Onset  . Anesthesia problems Neg Hx    History  Substance Use Topics  . Smoking status: Current Some Day Smoker -- 1.00 packs/day for 12 years    Types: Cigarettes  . Smokeless tobacco: Not on file  . Alcohol Use: Yes   OB History    Gravida Para Term Preterm AB TAB SAB Ectopic Multiple Living   7 3 3  4 1 3         Review of Systems  Constitutional: Positive for  chills.  HENT: Positive for sore throat.   Respiratory: Negative for shortness of breath.   Cardiovascular: Positive for chest pain (with coughing).  Gastrointestinal: Positive for vomiting. Negative for abdominal pain.  Musculoskeletal: Positive for myalgias.  Neurological: Positive for headaches.  All other systems reviewed and are negative.     Allergies  Percocet  Home Medications   Prior to Admission medications   Medication Sig Start Date End Date Taking? Authorizing Provider  acetaminophen-codeine (TYLENOL #3) 300-30 MG per tablet Take 1-2 tablets by mouth every 6 (six) hours as needed for moderate pain. 04/28/13   Saddie Benders. Ghim, MD  amoxicillin-clavulanate (AUGMENTIN) 875-125 MG per tablet Take 1 tablet by mouth 2 (two) times daily. 04/28/13   Saddie Benders. Ghim, MD  cetirizine-pseudoephedrine (ZYRTEC-D) 5-120 MG per tablet Take 1 tablet by mouth daily. 04/28/13   Saddie Benders. Ghim, MD   BP 107/55 mmHg  Pulse 74  Temp(Src) 97.9 F (36.6 C) (Oral)  Resp 16  SpO2 98%  LMP 11/09/2013 Physical Exam  Constitutional: She is oriented to person, place, and time. She appears well-developed and well-nourished. No distress.  HENT:  Head: Normocephalic and atraumatic.  Mouth/Throat: Posterior oropharyngeal erythema present. No oropharyngeal exudate or posterior oropharyngeal edema.  Eyes: EOM are normal. Pupils are equal, round, and reactive to light.  Neck: Normal range of motion. Neck supple.  Cardiovascular: Normal rate and  regular rhythm.  Exam reveals no friction rub.   No murmur heard. Pulmonary/Chest: Effort normal and breath sounds normal. No respiratory distress. She has no wheezes. She has no rales.  Abdominal: Soft. She exhibits no distension. There is no tenderness. There is no rebound.  Musculoskeletal: Normal range of motion. She exhibits no edema.  Neurological: She is alert and oriented to person, place, and time.  Skin: No rash noted. She is not diaphoretic.   Nursing note and vitals reviewed.   ED Course  Procedures (including critical care time) Labs Review Labs Reviewed - No data to display  Imaging Review Dg Chest 2 View  12/04/2013   CLINICAL DATA:  Cough.  EXAM: CHEST  2 VIEW  COMPARISON:  April 10, 2007.  FINDINGS: The heart size and mediastinal contours are within normal limits. Both lungs are clear. No pneumothorax or pleural effusion is noted. The visualized skeletal structures are unremarkable.  IMPRESSION: No acute cardiopulmonary abnormality seen.   Electronically Signed   By: Sabino Dick M.D.   On: 12/04/2013 11:25     EKG Interpretation None      MDM   Final diagnoses:  Cough  Influenza-like illness    33 year old female here with symptoms concerning for influenza. Dizziness with vomiting presented yesterday. Associated chills, myalgias, cough. No fever. Here vitals stable. Posterior pharyngeal erythema present. No adventitious lung sounds. Plan for fluids, antibiotics, Toradol, chest x-ray. Feeling better after interventions. CXR negative. Given tamiflu, toradol, zofran.     Evelina Bucy, MD 12/04/13 1537

## 2014-02-04 ENCOUNTER — Encounter (HOSPITAL_COMMUNITY): Payer: Self-pay | Admitting: Emergency Medicine

## 2014-02-04 ENCOUNTER — Emergency Department (HOSPITAL_COMMUNITY)
Admission: EM | Admit: 2014-02-04 | Discharge: 2014-02-04 | Disposition: A | Discharge status: Home / Self Care | Payer: Medicaid Other | Attending: Emergency Medicine | Admitting: Emergency Medicine

## 2014-02-04 ENCOUNTER — Emergency Department (HOSPITAL_COMMUNITY): Payer: Medicaid Other

## 2014-02-04 DIAGNOSIS — Z8659 Personal history of other mental and behavioral disorders: Secondary | ICD-10-CM | POA: Insufficient documentation

## 2014-02-04 DIAGNOSIS — J45909 Unspecified asthma, uncomplicated: Secondary | ICD-10-CM | POA: Diagnosis not present

## 2014-02-04 DIAGNOSIS — Z8742 Personal history of other diseases of the female genital tract: Secondary | ICD-10-CM | POA: Diagnosis not present

## 2014-02-04 DIAGNOSIS — Z792 Long term (current) use of antibiotics: Secondary | ICD-10-CM | POA: Diagnosis not present

## 2014-02-04 DIAGNOSIS — W108XXA Fall (on) (from) other stairs and steps, initial encounter: Secondary | ICD-10-CM | POA: Diagnosis not present

## 2014-02-04 DIAGNOSIS — S99911A Unspecified injury of right ankle, initial encounter: Secondary | ICD-10-CM | POA: Diagnosis present

## 2014-02-04 DIAGNOSIS — Y998 Other external cause status: Secondary | ICD-10-CM | POA: Diagnosis not present

## 2014-02-04 DIAGNOSIS — Z720 Tobacco use: Secondary | ICD-10-CM | POA: Diagnosis not present

## 2014-02-04 DIAGNOSIS — Y9289 Other specified places as the place of occurrence of the external cause: Secondary | ICD-10-CM | POA: Diagnosis not present

## 2014-02-04 DIAGNOSIS — Y9389 Activity, other specified: Secondary | ICD-10-CM | POA: Insufficient documentation

## 2014-02-04 NOTE — ED Provider Notes (Signed)
CSN: [id number]     Arrival date & time 02/04/14  2831 History  This chart was scribed for non-physician practitioner Alvina Chou, working with Hoy Morn, MD by Donato Schultz, ED Scribe. This patient was seen in room TR10C/TR10C and the patient's care was started at 9:44 AM.    Chief Complaint  Patient presents with  . Ankle Pain   HPI Comments: Victoria Clayton is a 34 y.o. female who presents to the Emergency Department complaining of constant, burning right ankle pain that started after she slipped and fell going up stairs.  She states that she feels well enough to work and is in need of a return to work note.  She has a history of bilateral ankle fractures.     Patient is a 34 y.o. female presenting with ankle pain. The history is provided by the patient. No language interpreter was used.  Ankle Pain   Past Medical History  Diagnosis Date  . Asthma   . Anxiety   . Fibroid   . Depression   . Ovarian cyst   . Abnormal Pap smear   . 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 Dilation and curettage of uterus    . Cesarean section    . Tubal ligation     Family History  Problem Relation Age of Onset  . Anesthesia problems Neg Hx    History  Substance Use Topics  . Smoking status: Current Some Day Smoker -- 1.00 packs/day for 12 years    Types: Cigarettes  . Smokeless tobacco: Not on file  . Alcohol Use: Yes   OB History    Gravida Para Term Preterm AB TAB SAB Ectopic Multiple Living   7 3 3  4 1 3         Review of Systems  Musculoskeletal: Positive for arthralgias. Negative for gait problem.  All other systems reviewed and are negative.     Allergies  Percocet  Home Medications   Prior to Admission medications   Medication Sig Start Date End Date Taking? Authorizing Provider  acetaminophen-codeine (TYLENOL #3) 300-30 MG per tablet Take 1-2 tablets  by mouth every 6 (six) hours as needed for moderate pain. Patient not taking: Reported on 12/04/2013 04/28/13   Saddie Benders. Ghim, MD  amoxicillin-clavulanate (AUGMENTIN) 875-125 MG per tablet Take 1 tablet by mouth 2 (two) times daily. Patient not taking: Reported on 12/04/2013 04/28/13   Saddie Benders. Ghim, MD  ibuprofen (ADVIL,MOTRIN) 400 MG tablet Take 400 mg by mouth every 6 (six) hours as needed.    Historical Provider, MD  ketorolac (TORADOL) 10 MG tablet Take 1 tablet (10 mg total) by mouth every 6 (six) hours as needed. 12/04/13   Evelina Bucy, MD  ondansetron (ZOFRAN ODT) 4 MG disintegrating tablet 4mg  ODT q4 hours prn nausea/vomit 12/04/13   Evelina Bucy, MD  oseltamivir (TAMIFLU) 75 MG capsule Take 1 capsule (75 mg total) by mouth every 12 (twelve) hours. 12/04/13   Evelina Bucy, MD   BP 128/84 mmHg  Pulse 74  Temp(Src) 98.3 F (36.8 C) (Oral)  Resp 18  SpO2 99% Physical Exam  Constitutional: She is oriented to person, place, and time. She appears well-developed and well-nourished. No distress.  HENT:  Head: Normocephalic and atraumatic.  Eyes: Conjunctivae and EOM are normal.  Neck: Normal range of motion.  Cardiovascular: Normal rate and regular rhythm.  Exam reveals no gallop and no friction rub.   No murmur heard. Pulmonary/Chest: Effort normal and breath sounds normal. She has no wheezes. She has no rales. She exhibits no tenderness.  Abdominal: Soft. There is no tenderness.  Musculoskeletal:  Slightly limited ROM of right ankle. Pain with dorsiflexion and plantar flexion. No obvious deformity or edema noted. Patient is able to wiggle toes.   Neurological: She is alert and oriented to person, place, and time. Coordination normal.  Speech is goal-oriented. Moves limbs without ataxia.   Skin: Skin is warm and dry.  Psychiatric: She has a normal mood and affect. Her behavior is normal.  Nursing note and vitals reviewed.   ED Course  Procedures (including critical care  time)  DIAGNOSTIC STUDIES: Oxygen Saturation is 99% on room air, normal by my interpretation.    COORDINATION OF CARE: 9:46 AM- Will discharge the patient with an ankle brace and return to work note.  The patient agreed to the treatment plan.    Labs Review Labs Reviewed - No data to display  Imaging Review Dg Ankle Complete Right  02/04/2014   CLINICAL DATA:  Slipped and hit anterior ankle on steps. Anterior pain.  EXAM: RIGHT ANKLE - COMPLETE 3+ VIEW  COMPARISON:  03/27/2012  FINDINGS: There is no evidence of fracture, dislocation, or joint effusion. There is no evidence of arthropathy or other focal bone abnormality. Soft tissues are unremarkable.  IMPRESSION: Negative.   Electronically Signed   By: Shon Hale M.D.   On: 02/04/2014 86:76   SPLINT APPLICATION Date/Time: 72:09 AM Authorized by: Alvina Chou Consent: Verbal consent obtained. Risks and benefits: risks, benefits and alternatives were discussed Consent given by: patient Splint applied by: orthopedic technician Location details: right ankle Splint type: ASO brace Supplies used: ASO brace Post-procedure: The splinted body part was neurovascularly unchanged following the procedure. Patient tolerance: Patient tolerated the procedure well with no immediate complications.      EKG Interpretation None      MDM   Final diagnoses:  Right ankle injury, initial encounter    10:07 AM Patient's xray unremarkable for acute changes. Patient will have ASO splint on the right. No neurovascular compromise. Patient instructed to RICE. No other injury.   I personally performed the services described in this documentation, which was scribed in my presence. The recorded information has been reviewed and is accurate.    Alvina Chou, PA-C 02/04/14 [place], MD 02/04/14 [phone]

## 2014-02-04 NOTE — ED Notes (Signed)
Patient states she has bad ankles, both having been broken previously.   Patient states "I slipped and now it's hurting more, but my work has to have a note stating that I can come back".

## 2014-02-04 NOTE — Discharge Instructions (Signed)
Rest, ice and elevate your ankle for symptom relief. Refer to attached documents for more information.

## 2014-02-04 NOTE — ED Notes (Signed)
ORTHO at bedside to place ASO for patient comfort.

## 2014-02-04 NOTE — Progress Notes (Signed)
Orthopedic Tech Progress Note Patient Details:  Victoria Clayton 10-18-1980 [id number]  Ortho Devices Type of Ortho Device: ASO Ortho Device/Splint Location: rle Ortho Device/Splint Interventions: Application   Marshelle Bilger 02/04/2014, 10:05 AM

## 2014-12-21 ENCOUNTER — Inpatient Hospital Stay (HOSPITAL_COMMUNITY)
Admission: AD | Admit: 2014-12-21 | Discharge: 2014-12-21 | Disposition: A | Discharge status: Home / Self Care | Payer: Medicaid Other | Source: Ambulatory Visit | Attending: Obstetrics & Gynecology | Admitting: Obstetrics & Gynecology

## 2014-12-21 ENCOUNTER — Encounter (HOSPITAL_COMMUNITY): Payer: Self-pay | Admitting: *Deleted

## 2014-12-21 DIAGNOSIS — F1490 Cocaine use, unspecified, uncomplicated: Secondary | ICD-10-CM | POA: Diagnosis present

## 2014-12-21 DIAGNOSIS — N3090 Cystitis, unspecified without hematuria: Secondary | ICD-10-CM | POA: Diagnosis not present

## 2014-12-21 DIAGNOSIS — N939 Abnormal uterine and vaginal bleeding, unspecified: Secondary | ICD-10-CM | POA: Diagnosis not present

## 2014-12-21 LAB — POCT PREGNANCY, URINE: PREG TEST UR: NEGATIVE

## 2014-12-21 LAB — RAPID URINE DRUG SCREEN, HOSP PERFORMED
AMPHETAMINES: NOT DETECTED
Barbiturates: NOT DETECTED
Benzodiazepines: NOT DETECTED
Cocaine: POSITIVE — AB
Opiates: NOT DETECTED
Tetrahydrocannabinol: POSITIVE — AB

## 2014-12-21 LAB — CBC
HCT: 40.4 % (ref 36.0–46.0)
Hemoglobin: 13.5 g/dL (ref 12.0–15.0)
MCH: 29.9 pg (ref 26.0–34.0)
MCHC: 33.4 g/dL (ref 30.0–36.0)
MCV: 89.6 fL (ref 78.0–100.0)
PLATELETS: 363 10*3/uL (ref 150–400)
RBC: 4.51 MIL/uL (ref 3.87–5.11)
RDW: 14.2 % (ref 11.5–15.5)
WBC: 6.5 10*3/uL (ref 4.0–10.5)

## 2014-12-21 LAB — URINE MICROSCOPIC-ADD ON: Bacteria, UA: NONE SEEN

## 2014-12-21 LAB — URINALYSIS, ROUTINE W REFLEX MICROSCOPIC
Bilirubin Urine: NEGATIVE
Bilirubin Urine: NEGATIVE
GLUCOSE, UA: NEGATIVE mg/dL
Glucose, UA: NEGATIVE mg/dL
KETONES UR: NEGATIVE mg/dL
Ketones, ur: NEGATIVE mg/dL
LEUKOCYTES UA: NEGATIVE
Leukocytes, UA: NEGATIVE
NITRITE: NEGATIVE
Nitrite: NEGATIVE
PROTEIN: NEGATIVE mg/dL
Protein, ur: NEGATIVE mg/dL
SPECIFIC GRAVITY, URINE: 1.02 (ref 1.005–1.030)
Specific Gravity, Urine: 1.015 (ref 1.005–1.030)
pH: 6 (ref 5.0–8.0)
pH: 6 (ref 5.0–8.0)

## 2014-12-21 LAB — WET PREP, GENITAL
Clue Cells Wet Prep HPF POC: NONE SEEN
SPERM: NONE SEEN
Trich, Wet Prep: NONE SEEN
YEAST WET PREP: NONE SEEN

## 2014-12-21 MED ORDER — SULFAMETHOXAZOLE-TRIMETHOPRIM 800-160 MG PO TABS: 1.0000 | ORAL_TABLET | Freq: Two times a day (BID) | ORAL | Status: DC

## 2014-12-21 NOTE — Discharge Instructions (Signed)
Dysfunctional Uterine Bleeding Dysfunctional uterine bleeding is abnormal bleeding from the uterus. Dysfunctional uterine bleeding includes:  A period that comes earlier or later than usual.  A period that is lighter, heavier, or has blood clots.  Bleeding between periods.  Skipping one or more periods.  Bleeding after sexual intercourse.  Bleeding after menopause. HOME CARE INSTRUCTIONS  Pay attention to any changes in your symptoms. Follow these instructions to help with your condition: Eating  Eat well-balanced meals. Include foods that are high in iron, such as liver, meat, shellfish, green leafy vegetables, and eggs.  If you become constipated:  Drink plenty of water.  Eat fruits and vegetables that are high in water and fiber, such as spinach, carrots, raspberries, apples, and mango. Medicines  Take over-the-counter and prescription medicines only as told by your health care provider.  Do not change medicines without talking with your health care provider.  Aspirin or medicines that contain aspirin may make the bleeding worse. Do not take those medicines:  During the week before your period.  During your period.  If you were prescribed iron pills, take them as told by your health care provider. Iron pills help to replace iron that your body loses because of this condition. Activity  If you need to change your sanitary pad or tampon more than one time every 2 hours:  Lie in bed with your feet raised (elevated).  Place a cold pack on your lower abdomen.  Rest as much as possible until the bleeding stops or slows down.  Do not try to lose weight until the bleeding has stopped and your blood iron level is back to normal. Other Instructions  For two months, write down:  When your period starts.  When your period ends.  When any abnormal bleeding occurs.  What problems you notice.  Keep all follow up visits as told by your health care provider. This is  important. SEEK MEDICAL CARE IF:  You get light-headed or weak.  You have nausea and vomiting.  You cannot eat or drink without vomiting.  You feel dizzy or have diarrhea while you are taking medicines.  You are taking birth control pills or hormones, and you want to change them or stop taking them. SEEK IMMEDIATE MEDICAL CARE IF:  You develop a fever or chills.  You need to change your sanitary pad or tampon more than one time per hour.  Your bleeding becomes heavier, or your flow contains clots more often.  You develop pain in your abdomen.  You lose consciousness.  You develop a rash.   This information is not intended to replace advice given to you by your health care provider. Make sure you discuss any questions you have with your health care provider.   Document Released: 12/18/1999 Document Revised: 09/10/2014 Document Reviewed: 03/17/2014 Elsevier Interactive Patient Education 2016 Else[employer]  Urinary Tract Infection Urinary tract infections (UTIs) can develop anywhere along your urinary tract. Your urinary tract is your body's drainage system for removing wastes and extra water. Your urinary tract includes two kidneys, two ureters, a bladder, and a urethra. Your kidneys are a pair of bean-shaped organs. Each kidney is about the size of your fist. They are located below your ribs, one on each side of your spine. CAUSES Infections are caused by microbes, which are microscopic organisms, including fungi, viruses, and bacteria. These organisms are so small that they can only be seen through a microscope. Bacteria are the microbes that most commonly cause UTIs.  SYMPTOMS  Symptoms of UTIs may vary by age and gender of the patient and by the location of the infection. Symptoms in young women typically include a frequent and intense urge to urinate and a painful, burning feeling in the bladder or urethra during urination. Older women and men are more likely to be tired,  shaky, and weak and have muscle aches and abdominal pain. A fever may mean the infection is in your kidneys. Other symptoms of a kidney infection include pain in your back or sides below the ribs, nausea, and vomiting. DIAGNOSIS To diagnose a UTI, your caregiver will ask you about your symptoms. Your caregiver will also ask you to provide a urine sample. The urine sample will be tested for bacteria and white blood cells. White blood cells are made by your body to help fight infection. TREATMENT  Typically, UTIs can be treated with medication. Because most UTIs are caused by a bacterial infection, they usually can be treated with the use of antibiotics. The choice of antibiotic and length of treatment depend on your symptoms and the type of bacteria causing your infection. HOME CARE INSTRUCTIONS  If you were prescribed antibiotics, take them exactly as your caregiver instructs you. Finish the medication even if you feel better after you have only taken some of the medication.  Drink enough water and fluids to keep your urine clear or pale yellow.  Avoid caffeine, tea, and carbonated beverages. They tend to irritate your bladder.  Empty your bladder often. Avoid holding urine for long periods of time.  Empty your bladder before and after sexual intercourse.  After a bowel movement, women should cleanse from front to back. Use each tissue only once. SEEK MEDICAL CARE IF:   You have back pain.  You develop a fever.  Your symptoms do not begin to resolve within 3 days. SEEK IMMEDIATE MEDICAL CARE IF:   You have severe back pain or lower abdominal pain.  You develop chills.  You have nausea or vomiting.  You have continued burning or discomfort with urination. MAKE SURE YOU:   Understand these instructions.  Will watch your condition.  Will get help right away if you are not doing well or get worse.   This information is not intended to replace advice given to you by your health  care provider. Make sure you discuss any questions you have with your health care provider.   Document Released: 09/29/2004 Document Revised: 09/10/2014 Document Reviewed: 01/28/2011 Elsevier Interactive Patient Education [employer].

## 2014-12-21 NOTE — MAU Provider Note (Signed)
Chief Complaint: Vaginal Bleeding  First Provider Initiated Contact with Patient 12/21/14 1730     SUBJECTIVE HPI: Victoria Clayton is a 34 y.o. [id number] female who presents to [facility] reporting light vaginal bleeding mixed w/ mucus x 3 days, occasional sharp low abd pain and squirting of urine when pain occurs. Pain and bleeding are not like her normal periods. Patient states her last normal menstrual period was last week. She normally has monthly cycles, but occasionally has 2 periods per month. States she has been in a mutually monogamous relationship 7 years. Pain feels like what she has had with inguinal hernias in the past. Had left hernia surgery, but has not needed surgery on the right hernia.  Location: Suprapubic and right groin Quality: Sharp Severity: 5/10 on pain scale when it occurs. 0/10 pain now. Duration: 3 days Course: Unchanged Timing: Intermittent Modifying factors: Nothing. Hasn't tried anything for the pain. Associated signs and symptoms: Positive for vaginal bleeding and leaking of urine. Negative for fever, chills, dysuria, flank pain, nausea, vomiting, diarrhea, constipation, vaginal odor or vaginal itching.  Past Medical History  Diagnosis Date  . Asthma   . Anxiety   . Fibroid   . Depression   . Ovarian cyst   . Abnormal Pap smear   . 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 Dilation and curettage of uterus    . Cesarean section    . Tubal ligation    Left inguinal hernia repair  Social History   Social History  . Marital Status: Legally Separated     Spouse Name: N/A  . Number of Children: N/A  . Years of Education: N/A   Occupational History  . Not on file.   Social History Main Topics  . Smoking status: Current Some Day Smoker -- 1.00 packs/day for 12 years    Types: Cigarettes  . Smokeless tobacco: Not on file  . Alcohol Use: Yes  . Drug Use: 3.00 per week    Special: Marijuana     Comment: last used Sunday  . Sexual Activity: Yes    Birth Control/ Protection: Surgical   Other Topics Concern  . Not on file   Social History Narrative   No current facility-administered medications on file prior to encounter.   No current outpatient prescriptions on file prior to encounter.   Allergies  Allergen Reactions  . Percocet [Oxycodone-Acetaminophen] Hives and Itching    I have reviewed the past Medical Hx, Surgical Hx, Social Hx, Allergies and Medications.   Review of Systems  Constitutional: Negative for fever and chills.  Gastrointestinal: Positive for abdominal pain. Negative for nausea, vomiting, diarrhea  and constipation.  Genitourinary: Positive for vaginal bleeding, vaginal pain and menstrual problem. Negative for dysuria, urgency, frequency, hematuria, flank pain, decreased urine volume, vaginal discharge, difficulty urinating and dyspareunia.       Leaking of urine  Musculoskeletal: Negative for back pain.    OBJECTIVE Patient Vitals for the past 24 hrs:  BP Temp Temp src Pulse Resp  12/21/14 1635 117/68 mmHg 98.4 F (36.9 C) Oral 83 18   Constitutional: Well-developed, well-nourished female in no acute distress.  Cardiovascular: normal rate Respiratory: normal rate and effort.  GI: Abd soft, non-tender. Inguinal hernias not palpated. MS: Extremities nontender, no edema, normal ROM Neurologic: Alert and oriented x 4.  GU: Neg CVAT.  SPECULUM EXAM: NEFG, malodorous discharge, small-moderate amount of dark red blood mixed with mucus noted, cervix clean  BIMANUAL: cervix closed; unable to assess uterine size  due to body habitus. Not obviously enlarged. no adnexal masses. Mild left adnexal tenderness. No CMT.  LAB RESULTS Results for orders placed or performed during the hospital encounter of 12/21/14 (from the past 24 hour(s))  Urinalysis, Routine w reflex microscopic (not at [facility])     Status: Abnormal   Collection Time: 12/21/14  4:20 PM  Result Value Ref Range   Color, Urine YELLOW YELLOW   APPearance CLEAR CLEAR   Specific Gravity, Urine 1.020 1.005 - 1.030   pH 6.0 5.0 - 8.0   Glucose, UA NEGATIVE NEGATIVE mg/dL   Hgb urine dipstick LARGE (A) NEGATIVE   Bilirubin Urine NEGATIVE NEGATIVE   Ketones, ur NEGATIVE NEGATIVE mg/dL   Protein, ur NEGATIVE NEGATIVE mg/dL   Nitrite NEGATIVE NEGATIVE   Leukocytes, UA NEGATIVE NEGATIVE  Urine rapid drug screen (hosp performed)     Status: Abnormal   Collection Time: 12/21/14  4:20 PM  Result Value Ref Range   Opiates NONE DETECTED NONE DETECTED   Cocaine POSITIVE (A) NONE DETECTED   Benzodiazepines NONE DETECTED NONE DETECTED   Amphetamines NONE DETECTED NONE DETECTED   Tetrahydrocannabinol POSITIVE (A) NONE DETECTED   Barbiturates NONE DETECTED NONE DETECTED  Urine microscopic-add on     Status: Abnormal   Collection Time: 12/21/14  4:20 PM  Result Value Ref Range   Squamous Epithelial / LPF 0-5 (A) NONE SEEN   WBC, UA 0-5 0 - 5 WBC/hpf   RBC / HPF 6-30 0 - 5 RBC/hpf   Bacteria, UA FEW (A) NONE SEEN  Pregnancy, urine POC     Status: None   Collection Time: 12/21/14  4:50 PM  Result Value Ref Range   Preg Test, Ur NEGATIVE NEGATIVE  CBC     Status: None   Collection Time: 12/21/14  5:03 PM  Result Value Ref Range   WBC 6.5 4.0 - 10.5 K/uL   RBC 4.51 3.87 - 5.11 MIL/uL   Hemoglobin 13.5 12.0 - 15.0 g/dL   HCT 40.4 36.0 - 46.0 %   MCV 89.6 78.0 - 100.0 fL   MCH 29.9 26.0 - 34.0 pg   MCHC 33.4 30.0 - 36.0 g/dL   RDW 14.2 11.5 - 15.5 %   Platelets 363 150 - 400 K/uL  Wet prep, genital     Status: Abnormal   Collection Time: 12/21/14   5:32 PM  Result Value Ref Range   Yeast Wet Prep HPF POC NONE SEEN NONE SEEN   Trich, Wet Prep NONE SEEN NONE SEEN   Clue Cells Wet Prep HPF POC NONE SEEN NONE SEEN   WBC, Wet Prep HPF POC FEW (  A) NONE SEEN   Sperm NONE SEEN   Urinalysis, Routine w reflex microscopic (not at [facility])     Status: Abnormal   Collection Time: 12/21/14  6:00 PM  Result Value Ref Range   Color, Urine YELLOW YELLOW   APPearance CLEAR CLEAR   Specific Gravity, Urine 1.015 1.005 - 1.030   pH 6.0 5.0 - 8.0   Glucose, UA NEGATIVE NEGATIVE mg/dL   Hgb urine dipstick LARGE (A) NEGATIVE   Bilirubin Urine NEGATIVE NEGATIVE   Ketones, ur NEGATIVE NEGATIVE mg/dL   Protein, ur NEGATIVE NEGATIVE mg/dL   Nitrite NEGATIVE NEGATIVE   Leukocytes, UA NEGATIVE NEGATIVE  Urine microscopic-add on     Status: Abnormal   Collection Time: 12/21/14  6:00 PM  Result Value Ref Range   Squamous Epithelial / LPF 0-5 (A) NONE SEEN   WBC, UA 0-5 0 - 5 WBC/hpf   RBC / HPF 0-5 0 - 5 RBC/hpf   Bacteria, UA NONE SEEN NONE SEEN   UA repeated due to contamination with menstrual blood.  IMAGING No results found.  MAU COURSE UA, UPT, wet prep, GC/chlamydia cultures, CBC, HIV, UDS, speculum exam.  MDM 34 year old nonpregnant female with probable anovulatory bleeding and UTI. No evidence of emergent condition. No evidence of incarcerated hernia.  ASSESSMENT 1. Abnormal uterine bleeding   2. Bladder infection   3.      Cocaine use  PLAN Discharge home in stable condition. Encouraged to establish care with gynecologist for ongoing menstrual issues and routine GYN care. Rx Bactrim  Follow-up Information    Follow up with Gynecologist of your choice.   Why:  For non-emergent gynecology care      Follow up with [place].   Why:  As needed in gynecologic emergencies   Contact information:   [id number] [place] [phone]         Medication List    TAKE these medications        ibuprofen 200 MG tablet  Commonly known as:  ADVIL,MOTRIN  Take 800 mg by mouth every 6 (six) hours as needed for headache or mild pain.     sulfamethoxazole-trimethoprim 800-160 MG tablet  Commonly known as:  BACTRIM DS,SEPTRA DS  Take 1 tablet by mouth 2 (two) times daily.       [place], CNM 12/21/2014  5:30 PM

## 2014-12-21 NOTE — MAU Note (Signed)
Pt states she has been bleeding for the last two days.  She states she had her period last week.  When she wipes she has clumps of bloody mucus.  She reports previous surgeries for a hernia on her left side.  She said she was told that she has a hernia on her right side but they were not going to do surgery unless they worsened or she began to have symptoms.  She additionally reports a sharp pain in her vagina and lower abdomen and she says when she has it she pees on herself.

## 2014-12-22 LAB — HIV ANTIBODY (ROUTINE TESTING W REFLEX): HIV Screen 4th Generation wRfx: NONREACTIVE

## 2014-12-22 LAB — GC/CHLAMYDIA PROBE AMP (~~LOC~~) NOT AT ARMC
CHLAMYDIA, DNA PROBE: NEGATIVE
Neisseria Gonorrhea: NEGATIVE

## 2015-01-07 IMAGING — CR DG TIBIA/FIBULA 2V*R*
4 series · 4 of 4 positions shown · non-contrast
Comparison: None

CLINICAL DATA: Fell.  Right leg pain.

RIGHT TIBIA AND FIBULA - 2 VIEW

[x tib-fib ap right]
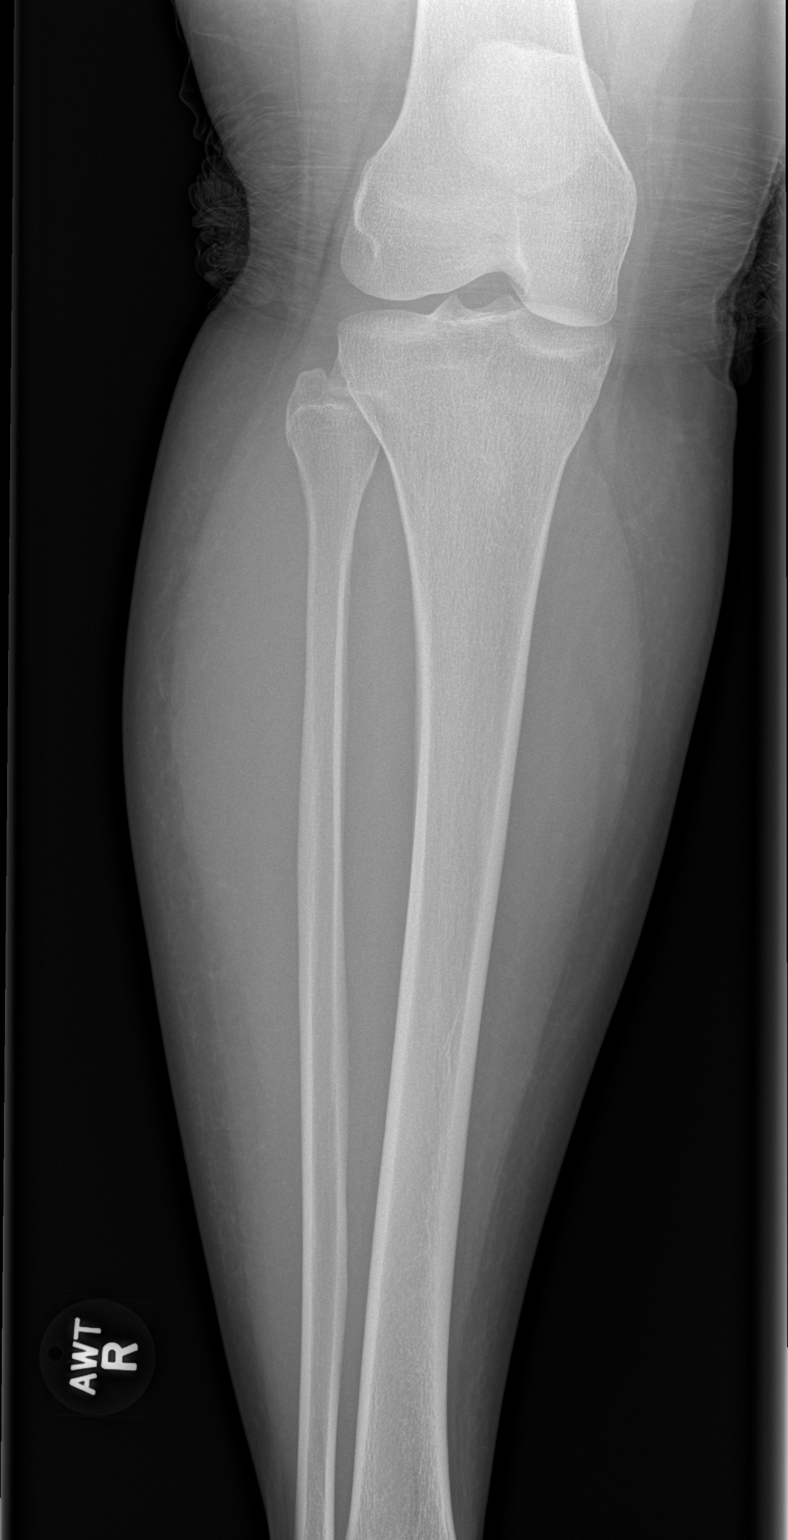

[x tib-fib lat right (1 of 3)]
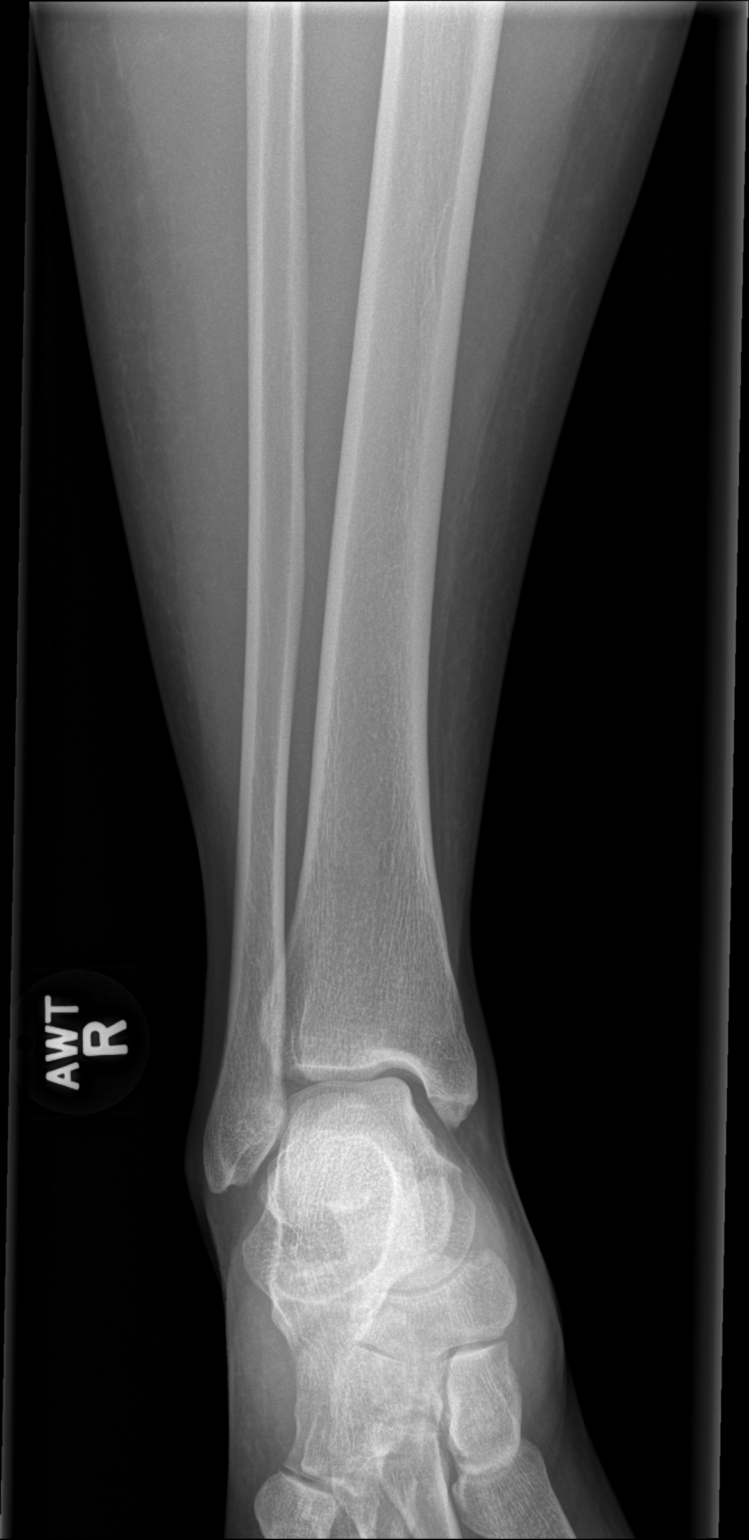

[x tib-fib lat right (2 of 3)]
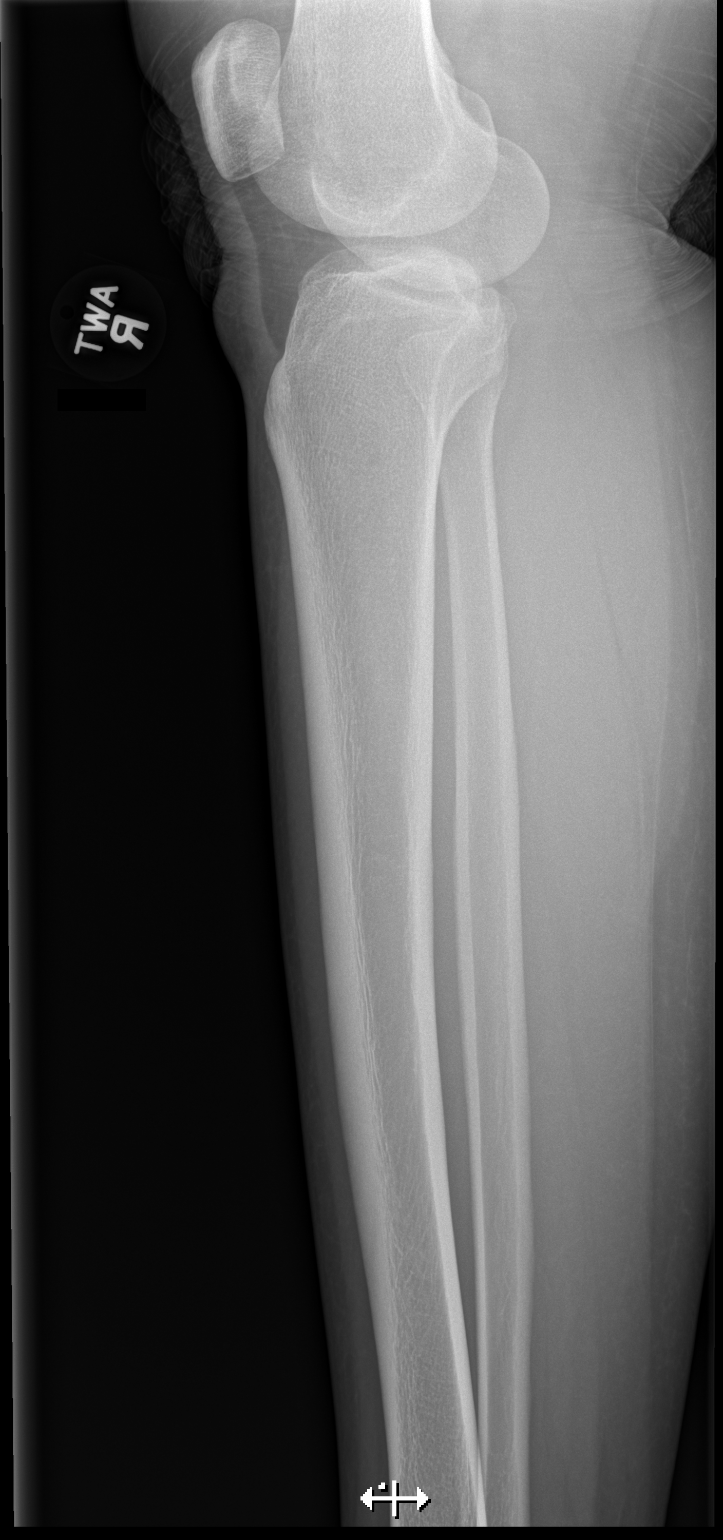

[x tib-fib lat right (3 of 3)]
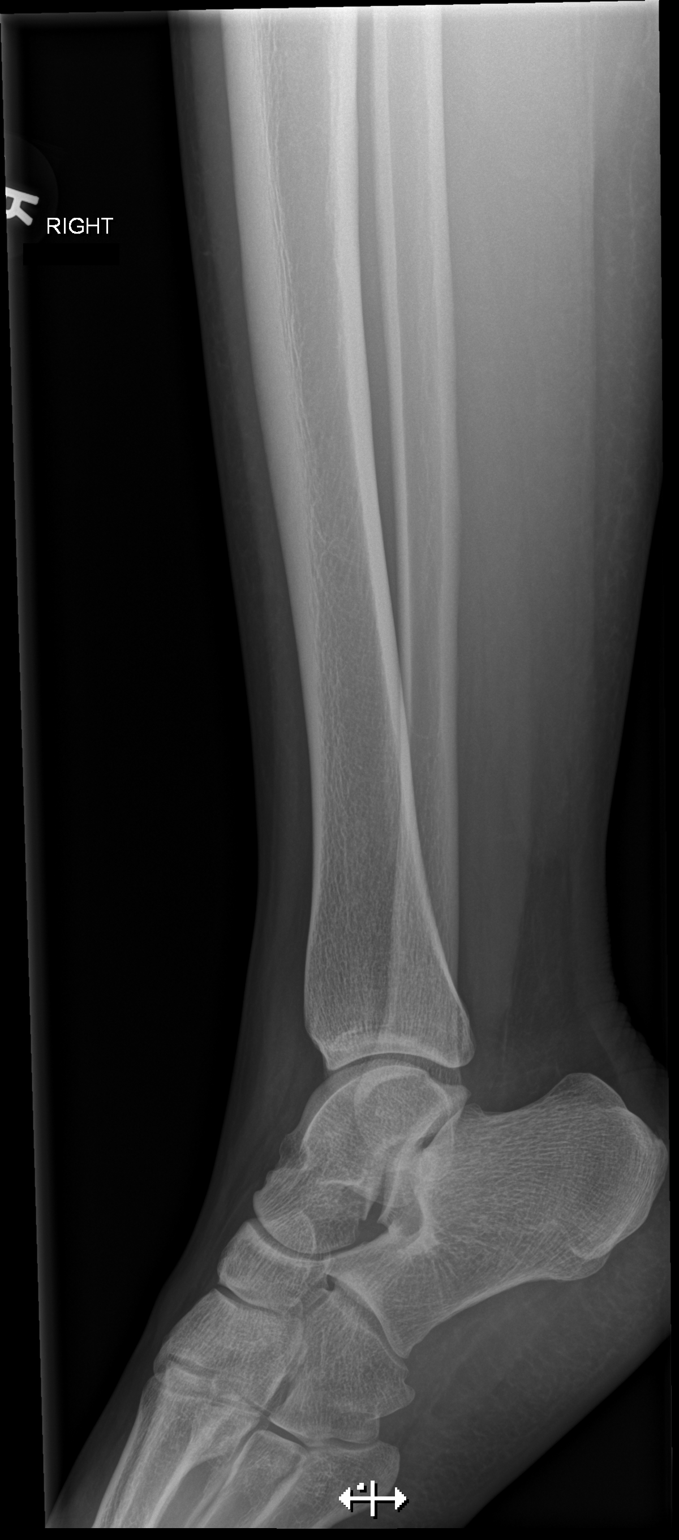

[4 of 4 positions shown; findings below may reference images not displayed]

FINDINGS: The knee and ankle joints are maintained.  No acute
fracture of the tibia or fibula is identified.
IMPRESSION: No acute bony findings.

## 2016-05-04 ENCOUNTER — Encounter (HOSPITAL_COMMUNITY): Payer: Self-pay | Admitting: *Deleted

## 2016-05-04 ENCOUNTER — Inpatient Hospital Stay (HOSPITAL_COMMUNITY)
Admission: AD | Admit: 2016-05-04 | Discharge: 2016-05-04 | Disposition: A | Discharge status: Home / Self Care | Payer: Self-pay | Source: Ambulatory Visit | Attending: Obstetrics & Gynecology | Admitting: Obstetrics & Gynecology

## 2016-05-04 DIAGNOSIS — J45909 Unspecified asthma, uncomplicated: Secondary | ICD-10-CM | POA: Insufficient documentation

## 2016-05-04 DIAGNOSIS — F17210 Nicotine dependence, cigarettes, uncomplicated: Secondary | ICD-10-CM | POA: Insufficient documentation

## 2016-05-04 DIAGNOSIS — N939 Abnormal uterine and vaginal bleeding, unspecified: Secondary | ICD-10-CM | POA: Insufficient documentation

## 2016-05-04 DIAGNOSIS — F329 Major depressive disorder, single episode, unspecified: Secondary | ICD-10-CM | POA: Insufficient documentation

## 2016-05-04 DIAGNOSIS — F419 Anxiety disorder, unspecified: Secondary | ICD-10-CM | POA: Insufficient documentation

## 2016-05-04 DIAGNOSIS — N946 Dysmenorrhea, unspecified: Secondary | ICD-10-CM | POA: Insufficient documentation

## 2016-05-04 HISTORY — DX: Unspecified abnormal cytological findings in specimens from vagina: R87.629

## 2016-05-04 HISTORY — DX: Anemia, unspecified: D64.9

## 2016-05-04 HISTORY — DX: Unspecified infectious disease: B99.9

## 2016-05-04 LAB — CBC WITH DIFFERENTIAL/PLATELET
BASOS PCT: 1 %
Basophils Absolute: 0 10*3/uL (ref 0.0–0.1)
EOS ABS: 0.1 10*3/uL (ref 0.0–0.7)
Eosinophils Relative: 1 %
HCT: 40.5 % (ref 36.0–46.0)
Hemoglobin: 13.4 g/dL (ref 12.0–15.0)
Lymphocytes Relative: 46 %
Lymphs Abs: 4.1 10*3/uL — ABNORMAL HIGH (ref 0.7–4.0)
MCH: 29.8 pg (ref 26.0–34.0)
MCHC: 33.1 g/dL (ref 30.0–36.0)
MCV: 90.2 fL (ref 78.0–100.0)
MONO ABS: 0.3 10*3/uL (ref 0.1–1.0)
Monocytes Relative: 4 %
Neutro Abs: 4.3 10*3/uL (ref 1.7–7.7)
Neutrophils Relative %: 48 %
Platelets: 367 10*3/uL (ref 150–400)
RBC: 4.49 MIL/uL (ref 3.87–5.11)
RDW: 14.7 % (ref 11.5–15.5)
WBC: 8.9 10*3/uL (ref 4.0–10.5)

## 2016-05-04 LAB — URINALYSIS, ROUTINE W REFLEX MICROSCOPIC
BILIRUBIN URINE: NEGATIVE
GLUCOSE, UA: NEGATIVE mg/dL
KETONES UR: NEGATIVE mg/dL
LEUKOCYTES UA: NEGATIVE
Nitrite: NEGATIVE
PROTEIN: NEGATIVE mg/dL
Specific Gravity, Urine: 1.01 (ref 1.005–1.030)
pH: 5 (ref 5.0–8.0)

## 2016-05-04 LAB — WET PREP, GENITAL
CLUE CELLS WET PREP: NONE SEEN
SPERM: NONE SEEN
Trich, Wet Prep: NONE SEEN
WBC WET PREP: NONE SEEN
YEAST WET PREP: NONE SEEN

## 2016-05-04 LAB — POCT PREGNANCY, URINE: Preg Test, Ur: NEGATIVE

## 2016-05-04 MED ORDER — HYDROCODONE-ACETAMINOPHEN 5-325 MG PO TABS: 1.0000 | ORAL_TABLET | Freq: Four times a day (QID) | ORAL | 0 refills | Status: DC | PRN

## 2016-05-04 MED ORDER — KETOROLAC TROMETHAMINE 60 MG/2ML IM SOLN
60.0000 mg | Freq: Once | INTRAMUSCULAR | Status: AC
  Administered 2016-05-04: 60 mg via INTRAMUSCULAR
  Filled 2016-05-04: qty 2

## 2016-05-04 NOTE — MAU Note (Signed)
Pt reports urinary frequency since yesterday. Also started having vaginal bleeding yesterday and just had a period in 4/16. States she is having lower abd pain and pelvic pain.

## 2016-05-04 NOTE — MAU Provider Note (Signed)
History     CSN: [id number]  Arrival date and time: 05/04/16 1242   First Provider Initiated Contact with Patient 05/04/16 1340      Chief Complaint  Patient presents with  . Urinary Frequency  . Abdominal Pain   HPI [name] is a 36 y.o. [id number] who presents to MAU today with complaint of urinary frequency and vaginal bleeding. The patient states that she had spotting yesterday and then woke up this morning with blood in her shorts. She said that she had pain after she urinated and thought she might also have a UTI. She states pain is in the lower abdomen bilaterally and cramping. She rates her pain at 8/10 now. She has not taken anything for pain. She states a long history of irregular periods since 1998. She states that she was told it may be due to recurrent ovarian cysts. She had a cystectomy in 2015 for a hemorrhagic cyst. She denies N/V/D or fever.   OB History    Gravida Para Term Preterm AB Living   7 3 2 1 4 3    SAB TAB Ectopic Multiple Live Births   3 1     3       Past Medical History:  Diagnosis Date  . Abnormal Pap smear   . Anemia   . Anxiety   . Asthma   . Depression   . Fibroid   . Infection    UTI  . Ovarian cyst   . 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 Vaginal Pap smear, abnormal     Past Surgical History:  Procedure Laterality Date  . CESAREAN SECTION    . DILATION AND CURETTAGE OF UTERUS    . HERNIA REPAIR    . TUBAL LIGATION      Family History  Problem Relation Age of Onset  . Heart disease Mother     chf  . Hypertension Mother   . Fibromyalgia Mother   . COPD Mother   . Alcohol abuse Father   . Anesthesia problems Neg Hx     Social History  Substance Use Topics  . Smoking status: Current Every Day Smoker    Packs/day: 1.00    Years: 15.00    Types: Cigarettes  . Smokeless tobacco: Never Used  . Alcohol use Yes     Comment: social    Allergies:  No Known  Allergies  Prescriptions Prior to Admission  Medication Sig Dispense Refill Last Dose  . ibuprofen (ADVIL,MOTRIN) 200 MG tablet Take 800 mg by mouth every 6 (six) hours as needed for headache or mild pain.   Past Month at Unknown time    Review of Systems  Constitutional: Negative for fever.  Gastrointestinal: Positive for abdominal pain. Negative for constipation, diarrhea, nausea and vomiting.  Genitourinary: Positive for pelvic pain and vaginal bleeding. Negative for dysuria, frequency, urgency and vaginal pain.   Physical Exam   Blood pressure 129/80, pulse 68, temperature 98.7 F (37.1 C), temperature source Oral, resp. rate 19, height 5\' 6"  (1.676 m), weight 257 lb (116.6 kg), last menstrual period 04/18/2016, SpO2 98 %.  Physical Exam  Nursing note and vitals reviewed. Constitutional: She is oriented to person, place, and time. She appears well-developed and well-nourished. No distress.  HENT:  Head: Normocephalic and atraumatic.  Cardiovascular: Normal rate.   Respiratory: Effort normal.  GI: Soft. Bowel sounds are normal. She exhibits no distension and no mass.  There is tenderness (mild tenderness to palpation of the lower abdomen bilaterally). There is no rebound and no guarding.  Genitourinary: Uterus is tender (mild to moderate). Uterus is not enlarged. Cervix exhibits no motion tenderness, no discharge and no friability. Right adnexum displays no mass and no tenderness. Left adnexum displays no mass and no tenderness. There is bleeding (small) in the vagina. No vaginal discharge found.  Neurological: She is alert and oriented to person, place, and time.  Skin: Skin is warm and dry. No erythema.  Psychiatric: She has a normal mood and affect.    Results for orders placed or performed during the hospital encounter of 05/04/16 (from the past 24 hour(s))  Urinalysis, Routine w reflex microscopic     Status: Abnormal   Collection Time: 05/04/16 12:55 PM  Result Value Ref  Range   Color, Urine STRAW (A) YELLOW   APPearance CLEAR CLEAR   Specific Gravity, Urine 1.010 1.005 - 1.030   pH 5.0 5.0 - 8.0   Glucose, UA NEGATIVE NEGATIVE mg/dL   Hgb urine dipstick LARGE (A) NEGATIVE   Bilirubin Urine NEGATIVE NEGATIVE   Ketones, ur NEGATIVE NEGATIVE mg/dL   Protein, ur NEGATIVE NEGATIVE mg/dL   Nitrite NEGATIVE NEGATIVE   Leukocytes, UA NEGATIVE NEGATIVE   RBC / HPF 6-30 0 - 5 RBC/hpf   WBC, UA 0-5 0 - 5 WBC/hpf   Bacteria, UA RARE (A) NONE SEEN   Squamous Epithelial / LPF 0-5 (A) NONE SEEN  Pregnancy, urine POC     Status: None   Collection Time: 05/04/16  1:01 PM  Result Value Ref Range   Preg Test, Ur NEGATIVE NEGATIVE  Wet prep, genital     Status: None   Collection Time: 05/04/16  2:18 PM  Result Value Ref Range   Yeast Wet Prep HPF POC NONE SEEN NONE SEEN   Trich, Wet Prep NONE SEEN NONE SEEN   Clue Cells Wet Prep HPF POC NONE SEEN NONE SEEN   WBC, Wet Prep HPF POC NONE SEEN NONE SEEN   Sperm NONE SEEN   CBC with Differential/Platelet     Status: Abnormal   Collection Time: 05/04/16  2:20 PM  Result Value Ref Range   WBC 8.9 4.0 - 10.5 K/uL   RBC 4.49 3.87 - 5.11 MIL/uL   Hemoglobin 13.4 12.0 - 15.0 g/dL   HCT 40.5 36.0 - 46.0 %   MCV 90.2 78.0 - 100.0 fL   MCH 29.8 26.0 - 34.0 pg   MCHC 33.1 30.0 - 36.0 g/dL   RDW 14.7 11.5 - 15.5 %   Platelets 367 150 - 400 K/uL   Neutrophils Relative % 48 %   Neutro Abs 4.3 1.7 - 7.7 K/uL   Lymphocytes Relative 46 %   Lymphs Abs 4.1 (H) 0.7 - 4.0 K/uL   Monocytes Relative 4 %   Monocytes Absolute 0.3 0.1 - 1.0 K/uL   Eosinophils Relative 1 %   Eosinophils Absolute 0.1 0.0 - 0.7 K/uL   Basophils Relative 1 %   Basophils Absolute 0.0 0.0 - 0.1 K/uL    MAU Course  Procedures None  MDM UPT - negative UA today without evidence of UTI CBC, Wet prep, GC/Chlamydia, HIV and RPR today  60 mg Toradol given. Patient reports some improvement in pain.  Patient states "do not give me 800 mg Ibuprofen, it  doesn't work."  Patient is hemodynamically stable. Complete work-up for AUB as outpatient.  Assessment and Plan  A: Abnormal uterine bleeding  Dysmenorrhea   P:  Discharge home Rx for Vicodin given to patient Bleeding precautions discussed Outpatient [place] scheduled for 05/11/16 at 1:00 pm Patient advised to follow-up with [facility] in 2-3 weeks for evaluation and management of irregular bleeding. They will call the patient with an appointment.  Patient may return to MAU as needed or if her condition were to change or worsen  Luvenia Redden, PA-C  05/04/2016, 3:30 PM

## 2016-05-04 NOTE — Discharge Instructions (Signed)
Abnormal Uterine Bleeding Abnormal uterine bleeding means bleeding more than usual from your uterus. It can include:  Bleeding between periods.  Bleeding after sex.  Bleeding that is heavier than normal.  Periods that last longer than usual.  Bleeding after you have stopped having your period (menopause). There are many problems that may cause this. You should see a doctor for any kind of bleeding that is not normal. Treatment depends on the cause of the bleeding. Follow these instructions at home:  Watch your condition for any changes.  Do not use tampons, douche, or have sex, if your doctor tells you not to.  Change your pads often.  Get regular well-woman exams. Make sure they include a pelvic exam and cervical cancer screening.  Keep all follow-up visits as told by your doctor. This is important. Contact a doctor if:  The bleeding lasts more than one week.  You feel dizzy at times.  You feel like you are going to throw up (nauseous).  You throw up. Get help right away if:  You pass out.  You have to change pads every hour.  You have belly (abdominal) pain.  You have a fever.  You get sweaty.  You get weak.  You passing large blood clots from your vagina. Summary  Abnormal uterine bleeding means bleeding more than usual from your uterus.  There are many problems that may cause this. You should see a doctor for any kind of bleeding that is not normal.  Treatment depends on the cause of the bleeding. This information is not intended to replace advice given to you by your health care provider. Make sure you discuss any questions you have with your health care provider. Document Released: 10/17/2008 Document Revised: 12/15/2015 Document Reviewed: 12/15/2015 Elsevier Interactive Patient Education  2017 [employer]  Dysmenorrhea Dysmenorrhea means painful cramps during your period (menstrual period). You will have pain in your lower belly (abdomen). The  pain is caused by the tightening (contracting) of the muscles of the womb (uterus). The pain may be mild or very bad. With this condition, you may:  Have a headache.  Feel sick to your stomach (nauseous).  Throw up (vomit).  Have lower back pain. Follow these instructions at home: Helping pain and cramping   Put heat on your lower back or belly when you have pain or cramps. Use the heat source that your doctor tells you to use.  Place a towel between your skin and the heat.  Leave the heat on for 20-30 minutes.  Remove the heat if your skin turns bright red. This is especially important if you cannot feel pain, heat, or cold.  Do not have a heating pad on during sleep.  Do aerobic exercises. These include walking, swimming, or biking. These may help with cramps.  Massage your lower back or belly. This may help lessen pain. General instructions   Take over-the-counter and prescription medicines only as told by your doctor.  Do not drive or use heavy machinery while taking prescription pain medicine.  Avoid alcohol and caffeine during and right before your period. These can make cramps worse.  Do not use any products that have nicotine or tobacco. These include cigarettes and e-cigarettes. If you need help quitting, ask your doctor.  Keep all follow-up visits as told by your doctor. This is important. Contact a doctor if:  You have pain that gets worse.  You have pain that does not get better with medicine.  You have pain during  sex.  You feel sick to your stomach or you throw up during your period, and medicine does not help. Get help right away if:  You pass out (faint). Summary  Dysmenorrhea means painful cramps during your period (menstrual period).  Put heat on your lower back or belly when you have pain or cramps.  Do exercises like walking, swimming, or biking to help with cramps.  Contact a doctor if you have pain during sex. This information is not  intended to replace advice given to you by your health care provider. Make sure you discuss any questions you have with your health care provider. Document Released: 03/18/2008 Document Revised: 01/07/2016 Document Reviewed: 01/07/2016 Elsevier Interactive Patient Education  2017 [employer].

## 2016-05-04 NOTE — MAU Note (Addendum)
Pain in and lower abd and vagina, feels like it is going to fall out.  Started yesterday, comes and goes.  Pressure and spasm at the end of urination.  Having frequency and urgency.  Bleeding started during the night, was spotting yesterday.

## 2016-05-05 LAB — HIV ANTIBODY (ROUTINE TESTING W REFLEX): HIV SCREEN 4TH GENERATION: NONREACTIVE

## 2016-05-05 LAB — RPR: RPR: NONREACTIVE

## 2016-05-06 LAB — GC/CHLAMYDIA PROBE AMP (~~LOC~~) NOT AT ARMC
Chlamydia: NEGATIVE
NEISSERIA GONORRHEA: NEGATIVE

## 2016-05-11 ENCOUNTER — Ambulatory Visit (HOSPITAL_COMMUNITY): Payer: Self-pay | Attending: Medical

## 2016-05-25 ENCOUNTER — Encounter: Payer: Self-pay | Admitting: Obstetrics and Gynecology

## 2016-05-25 ENCOUNTER — Ambulatory Visit: Payer: Medicaid Other | Admitting: Obstetrics and Gynecology

## 2016-05-25 NOTE — Progress Notes (Signed)
Patient did not keep GYN appointment for 05/25/2016.  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

## 2016-06-03 ENCOUNTER — Emergency Department (HOSPITAL_COMMUNITY): Payer: Self-pay

## 2016-06-03 ENCOUNTER — Encounter (HOSPITAL_COMMUNITY): Payer: Self-pay

## 2016-06-03 ENCOUNTER — Emergency Department (HOSPITAL_COMMUNITY)
Admission: EM | Admit: 2016-06-03 | Discharge: 2016-06-03 | Discharge status: Against Medical Advice | Payer: Self-pay | Attending: Emergency Medicine | Admitting: Emergency Medicine

## 2016-06-03 DIAGNOSIS — R079 Chest pain, unspecified: Secondary | ICD-10-CM | POA: Insufficient documentation

## 2016-06-03 LAB — BASIC METABOLIC PANEL
ANION GAP: 10 (ref 5–15)
BUN: 7 mg/dL (ref 6–20)
CALCIUM: 9.3 mg/dL (ref 8.9–10.3)
CHLORIDE: 103 mmol/L (ref 101–111)
CO2: 23 mmol/L (ref 22–32)
Creatinine, Ser: 0.78 mg/dL (ref 0.44–1.00)
GFR calc non Af Amer: >60 mL/min (ref >60)
Glucose, Bld: 87 mg/dL (ref 65–99)
Potassium: 3.9 mmol/L (ref 3.5–5.1)
Sodium: 136 mmol/L (ref 135–145)

## 2016-06-03 LAB — CBC
HCT: 42.7 % (ref 36.0–46.0)
HEMOGLOBIN: 14.1 g/dL (ref 12.0–15.0)
MCH: 29 pg (ref 26.0–34.0)
MCHC: 33 g/dL (ref 30.0–36.0)
MCV: 87.9 fL (ref 78.0–100.0)
Platelets: 377 10*3/uL (ref 150–400)
RBC: 4.86 MIL/uL (ref 3.87–5.11)
RDW: 14 % (ref 11.5–15.5)
WBC: 11.4 10*3/uL — AB (ref 4.0–10.5)

## 2016-06-03 LAB — I-STAT TROPONIN, ED: TROPONIN I, POC: 0 ng/mL (ref 0.00–0.08)

## 2016-06-03 NOTE — ED Notes (Signed)
Unable to locate pt in waiting room.

## 2016-06-03 NOTE — ED Triage Notes (Signed)
Pt complaining of L sided chest pain. Pt states fell on L side yesterday, some generalized soreness. Pt states pain onset x 3 hrs ago. Pt a/o x 4 VSS.

## 2016-07-12 ENCOUNTER — Emergency Department (HOSPITAL_COMMUNITY)
Admission: EM | Admit: 2016-07-12 | Discharge: 2016-07-12 | Disposition: A | Discharge status: Home / Self Care | Payer: Self-pay | Attending: Emergency Medicine | Admitting: Emergency Medicine

## 2016-07-12 ENCOUNTER — Emergency Department (HOSPITAL_COMMUNITY): Payer: Self-pay

## 2016-07-12 ENCOUNTER — Encounter (HOSPITAL_COMMUNITY): Payer: Self-pay | Admitting: Emergency Medicine

## 2016-07-12 DIAGNOSIS — F1410 Cocaine abuse, uncomplicated: Secondary | ICD-10-CM | POA: Insufficient documentation

## 2016-07-12 DIAGNOSIS — F17210 Nicotine dependence, cigarettes, uncomplicated: Secondary | ICD-10-CM | POA: Insufficient documentation

## 2016-07-12 DIAGNOSIS — J45909 Unspecified asthma, uncomplicated: Secondary | ICD-10-CM | POA: Insufficient documentation

## 2016-07-12 DIAGNOSIS — F419 Anxiety disorder, unspecified: Secondary | ICD-10-CM | POA: Insufficient documentation

## 2016-07-12 DIAGNOSIS — F329 Major depressive disorder, single episode, unspecified: Secondary | ICD-10-CM | POA: Insufficient documentation

## 2016-07-12 DIAGNOSIS — R0789 Other chest pain: Secondary | ICD-10-CM | POA: Insufficient documentation

## 2016-07-12 LAB — CBC
HCT: 39.5 % (ref 36.0–46.0)
Hemoglobin: 12.8 g/dL (ref 12.0–15.0)
MCH: 28.8 pg (ref 26.0–34.0)
MCHC: 32.4 g/dL (ref 30.0–36.0)
MCV: 88.8 fL (ref 78.0–100.0)
PLATELETS: 344 10*3/uL (ref 150–400)
RBC: 4.45 MIL/uL (ref 3.87–5.11)
RDW: 14.1 % (ref 11.5–15.5)
WBC: 12.5 10*3/uL — AB (ref 4.0–10.5)

## 2016-07-12 LAB — RAPID URINE DRUG SCREEN, HOSP PERFORMED
AMPHETAMINES: NOT DETECTED
Barbiturates: NOT DETECTED
Benzodiazepines: NOT DETECTED
Cocaine: POSITIVE — AB
Opiates: NOT DETECTED
Tetrahydrocannabinol: POSITIVE — AB

## 2016-07-12 LAB — BASIC METABOLIC PANEL
Anion gap: 7 (ref 5–15)
BUN: 9 mg/dL (ref 6–20)
CALCIUM: 9.1 mg/dL (ref 8.9–10.3)
CO2: 24 mmol/L (ref 22–32)
Chloride: 105 mmol/L (ref 101–111)
Creatinine, Ser: 0.8 mg/dL (ref 0.44–1.00)
GFR calc non Af Amer: >60 mL/min (ref >60)
Glucose, Bld: 84 mg/dL (ref 65–99)
Potassium: 4 mmol/L (ref 3.5–5.1)
SODIUM: 136 mmol/L (ref 135–145)

## 2016-07-12 LAB — I-STAT TROPONIN, ED
TROPONIN I, POC: 0 ng/mL (ref 0.00–0.08)
Troponin i, poc: 0 ng/mL (ref 0.00–0.08)

## 2016-07-12 MED ORDER — NITROGLYCERIN 0.4 MG SL SUBL
0.4000 mg | SUBLINGUAL_TABLET | SUBLINGUAL | Status: DC | PRN
Start: 2016-07-12 — End: 2016-07-13
  Administered 2016-07-12: 0.4 mg via SUBLINGUAL
  Filled 2016-07-12: qty 1

## 2016-07-12 MED ORDER — BACLOFEN 10 MG PO TABS
10.0000 mg | ORAL_TABLET | Freq: Three times a day (TID) | ORAL | Status: DC
  Administered 2016-07-12: 10 mg via ORAL
  Filled 2016-07-12: qty 1

## 2016-07-12 MED ORDER — ASPIRIN 81 MG PO CHEW
324.0000 mg | CHEWABLE_TABLET | Freq: Once | ORAL | Status: AC
  Administered 2016-07-12: 324 mg via ORAL
  Filled 2016-07-12: qty 4

## 2016-07-12 NOTE — ED Notes (Signed)
Patient transported to X-ray 

## 2016-07-12 NOTE — ED Provider Notes (Signed)
[facility]PT [facility] Provider Note   CSN: [id number] Arrival date & time: 07/12/16  1920     History   Chief Complaint Chief Complaint  Patient presents with  . Chest Pain    HPI Victoria Clayton is a 36 y.o. female who presents to the ED with cc of Chest wall spasms. She has had these in the past and feels they are muscle spasms. She denies any  Diaphoresis, nausea, shortness of breath. The pain comes and goes. She states she can reproduce it by palpating her chest. She states that in the past has been relieved with muscle relaxers and Motrin. The patient denies cocaine use.  HPI  Past Medical History:  Diagnosis Date  . Abnormal Pap smear   . Anemia   . Anxiety   . Asthma   . Depression   . Fibroid   . Infection    UTI  . Ovarian cyst   . 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 Vaginal Pap smear, abnormal     Patient Active Problem List   Diagnosis Date Noted  . Cocaine use 12/21/2014  . Left inguinal hernia 01/28/2013    Past Surgical History:  Procedure Laterality Date  . CESAREAN SECTION    . DILATION AND CURETTAGE OF UTERUS    . HERNIA REPAIR    . TUBAL LIGATION      OB History    Gravida Para Term Preterm AB Living   7 3 2 1 4 3    SAB TAB Ectopic Multiple Live Births   3 1     3        Home Medications    Prior to Admission medications   Medication Sig Start Date End Date Taking? Authorizing Provider  HYDROcodone-acetaminophen (NORCO/VICODIN) 5-325 MG tablet Take 1 tablet by mouth every 6 (six) hours as needed for moderate pain. 05/04/16   Luvenia Redden, PA-C  ibuprofen (ADVIL,MOTRIN) 200 MG tablet Take 800 mg by mouth every 6 (six) hours as needed for headache or mild pain.    [provider]    Family History Family History  Problem Relation Age of Onset  . Heart disease Mother        chf  . Hypertension Mother   . Fibromyalgia Mother   . COPD Mother   . Alcohol abuse  Father   . Anesthesia problems Neg Hx     Social History Social History  Substance Use Topics  . Smoking status: Current Every Day Smoker    Packs/day: 1.00    Years: 15.00    Types: Cigarettes  . Smokeless tobacco: Never Used  . Alcohol use Yes     Comment: social     Allergies   Patient has no known allergies.   Review of Systems Review of Systems Ten systems reviewed and are negative for acute change, except as noted in the HPI.    Physical Exam Updated Vital Signs BP (!) 158/97 (BP Location: Right Arm)   Pulse 71   Temp 98.3 F (36.8 C) (Oral)   Resp 20   Ht 5\' 7"  (1.702 m)   Wt 120.2 kg (265 lb)   LMP 06/27/2016 (Approximate)   SpO2 97%   BMI 41.50 kg/m   Physical Exam  Physical Exam  Nursing note and vitals reviewed. Constitutional: She is oriented to person, place, and time. She appears well-developed and well-nourished. No distress.  HENT:  Head: Normocephalic and  atraumatic.  Eyes: Conjunctivae normal and EOM are normal. Pupils are equal, round, and reactive to light. No scleral icterus.  Neck: Normal range of motion.  Cardiovascular: Normal rate, regular rhythm and normal heart sounds.  Exam reveals no gallop and no friction rub.   No murmur heard. Pulmonary/Chest: Effort normal and breath sounds normal. No respiratory distress.   Chest wall is tender to palpation with palpable spasm and reproduces patient's pain complaint Abdominal: Soft. Bowel sounds are normal. She exhibits no distension and no mass. There is no tenderness. There is no guarding.  Neurological: She is alert and oriented to person, place, and time.  Skin: Skin is warm and dry. She is not diaphoretic.    ED Treatments / Results  Labs (all labs ordered are listed, but only abnormal results are displayed) Labs Reviewed  CBC - Abnormal; Notable for the following:       Result Value   WBC 12.5 (*)    All other components within normal limits  RAPID URINE DRUG SCREEN, HOSP  PERFORMED - Abnormal; Notable for the following:    Cocaine POSITIVE (*)    Tetrahydrocannabinol POSITIVE (*)    All other components within normal limits  BASIC METABOLIC PANEL  I-STAT TROPOININ, ED  I-STAT TROPOININ, ED  I-STAT TROPOININ, ED    EKG  EKG Interpretation  Date/Time:  Tuesday July 12 2016 20:46:05 EDT Ventricular Rate:  70 PR Interval:  162 QRS Duration: 76 QT Interval:  422 QTC Calculation: 455 R Axis:   27 Text Interpretation:  Normal sinus rhythm Normal ECG Confirmed by Davonna Belling [phone]) on 07/13/2016 6:10:08 PM       Radiology No results found.  Procedures Procedures (including critical care time)  Medications Ordered in ED Medications  aspirin chewable tablet 324 mg (324 mg Oral Given 07/12/16 2041)     Initial Impression / Assessment and Plan / ED Course  I have reviewed the triage vital signs and the nursing notes.  Pertinent labs & imaging results that were available during my care of the patient were reviewed by me and considered in my medical decision making (see chart for details).     . Patient with chest pain. She is cocaine positive.  Patient has had 2 troponins here, which are negative. Her pain is resolved. I do feel this is chest wall spasm. I discussed the case with Dr. Billy Fischer who agrees with the workup and plan of care. The patient is advised to discontinue cocaine abuse. She was safe for discharge to follow up with her PCP.  Final Clinical Impressions(s) / ED Diagnoses   Final diagnoses:  Chest wall pain    New Prescriptions Discharge Medication List as of 07/12/2016 11:30 PM       Margarita Mail, PA-C 07/16/16 [id number]    Gareth Morgan, MD 07/17/16 2043

## 2016-07-12 NOTE — Discharge Instructions (Signed)
You have been diagnosed by your caregiver as having chest wall pain. °SEEK IMMEDIATE MEDICAL ATTENTION IF: °You develop a fever.  °Your chest pains become severe or intolerable.  °You develop new, unexplained symptoms (problems).  °You develop shortness of breath, nausea, vomiting, sweating or feel light headed.  °You develop a new cough or you cough up blood. ° °

## 2016-07-12 NOTE — ED Triage Notes (Signed)
BIB EMS from home, pt called out for "chest cramping" rating 9/10, pt admits to drinking 5hr energy rigth before episode started. Hypertensive en route.

## 2017-08-11 ENCOUNTER — Emergency Department (HOSPITAL_COMMUNITY)
Admission: EM | Admit: 2017-08-11 | Discharge: 2017-08-11 | Disposition: A | Discharge status: Home / Self Care | Payer: Self-pay | Attending: Emergency Medicine | Admitting: Emergency Medicine

## 2017-08-11 ENCOUNTER — Encounter (HOSPITAL_COMMUNITY): Payer: Self-pay | Admitting: Emergency Medicine

## 2017-08-11 ENCOUNTER — Other Ambulatory Visit: Payer: Self-pay

## 2017-08-11 DIAGNOSIS — M5441 Lumbago with sciatica, right side: Secondary | ICD-10-CM | POA: Insufficient documentation

## 2017-08-11 DIAGNOSIS — F329 Major depressive disorder, single episode, unspecified: Secondary | ICD-10-CM | POA: Insufficient documentation

## 2017-08-11 DIAGNOSIS — F419 Anxiety disorder, unspecified: Secondary | ICD-10-CM | POA: Insufficient documentation

## 2017-08-11 DIAGNOSIS — F17210 Nicotine dependence, cigarettes, uncomplicated: Secondary | ICD-10-CM | POA: Insufficient documentation

## 2017-08-11 DIAGNOSIS — F1490 Cocaine use, unspecified, uncomplicated: Secondary | ICD-10-CM | POA: Insufficient documentation

## 2017-08-11 DIAGNOSIS — M5442 Lumbago with sciatica, left side: Secondary | ICD-10-CM | POA: Insufficient documentation

## 2017-08-11 DIAGNOSIS — J45909 Unspecified asthma, uncomplicated: Secondary | ICD-10-CM | POA: Insufficient documentation

## 2017-08-11 LAB — POC URINE PREG, ED: Preg Test, Ur: NEGATIVE

## 2017-08-11 MED ORDER — METHOCARBAMOL 500 MG PO TABS: 500.0000 mg | ORAL_TABLET | Freq: Two times a day (BID) | ORAL | 0 refills | Status: DC

## 2017-08-11 MED ORDER — METHOCARBAMOL 500 MG PO TABS
500.0000 mg | ORAL_TABLET | Freq: Once | ORAL | Status: AC
  Administered 2017-08-11: 500 mg via ORAL
  Filled 2017-08-11: qty 1

## 2017-08-11 MED ORDER — DEXAMETHASONE SODIUM PHOSPHATE 10 MG/ML IJ SOLN
10.0000 mg | Freq: Once | INTRAMUSCULAR | Status: AC
Start: 2017-08-11 — End: 2017-08-11
  Administered 2017-08-11: 10 mg via INTRAMUSCULAR
  Filled 2017-08-11: qty 1

## 2017-08-11 MED ORDER — TRAMADOL HCL 50 MG PO TABS: 50.0000 mg | ORAL_TABLET | Freq: Four times a day (QID) | ORAL | 0 refills | Status: DC | PRN

## 2017-08-11 MED ORDER — KETOROLAC TROMETHAMINE 15 MG/ML IJ SOLN
15.0000 mg | Freq: Once | INTRAMUSCULAR | Status: AC
  Administered 2017-08-11: 15 mg via INTRAMUSCULAR
  Filled 2017-08-11: qty 1

## 2017-08-11 NOTE — ED Triage Notes (Signed)
Pt st's she started having low back pain last pm after standing on her feet all day at work.  Pt st's this am when she got up pain was worse and radiating into both legs

## 2017-08-11 NOTE — Discharge Instructions (Signed)
Continue Ibuprofen for pain and inflammation Take Tramadol for moderate-severe pain Take muscle relaxer at bedtime to help you sleep. This medicine makes you drowsy so do not take before driving or work Use a heating pad for sore muscles - use for 20 minutes several times a day Return for worsening symptoms

## 2017-08-11 NOTE — ED Provider Notes (Signed)
[facility] EMERGENCY DEPARTMENT Provider Note   CSN: [id number] Arrival date & time: 08/11/17  1802     History   Chief Complaint Chief Complaint  Patient presents with  . Back Pain    HPI Victoria Clayton is a 37 y.o. female who presents with low back pain and bilateral leg pain.  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  The patient states that she has had intermittent low back pain for years.  She was diagnosed with a bone cyst in 2012 and reports that she was going to have surgery on it however the surgeon canceled last minute because they felt that it would be too risky.  She states that her back pain is aggravated by long periods of standing or lifting.  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  She typically takes over-the-counter pain medicines and rest and will get better.  She has been working at [employer] and yesterday she had a particularly busy day where she stood for the whole day.  When she went home she had some soreness in her thighs.  She went to sleep and when she woke up she felt like she could not get up or walk.  She states the pain is severe and over the lower back and radiates down both of her legs but is worse in the right.  The pain radiates all the way down to her feet she reports associated numbness of the great toe of the right foot. No fever, syncope, trauma, unexplained weight loss, hx of cancer, loss of bowel/bladder function, saddle anesthesia, urinary retention, IVDU.  HPI  Past Medical History:  Diagnosis Date  . Abnormal Pap smear   . Anemia   . Anxiety   . Asthma   . Depression   . Fibroid   . Infection    UTI  . Ovarian cyst   . 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 Vaginal Pap smear, abnormal     Patient Active Problem List   Diagnosis Date Noted  . Cocaine use  12/21/2014  . Left inguinal hernia 01/28/2013    Past Surgical History:  Procedure Laterality Date  . CESAREAN SECTION    . DILATION AND CURETTAGE OF UTERUS    . HERNIA REPAIR    . TUBAL LIGATION       OB History    Gravida  7   Para  3   Term  2   Preterm  1   AB  4   Living  3     SAB  3   TAB  1   Ectopic      Multiple      Live Births  3            Home Medications    Prior to Admission medications   Medication Sig Start Date End Date Taking? Authorizing Provider  HYDROcodone-acetaminophen (NORCO/VICODIN) 5-325 MG tablet Take 1 tablet by mouth every 6 (six) hours as needed for moderate pain. 05/04/16   Luvenia Redden, PA-C  ibuprofen (ADVIL,MOTRIN) 200 MG tablet Take 800 mg by mouth every 6 (six) hours as needed for headache or mild pain.    [provider]    Family History Family History  Problem Relation Age of Onset  . Heart disease Mother  chf  . Hypertension Mother   . Fibromyalgia Mother   . COPD Mother   . Alcohol abuse Father   . Anesthesia problems Neg Hx     Social History Social History   Tobacco Use  . Smoking status: Current Every Day Smoker    Packs/day: 1.00    Years: 15.00    Pack years: 15.00    Types: Cigarettes  . Smokeless tobacco: Never Used  Substance Use Topics  . Alcohol use: Yes    Comment: social  . Drug use: Yes    Frequency: 3.0 times per week    Types: Marijuana     Allergies   Patient has no known allergies.   Review of Systems Review of Systems  Constitutional: Negative for fever.  Musculoskeletal: Positive for back pain.  Neurological: Positive for numbness. Negative for weakness.     Physical Exam Updated Vital Signs BP (!) 151/78 (BP Location: Right Arm)   Pulse 73   Temp 98.4 F (36.9 C) (Oral)   Resp 20   Ht 5\' 7"  (1.702 m)   Wt 119.7 kg   LMP 07/10/2017 (Exact Date)   SpO2 100%   BMI 41.35 kg/m   Physical Exam  Constitutional: She is oriented to person,  place, and time. She appears well-developed and well-nourished. No distress.  HENT:  Head: Normocephalic and atraumatic.  Eyes: Pupils are equal, round, and reactive to light. Conjunctivae are normal. Right eye exhibits no discharge. Left eye exhibits no discharge. No scleral icterus.  Neck: Normal range of motion.  Cardiovascular: Normal rate.  Pulmonary/Chest: Effort normal. No respiratory distress.  Abdominal: She exhibits no distension.  Musculoskeletal:  Back: Inspection: No masses, deformity, or rash Palpation: Midline tenderness over lumbar spine and sacrum. Left paraspinal muscle tenderness. Strength: 5/5 in lower extremities and normal plantar and dorsiflexion Sensation: Intact sensation with light touch in lower extremities bilaterally Reflexes: Patellar reflex is 1+ bilaterally SLR: Positive SLR bilaterally Gait: Antalgic gait   Neurological: She is alert and oriented to person, place, and time.  Skin: Skin is warm and dry.  Psychiatric: She has a normal mood and affect. Her behavior is normal.  Nursing note and vitals reviewed.    ED Treatments / Results  Labs (all labs ordered are listed, but only abnormal results are displayed) Labs Reviewed  POC URINE PREG, ED    EKG None  Radiology No results found.  Procedures Procedures (including critical care time)  Medications Ordered in ED Medications  ketorolac (TORADOL) 15 MG/ML injection 15 mg (15 mg Intramuscular Given 08/11/17 2001)  methocarbamol (ROBAXIN) tablet 500 mg (500 mg Oral Given 08/11/17 2001)  dexamethasone (DECADRON) injection 10 mg (10 mg Intramuscular Given 08/11/17 2001)     Initial Impression / Assessment and Plan / ED Course  I have reviewed the triage vital signs and the nursing notes.  Pertinent labs & imaging results that were available during my care of the patient were reviewed by me and considered in my medical decision making (see chart for details).  37 year old female presents with  bilateral leg pain, difficulty walking, low back pain.  She states that this is a an acute on chronic issue and believes it is due to standing on her feet all day.  She does have a history of an aneurysmal bone cyst from years ago.  Is unclear if this is related to her pain or not.  Her exam is consistent with sciatica.  She is ambulatory.  She has normal strength  and reflexes.  I think her inability to walk is due to her pain.  She was given Toradol, Robaxin, Decadron.  I ordered x-rays of the lumbar and sacral spine however was notified by nursing after she waited approximately an hour that she did not want to wait anymore for this and would prefer to go home because the chair is making her hurt more.  She was given a prescription for muscle relaxer and tramadol and advised to follow-up with her doctor if her symptoms are persistent.  She was given a work note.  Final Clinical Impressions(s) / ED Diagnoses   Final diagnoses:  Acute bilateral low back pain with bilateral sciatica    ED Discharge Orders    None       Recardo Evangelist, PA-C 08/11/17 2203    Merrily Pew, MD 08/12/17 2021

## 2017-09-11 ENCOUNTER — Encounter (HOSPITAL_COMMUNITY): Payer: Self-pay | Admitting: Emergency Medicine

## 2017-09-11 ENCOUNTER — Ambulatory Visit (HOSPITAL_COMMUNITY)
Admission: EM | Admit: 2017-09-11 | Discharge: 2017-09-11 | Disposition: A | Discharge status: Home / Self Care | Payer: Self-pay | Attending: Family Medicine | Admitting: Family Medicine

## 2017-09-11 DIAGNOSIS — R509 Fever, unspecified: Secondary | ICD-10-CM

## 2017-09-11 DIAGNOSIS — Z791 Long term (current) use of non-steroidal anti-inflammatories (NSAID): Secondary | ICD-10-CM | POA: Insufficient documentation

## 2017-09-11 DIAGNOSIS — J039 Acute tonsillitis, unspecified: Secondary | ICD-10-CM | POA: Insufficient documentation

## 2017-09-11 DIAGNOSIS — H9203 Otalgia, bilateral: Secondary | ICD-10-CM | POA: Insufficient documentation

## 2017-09-11 DIAGNOSIS — F17210 Nicotine dependence, cigarettes, uncomplicated: Secondary | ICD-10-CM | POA: Insufficient documentation

## 2017-09-11 DIAGNOSIS — J029 Acute pharyngitis, unspecified: Secondary | ICD-10-CM

## 2017-09-11 LAB — POCT RAPID STREP A: Streptococcus, Group A Screen (Direct): NEGATIVE

## 2017-09-11 MED ORDER — KETOROLAC TROMETHAMINE 60 MG/2ML IM SOLN
60.0000 mg | Freq: Once | INTRAMUSCULAR | Status: AC
  Administered 2017-09-11: 60 mg via INTRAMUSCULAR

## 2017-09-11 MED ORDER — DEXAMETHASONE SODIUM PHOSPHATE 10 MG/ML IJ SOLN
INTRAMUSCULAR | Status: AC
  Filled 2017-09-11: qty 1

## 2017-09-11 MED ORDER — LIDOCAINE HCL (PF) 1 % IJ SOLN
INTRAMUSCULAR | Status: AC
  Filled 2017-09-11: qty 2

## 2017-09-11 MED ORDER — KETOROLAC TROMETHAMINE 60 MG/2ML IM SOLN
INTRAMUSCULAR | Status: AC
  Filled 2017-09-11: qty 2

## 2017-09-11 MED ORDER — PENICILLIN V POTASSIUM 500 MG PO TABS: 500.0000 mg | ORAL_TABLET | Freq: Two times a day (BID) | ORAL | 0 refills | Status: AC

## 2017-09-11 MED ORDER — DEXAMETHASONE SODIUM PHOSPHATE 10 MG/ML IJ SOLN
10.0000 mg | Freq: Once | INTRAMUSCULAR | Status: AC
  Administered 2017-09-11: 10 mg via INTRAMUSCULAR

## 2017-09-11 MED ORDER — CEFTRIAXONE SODIUM 250 MG IJ SOLR
INTRAMUSCULAR | Status: AC
  Filled 2017-09-11: qty 250

## 2017-09-11 MED ORDER — CEFTRIAXONE SODIUM 250 MG IJ SOLR
250.0000 mg | Freq: Once | INTRAMUSCULAR | Status: AC
  Administered 2017-09-11: 250 mg via INTRAMUSCULAR

## 2017-09-11 MED ORDER — LIDOCAINE VISCOUS HCL 2 % MT SOLN: OROMUCOSAL | 0 refills | Status: DC

## 2017-09-11 NOTE — Discharge Instructions (Signed)
Take antibiotic twice a day for 10 days We have sent a culture on your throat swab.  We will call you if it is positive Take Tylenol or ibuprofen for pain I have given you a lidocaine mouthwash for throat pain Drink plenty of liquids Return if you are unable to keep down your medicine, or fluids

## 2017-09-11 NOTE — ED Provider Notes (Signed)
[facility]    CSN: [id number] Arrival date & time: 09/11/17  1007     History   Chief Complaint Chief Complaint  Patient presents with  . Otalgia    HPI Victoria Clayton is a 37 y.o. female.   HPI Patient here with sore throat and fever.  She has "severe" and ear pain.  She is tearful and very demonstrative but the severity of her illness.  She states she cannot swallow.  She is talking normally.  She is swallowing her own saliva well.  She has taken some Tylenol and ibuprofen.  She states that her son was treated for strep throat last week.  No coughing or chest congestion.  No runny or stuffy nose.  No nausea or vomiting.  Denies headache. Past Medical History:  Diagnosis Date  . Abnormal Pap smear   . Anemia   . Anxiety   . Asthma   . Depression   . Fibroid   . Infection    UTI  . Ovarian cyst   . 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 Vaginal Pap smear, abnormal     Patient Active Problem List   Diagnosis Date Noted  . Cocaine use 12/21/2014  . Left inguinal hernia 01/28/2013    Past Surgical History:  Procedure Laterality Date  . CESAREAN SECTION    . DILATION AND CURETTAGE OF UTERUS    . HERNIA REPAIR    . TUBAL LIGATION      OB History    Gravida  7   Para  3   Term  2   Preterm  1   AB  4   Living  3     SAB  3   TAB  1   Ectopic      Multiple      Live Births  3            Home Medications    Prior to Admission medications   Medication Sig Start Date End Date Taking? Authorizing Provider  ibuprofen (ADVIL,MOTRIN) 200 MG tablet Take 800 mg by mouth every 6 (six) hours as needed for headache or mild pain.    [provider]  lidocaine (XYLOCAINE) 2 % solution Use 1 tablespoon as directed, gargle and spit, for throat pain 09/11/17   Raylene Everts, MD  penicillin v potassium (VEETID) 500 MG tablet Take 1 tablet (500 mg total) by mouth 2 (two) times  daily for 10 days. 09/11/17 09/21/17  Raylene Everts, MD    Family History Family History  Problem Relation Age of Onset  . Heart disease Mother        chf  . Hypertension Mother   . Fibromyalgia Mother   . COPD Mother   . Alcohol abuse Father   . Anesthesia problems Neg Hx     Social History Social History   Tobacco Use  . Smoking status: Current Every Day Smoker    Packs/day: 1.00    Years: 15.00    Pack years: 15.00    Types: Cigarettes  . Smokeless tobacco: Never Used  Substance Use Topics  . Alcohol use: Yes    Comment: social  . Drug use: Yes    Frequency: 3.0 times per week    Types: Marijuana     Allergies   Patient has no known allergies.   Review of Systems Review of Systems  Constitutional: Positive for appetite  change, fatigue and fever. Negative for chills.  HENT: Positive for ear pain and sore throat.   Eyes: Negative for pain and visual disturbance.  Respiratory: Negative for cough and shortness of breath.   Cardiovascular: Negative for chest pain and palpitations.  Gastrointestinal: Negative for abdominal pain and vomiting.  Genitourinary: Negative for dysuria and hematuria.  Musculoskeletal: Negative for arthralgias and back pain.  Skin: Negative for color change and rash.  Neurological: Negative for seizures and syncope.  All other systems reviewed and are negative.    Physical Exam Triage Vital Signs ED Triage Vitals  Enc Vitals Group     BP 09/11/17 1058 (!) 156/103     Pulse Rate 09/11/17 1057 93     Resp 09/11/17 1057 18     Temp 09/11/17 1057 98.6 F (37 C)     Temp src --      SpO2 09/11/17 1057 100 %   No data found.  Updated Vital Signs BP (!) 156/103   Pulse 93   Temp 98.6 F (37 C)   Resp 18   SpO2 100%       Physical Exam  Constitutional: She appears well-developed and well-nourished. She appears distressed.  Tearful  HENT:  Head: Normocephalic and atraumatic.  Right Ear: External ear normal.  Left Ear:  External ear normal.  Mouth/Throat: Oropharynx is clear and moist.  TMs and canals are normal.  Posterior pharynx quite injected with enlarged tonsils.  No exudate.  Uvula swollen.  Eyes: Pupils are equal, round, and reactive to light. Conjunctivae and EOM are normal.  Neck: Normal range of motion. Neck supple.  Cardiovascular: Normal rate, regular rhythm and normal heart sounds.  Pulmonary/Chest: Effort normal and breath sounds normal. No respiratory distress.  Abdominal: Soft. She exhibits no distension. There is no tenderness.  Musculoskeletal: Normal range of motion. She exhibits no edema.  Lymphadenopathy:    She has cervical adenopathy.  Neurological: She is alert.  Skin: Skin is warm and dry.  Psychiatric:  Labile at first.  Better after Toradol     UC Treatments / Results  Labs (all labs ordered are listed, but only abnormal results are displayed) Labs Reviewed  CULTURE, GROUP A STREP [facility])  POCT RAPID STREP A    EKG None  Radiology No results found.  Procedures Procedures (including critical care time)  Medications Ordered in UC Medications  ketorolac (TORADOL) injection 60 mg (60 mg Intramuscular Given 09/11/17 1136)  cefTRIAXone (ROCEPHIN) injection 250 mg (250 mg Intramuscular Given 09/11/17 1226)  dexamethasone (DECADRON) injection 10 mg (10 mg Intramuscular Given 09/11/17 1226)    Initial Impression / Assessment and Plan / UC Course  I have reviewed the triage vital signs and the nursing notes.  Pertinent labs & imaging results that were available during my care of the patient were reviewed by me and considered in my medical decision making (see chart for details).     She was given Toradol for pain.  She was treated with antibiotics given her positive exposure to strep and the appearance of her throat.  Culture is done.  She was told she could stop her antibiotics if the culture comes back negative. Final Clinical Impressions(s) / UC Diagnoses   Final  diagnoses:  Acute tonsillitis, unspecified etiology  Otalgia of both ears     Discharge Instructions     Take antibiotic twice a day for 10 days We have sent a culture on your throat swab.  We will call you if  it is positive Take Tylenol or ibuprofen for pain I have given you a lidocaine mouthwash for throat pain Drink plenty of liquids Return if you are unable to keep down your medicine, or fluids   ED Prescriptions    Medication Sig Dispense Auth. Provider   penicillin v potassium (VEETID) 500 MG tablet Take 1 tablet (500 mg total) by mouth 2 (two) times daily for 10 days. 20 tablet Raylene Everts, MD   lidocaine (XYLOCAINE) 2 % solution Use 1 tablespoon as directed, gargle and spit, for throat pain 150 mL Raylene Everts, MD     Controlled Substance Prescriptions [place] Controlled Substance Registry consulted? Not Applicable   Raylene Everts, MD 09/11/17 1330

## 2017-09-11 NOTE — ED Triage Notes (Signed)
Pt c/o L sided ear pain, states its gotten worse and now the left side of her face is hurting and her neck feels swollen.

## 2017-09-13 LAB — CULTURE, GROUP A STREP (THRC)

## 2017-09-14 ENCOUNTER — Telehealth (HOSPITAL_COMMUNITY): Payer: Self-pay

## 2017-09-14 NOTE — Telephone Encounter (Signed)
Culture is positive for group A Strep germ.  Prescription for penicillin V 500mg  given at visit. Pt contacted and made aware. Recheck for further evaluation if symptoms are not improving.  Verbalized understanding.

## 2017-11-16 ENCOUNTER — Ambulatory Visit (HOSPITAL_COMMUNITY)
Admission: EM | Admit: 2017-11-16 | Discharge: 2017-11-16 | Disposition: A | Discharge status: Home / Self Care | Payer: Self-pay | Attending: Family Medicine | Admitting: Family Medicine

## 2017-11-16 ENCOUNTER — Telehealth (HOSPITAL_COMMUNITY): Payer: Self-pay | Admitting: Emergency Medicine

## 2017-11-16 ENCOUNTER — Other Ambulatory Visit: Payer: Self-pay

## 2017-11-16 ENCOUNTER — Encounter (HOSPITAL_COMMUNITY): Payer: Self-pay

## 2017-11-16 DIAGNOSIS — M5441 Lumbago with sciatica, right side: Secondary | ICD-10-CM

## 2017-11-16 MED ORDER — METHYLPREDNISOLONE SODIUM SUCC 125 MG IJ SOLR
INTRAMUSCULAR | Status: AC
  Filled 2017-11-16: qty 2

## 2017-11-16 MED ORDER — METHYLPREDNISOLONE SODIUM SUCC 125 MG IJ SOLR
125.0000 mg | Freq: Once | INTRAMUSCULAR | Status: AC
  Administered 2017-11-16: 125 mg via INTRAMUSCULAR

## 2017-11-16 MED ORDER — PREDNISONE 10 MG PO TABS: 20.0000 mg | ORAL_TABLET | Freq: Every day | ORAL | 0 refills | Status: DC

## 2017-11-16 MED ORDER — CYCLOBENZAPRINE HCL 10 MG PO TABS: 10.0000 mg | ORAL_TABLET | Freq: Every day | ORAL | 0 refills | Status: DC

## 2017-11-16 NOTE — Discharge Instructions (Signed)
Steroid shot given in office Continue conservative management of rest, ice, and gentle stretches Prednisone prescribed.  Use as directed and to completion.   Take cyclobenzaprine at nighttime for symptomatic relief. Avoid driving or operating heavy machinery while using medication. Follow up with PCP if symptoms persist Return or go to the ER if you have any new or worsening symptoms (fever, chills, chest pain, abdominal pain, changes in bowel or bladder habits, pain radiating into lower legs, etc...)

## 2017-11-16 NOTE — ED Provider Notes (Signed)
[facility]   [id number] 11/16/17 Arrival Time: 1806  CC: Back PAIN  SUBJECTIVE: History from: patient. Victoria Clayton is a 37 y.o. female complains of low back pain that is radiating into her right leg x 3 days.  Denies a precipitating event or specific injury.  Localizes the pain to the right low back.  Describes the pain as constant and shooting.  Has tried OTC medications without relief.  Symptoms are made worse with sitting and leaning forward.  Reports similar symptoms in the past.  Denies fever, chills, erythema, ecchymosis, effusion, weakness, numbness and tingling, bowel or bladder incontinence, or saddle paresthesias.    ROS: As per HPI.  Past Medical History:  Diagnosis Date  . Abnormal Pap smear   . Anemia   . Anxiety   . Asthma   . Depression   . Fibroid   . Infection    UTI  . Ovarian cyst   . 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 Vaginal Pap smear, abnormal    Past Surgical History:  Procedure Laterality Date  . CESAREAN SECTION    . DILATION AND CURETTAGE OF UTERUS    . HERNIA REPAIR    . TUBAL LIGATION     No Known Allergies No current facility-administered medications on file prior to encounter.    Current Outpatient Medications on File Prior to Encounter  Medication Sig Dispense Refill  . ibuprofen (ADVIL,MOTRIN) 200 MG tablet Take 800 mg by mouth every 6 (six) hours as needed for headache or mild pain.    Marland Kitchen lidocaine (XYLOCAINE) 2 % solution Use 1 tablespoon as directed, gargle and spit, for throat pain 150 mL 0   Social History   Socioeconomic History  . Marital status: Single    Spouse name: Not on file  . Number of children: Not on file  . Years of education: Not on file  . Highest education level: Not on file  Occupational History  . Not on file  Social Needs  . Financial resource strain: Not on file  . Food insecurity:    Worry: Not on file    Inability: Not on file  .  Transportation needs:    Medical: Not on file    Non-medical: Not on file  Tobacco Use  . Smoking status: Current Every Day Smoker    Packs/day: 1.00    Years: 15.00    Pack years: 15.00    Types: Cigarettes  . Smokeless tobacco: Never Used  Substance and Sexual Activity  . Alcohol use: Yes    Comment: social  . Drug use: Yes    Frequency: 3.0 times per week    Types: Marijuana  . Sexual activity: Yes    Birth control/protection: Surgical  Lifestyle  . Physical activity:    Days per week: Not on file    Minutes per session: Not on file  . Stress: Not on file  Relationships  . Social connections:    Talks on phone: Not on file    Gets together: Not on file    Attends religious service: Not on file    Active member of club or organization: Not on file    Attends meetings of clubs or organizations: Not on file    Relationship status: Not on file  . Intimate partner violence:    Fear of current or ex partner: Not on file    Emotionally abused: Not on file  Physically abused: Not on file    Forced sexual activity: Not on file  Other Topics Concern  . Not on file  Social History Narrative  . Not on file   Family History  Problem Relation Age of Onset  . Heart disease Mother        chf  . Hypertension Mother   . Fibromyalgia Mother   . COPD Mother   . Alcohol abuse Father   . Anesthesia problems Neg Hx     OBJECTIVE:  Vitals:   11/16/17 1856  Weight: 253 lb (114.8 kg)    General appearance: Alert, appears uncomfortable, standing/ pacing in room.  Head: NCAT Lungs: CTA bilaterally Heart: RRR.  Clear S1 and S2 without murmur, gallops, or rubs.  Radial pulses 2+ bilaterally. Musculoskeletal: Back Inspection: Skin warm, dry, clear and intact without obvious erythema, effusion, or ecchymosis.  Palpation: Diffusely tender about the right low back ROM: LROM due to patient discomfort Strength: 5/5 shld abduction, 5/5 shld adduction, 5/5 elbow flexion, 5/5 elbow  extension, 5/5 grip strength, 5/5 hip flexion, 5/5 knee abduction, 5/5 knee adduction, 5/5 knee flexion, 5/5 knee extension, 5/5 dorsiflexion, 5/5 plantar flexion Skin: warm and dry Neurologic: Ambulates without difficulty; Sensation intact about the upper/ lower extremities Psychological: alert and cooperative; normal mood and affect  ASSESSMENT & PLAN:  1. Acute right-sided low back pain with right-sided sciatica     Meds ordered this encounter  Medications  . methylPREDNISolone sodium succinate (SOLU-MEDROL) 125 mg/2 mL injection 125 mg  . cyclobenzaprine (FLEXERIL) 10 MG tablet    Sig: Take 1 tablet (10 mg total) by mouth at bedtime.    Dispense:  10 tablet    Refill:  0    Order Specific Question:   Supervising Provider    Answer:   Wynona Luna [phone]  . predniSONE (DELTASONE) 10 MG tablet    Sig: Take 2 tablets (20 mg total) by mouth daily.    Dispense:  15 tablet    Refill:  0    Order Specific Question:   Supervising Provider    Answer:   Wynona Luna [[id number]]   Steroid shot given in office Continue conservative management of rest, ice, and gentle stretches Prednisone prescribed.  Use as directed and to completion.   Take cyclobenzaprine at nighttime for symptomatic relief. Avoid driving or operating heavy machinery while using medication. Follow up with PCP if symptoms persist Return or go to the ER if you have any new or worsening symptoms (fever, chills, chest pain, abdominal pain, changes in bowel or bladder habits, pain radiating into lower legs, etc...)   Reviewed expectations re: course of current medical issues. Questions answered. Outlined signs and symptoms indicating need for more acute intervention. Patient verbalized understanding. After Visit Summary given.    Lestine Box, PA-C 11/16/17 1926

## 2017-11-16 NOTE — ED Triage Notes (Signed)
Pt c/o sciatica pain  X 3 days

## 2018-01-08 ENCOUNTER — Other Ambulatory Visit: Payer: Self-pay

## 2018-01-08 ENCOUNTER — Emergency Department (HOSPITAL_COMMUNITY)
Admission: EM | Admit: 2018-01-08 | Discharge: 2018-01-08 | Disposition: A | Discharge status: Home / Self Care | Payer: Self-pay | Attending: Emergency Medicine | Admitting: Emergency Medicine

## 2018-01-08 ENCOUNTER — Encounter (HOSPITAL_COMMUNITY): Payer: Self-pay | Admitting: Emergency Medicine

## 2018-01-08 ENCOUNTER — Emergency Department (HOSPITAL_COMMUNITY): Payer: Self-pay

## 2018-01-08 DIAGNOSIS — R69 Illness, unspecified: Secondary | ICD-10-CM

## 2018-01-08 DIAGNOSIS — J189 Pneumonia, unspecified organism: Secondary | ICD-10-CM

## 2018-01-08 DIAGNOSIS — F17210 Nicotine dependence, cigarettes, uncomplicated: Secondary | ICD-10-CM | POA: Insufficient documentation

## 2018-01-08 DIAGNOSIS — J45909 Unspecified asthma, uncomplicated: Secondary | ICD-10-CM | POA: Insufficient documentation

## 2018-01-08 DIAGNOSIS — J111 Influenza due to unidentified influenza virus with other respiratory manifestations: Secondary | ICD-10-CM

## 2018-01-08 DIAGNOSIS — J101 Influenza due to other identified influenza virus with other respiratory manifestations: Secondary | ICD-10-CM | POA: Insufficient documentation

## 2018-01-08 DIAGNOSIS — Z79899 Other long term (current) drug therapy: Secondary | ICD-10-CM | POA: Insufficient documentation

## 2018-01-08 DIAGNOSIS — J181 Lobar pneumonia, unspecified organism: Secondary | ICD-10-CM | POA: Insufficient documentation

## 2018-01-08 LAB — CBC WITH DIFFERENTIAL/PLATELET
ABS IMMATURE GRANULOCYTES: 0.02 10*3/uL (ref 0.00–0.07)
BASOS ABS: 0 10*3/uL (ref 0.0–0.1)
BASOS PCT: 1 %
Eosinophils Absolute: 0.1 10*3/uL (ref 0.0–0.5)
Eosinophils Relative: 2 %
HCT: 43.9 % (ref 36.0–46.0)
HEMOGLOBIN: 13.9 g/dL (ref 12.0–15.0)
IMMATURE GRANULOCYTES: 0 %
LYMPHS PCT: 50 %
Lymphs Abs: 3.7 10*3/uL (ref 0.7–4.0)
MCH: 29.8 pg (ref 26.0–34.0)
MCHC: 31.7 g/dL (ref 30.0–36.0)
MCV: 94.2 fL (ref 80.0–100.0)
Monocytes Absolute: 0.5 10*3/uL (ref 0.1–1.0)
Monocytes Relative: 7 %
NEUTROS ABS: 3 10*3/uL (ref 1.7–7.7)
NEUTROS PCT: 40 %
PLATELETS: 333 10*3/uL (ref 150–400)
RBC: 4.66 MIL/uL (ref 3.87–5.11)
RDW: 14 % (ref 11.5–15.5)
WBC: 7.5 10*3/uL (ref 4.0–10.5)
nRBC: 0 % (ref 0.0–0.2)

## 2018-01-08 LAB — COMPREHENSIVE METABOLIC PANEL
ALT: 19 U/L (ref 0–44)
AST: 24 U/L (ref 15–41)
Albumin: 3.4 g/dL — ABNORMAL LOW (ref 3.5–5.0)
Alkaline Phosphatase: 64 U/L (ref 38–126)
Anion gap: 10 (ref 5–15)
BUN: 5 mg/dL — ABNORMAL LOW (ref 6–20)
CHLORIDE: 107 mmol/L (ref 98–111)
CO2: 19 mmol/L — AB (ref 22–32)
CREATININE: 0.87 mg/dL (ref 0.44–1.00)
Calcium: 8.2 mg/dL — ABNORMAL LOW (ref 8.9–10.3)
Glucose, Bld: 121 mg/dL — ABNORMAL HIGH (ref 70–99)
POTASSIUM: 3.9 mmol/L (ref 3.5–5.1)
SODIUM: 136 mmol/L (ref 135–145)
Total Bilirubin: 0.4 mg/dL (ref 0.3–1.2)
Total Protein: 6.5 g/dL (ref 6.5–8.1)

## 2018-01-08 LAB — PREGNANCY, URINE: Preg Test, Ur: NEGATIVE

## 2018-01-08 LAB — LIPASE, BLOOD: LIPASE: 32 U/L (ref 11–51)

## 2018-01-08 MED ORDER — KETOROLAC TROMETHAMINE 30 MG/ML IJ SOLN
15.0000 mg | Freq: Once | INTRAMUSCULAR | Status: AC
  Administered 2018-01-08: 15 mg via INTRAVENOUS
  Filled 2018-01-08: qty 1

## 2018-01-08 MED ORDER — SODIUM CHLORIDE 0.9 % IV BOLUS
1000.0000 mL | Freq: Once | INTRAVENOUS | Status: AC
  Administered 2018-01-08: 1000 mL via INTRAVENOUS

## 2018-01-08 MED ORDER — ONDANSETRON 4 MG PO TBDP: 4.0000 mg | ORAL_TABLET | Freq: Three times a day (TID) | ORAL | 0 refills | Status: DC | PRN

## 2018-01-08 MED ORDER — OSELTAMIVIR PHOSPHATE 75 MG PO CAPS: 75.0000 mg | ORAL_CAPSULE | Freq: Two times a day (BID) | ORAL | 0 refills | Status: DC

## 2018-01-08 MED ORDER — ONDANSETRON HCL 4 MG/2ML IJ SOLN
4.0000 mg | Freq: Once | INTRAMUSCULAR | Status: AC
  Administered 2018-01-08: 4 mg via INTRAVENOUS
  Filled 2018-01-08: qty 2

## 2018-01-08 MED ORDER — AMOXICILLIN-POT CLAVULANATE 875-125 MG PO TABS: 1.0000 | ORAL_TABLET | Freq: Two times a day (BID) | ORAL | 0 refills | Status: DC

## 2018-01-08 NOTE — ED Notes (Signed)
Got patient undress on the monitor patient is resting with call bell in reach 

## 2018-01-08 NOTE — ED Triage Notes (Signed)
Patient c/o generalized weakness and vomiting and diarrhea onset of Sunday morning. Reports shaking and feeling sore all over. States she watched her grandkids this weekend who had a stomach bug.

## 2018-01-08 NOTE — ED Provider Notes (Signed)
[facility]PART[facility] Provider Note   CSN: [id number] Arrival date & time: 01/08/18  1105     History   Chief Complaint Chief Complaint  Patient presents with  . Emesis    HPI Victoria Clayton is a 38 y.o. female.  HPI  38 year old female presents with vomiting.  She started having vomiting the night of 1/4 and morning of 1/5.  Has concomitant diarrhea.  No blood in either.  Then started to develop cough, chest pain when coughing and some mid back pain.  No fevers or chills.  She has had diffuse body aches.  Multiple family members have had either stomach virus or kids were diagnosed with flu.  No headache but is having a sore throat.  She is having a hard time not vomiting.  She took some over-the-counter nausea medicine but it did not seem to help. No urinary symptoms.  Past Medical History:  Diagnosis Date  . Abnormal Pap smear   . Anemia   . Anxiety   . Asthma   . Depression   . Fibroid   . Infection    UTI  . Ovarian cyst   . 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 Vaginal Pap smear, abnormal     Patient Active Problem List   Diagnosis Date Noted  . Cocaine use 12/21/2014  . Left inguinal hernia 01/28/2013    Past Surgical History:  Procedure Laterality Date  . CESAREAN SECTION    . DILATION AND CURETTAGE OF UTERUS    . HERNIA REPAIR    . TUBAL LIGATION       OB History    Gravida  7   Para  3   Term  2   Preterm  1   AB  4   Living  3     SAB  3   TAB  1   Ectopic      Multiple      Live Births  3            Home Medications    Prior to Admission medications   Medication Sig Start Date End Date Taking? Authorizing Provider  amoxicillin-clavulanate (AUGMENTIN) 875-125 MG tablet Take 1 tablet by mouth 2 (two) times daily. One po bid x 7 days 01/08/18   Sherwood Gambler, MD  cyclobenzaprine (FLEXERIL) 10 MG tablet Take 1 tablet (10 mg total) by mouth  at bedtime. 11/16/17   [place], [place], PA-C  ibuprofen (ADVIL,MOTRIN) 200 MG tablet Take 800 mg by mouth every 6 (six) hours as needed for headache or mild pain.    [provider]  lidocaine (XYLOCAINE) 2 % solution Use 1 tablespoon as directed, gargle and spit, for throat pain 09/11/17   Raylene Everts, MD  ondansetron (ZOFRAN ODT) 4 MG disintegrating tablet Take 1 tablet (4 mg total) by mouth every 8 (eight) hours as needed for nausea or vomiting. 01/08/18   Sherwood Gambler, MD  oseltamivir (TAMIFLU) 75 MG capsule Take 1 capsule (75 mg total) by mouth every 12 (twelve) hours. 01/08/18   Sherwood Gambler, MD  predniSONE (DELTASONE) 10 MG tablet Take 2 tablets (20 mg total) by mouth daily. 11/16/17   Lestine Box, PA-C    Family History Family History  Problem Relation Age of Onset  . Heart disease Mother        chf  . Hypertension Mother   . Fibromyalgia Mother   .  COPD Mother   . Alcohol abuse Father   . Anesthesia problems Neg Hx     Social History Social History   Tobacco Use  . Smoking status: Current Every Day Smoker    Packs/day: 1.00    Years: 15.00    Pack years: 15.00    Types: Cigarettes  . Smokeless tobacco: Never Used  Substance Use Topics  . Alcohol use: Yes    Comment: social  . Drug use: Yes    Frequency: 3.0 times per week    Types: Marijuana     Allergies   Patient has no known allergies.   Review of Systems Review of Systems  Constitutional: Negative for chills and fever.  HENT: Positive for sore throat.   Respiratory: Positive for cough and shortness of breath.   Cardiovascular: Positive for chest pain.  Gastrointestinal: Positive for diarrhea and vomiting. Negative for abdominal pain and blood in stool.  Genitourinary: Negative for dysuria.  Neurological: Negative for headaches.  All other systems reviewed and are negative.    Physical Exam Updated Vital Signs BP (!) 148/87   Pulse 87   Temp 98.4 F (36.9 C) (Oral)   Resp  20   SpO2 100%   Physical Exam Vitals signs and nursing note reviewed.  Constitutional:      Appearance: She is well-developed. She is obese.     Comments: Actively coughing/dry heaving  HENT:     Head: Normocephalic and atraumatic.     Right Ear: External ear normal.     Left Ear: External ear normal.     Nose: Nose normal.     Mouth/Throat:     Pharynx: No oropharyngeal exudate.  Eyes:     General:        Right eye: No discharge.        Left eye: No discharge.  Cardiovascular:     Rate and Rhythm: Normal rate and regular rhythm.     Heart sounds: Normal heart sounds.  Pulmonary:     Effort: Pulmonary effort is normal.     Breath sounds: Normal breath sounds. No wheezing or rales.  Abdominal:     Palpations: Abdomen is soft.     Tenderness: There is no abdominal tenderness. There is no right CVA tenderness or left CVA tenderness.  Skin:    General: Skin is warm and dry.  Neurological:     Mental Status: She is alert.  Psychiatric:        Mood and Affect: Mood is not anxious.      ED Treatments / Results  Labs (all labs ordered are listed, but only abnormal results are displayed) Labs Reviewed  COMPREHENSIVE METABOLIC PANEL - Abnormal; Notable for the following components:      Result Value   CO2 19 (*)    Glucose, Bld 121 (*)    BUN 5 (*)    Calcium 8.2 (*)    Albumin 3.4 (*)    All other components within normal limits  LIPASE, BLOOD  CBC WITH DIFFERENTIAL/PLATELET  PREGNANCY, URINE  I-STAT BETA HCG BLOOD, ED ([facility], [facility], [facility] ONLY)    EKG None  Radiology Dg Chest 2 View  Result Date: 01/08/2018 CLINICAL DATA:  38 year old female with a history cough EXAM: CHEST - 2 VIEW COMPARISON:  07/12/2016 FINDINGS: Cardiomediastinal silhouette within normal limits. Reticular opacity at the right lung base greater than left lung base. No pneumothorax. No pleural effusion. No displaced fracture. IMPRESSION: Reticular opacity at the right lung base,  potentially atypical  infection. Electronically Signed   By: Corrie Mckusick D.O.   On: 01/08/2018 12:44    Procedures Procedures (including critical care time)  Medications Ordered in ED Medications  sodium chloride 0.9 % bolus 1,000 mL (0 mLs Intravenous Stopped 01/08/18 1249)  ondansetron (ZOFRAN) injection 4 mg (4 mg Intravenous Given 01/08/18 1148)  ketorolac (TORADOL) 30 MG/ML injection 15 mg (15 mg Intravenous Given 01/08/18 1148)     Initial Impression / Assessment and Plan / ED Course  I have reviewed the triage vital signs and the nursing notes.  Pertinent labs & imaging results that were available during my care of the patient were reviewed by me and considered in my medical decision making (see chart for details).     Clinically the patient appears to have the flu.  She is within the window for Tamiflu and given possible early pneumonia seen on the chest x-ray I think is reasonable to treat.  Otherwise her vital signs are better besides some moderate hypertension.  No headache, altered mental status, or signs/symptoms of meningitis.  She will be treated with Tamiflu as well as a course of antibiotics.  She is feeling much better after treatment with fluids, Toradol, and Zofran.  Will discharge home, give work note, and we discussed return precautions but she appears stable for outpatient follow-up.  Final Clinical Impressions(s) / ED Diagnoses   Final diagnoses:  Influenza-like illness  Community acquired pneumonia of right lower lobe of lung [facility])    ED Discharge Orders         Ordered    oseltamivir (TAMIFLU) 75 MG capsule  Every 12 hours     01/08/18 1357    amoxicillin-clavulanate (AUGMENTIN) 875-125 MG tablet  2 times daily     01/08/18 1357    ondansetron (ZOFRAN ODT) 4 MG disintegrating tablet  Every 8 hours PRN     01/08/18 1357           Sherwood Gambler, MD 01/08/18 1536

## 2018-01-08 NOTE — Discharge Instructions (Signed)
If you develop high fever, recurrent or uncontrolled vomiting, trouble breathing, severe headache or neck stiffness, or any other new/concerning symptoms then return to the ER for evaluation.

## 2018-01-08 NOTE — ED Notes (Signed)
Discharge instructions and prescriptions discussed with Pt. Pt verbalized understanding. Pt stable and ambulatory.   

## 2018-01-11 ENCOUNTER — Emergency Department (HOSPITAL_COMMUNITY)
Admission: EM | Admit: 2018-01-11 | Discharge: 2018-01-11 | Disposition: A | Discharge status: Home / Self Care | Payer: Self-pay | Attending: Emergency Medicine | Admitting: Emergency Medicine

## 2018-01-11 ENCOUNTER — Encounter (HOSPITAL_COMMUNITY): Payer: Self-pay

## 2018-01-11 ENCOUNTER — Other Ambulatory Visit: Payer: Self-pay

## 2018-01-11 ENCOUNTER — Emergency Department (HOSPITAL_COMMUNITY): Payer: Self-pay

## 2018-01-11 DIAGNOSIS — F17210 Nicotine dependence, cigarettes, uncomplicated: Secondary | ICD-10-CM | POA: Insufficient documentation

## 2018-01-11 DIAGNOSIS — R079 Chest pain, unspecified: Secondary | ICD-10-CM

## 2018-01-11 DIAGNOSIS — J189 Pneumonia, unspecified organism: Secondary | ICD-10-CM | POA: Insufficient documentation

## 2018-01-11 DIAGNOSIS — R0602 Shortness of breath: Secondary | ICD-10-CM

## 2018-01-11 DIAGNOSIS — R0789 Other chest pain: Secondary | ICD-10-CM | POA: Insufficient documentation

## 2018-01-11 DIAGNOSIS — F1210 Cannabis abuse, uncomplicated: Secondary | ICD-10-CM | POA: Insufficient documentation

## 2018-01-11 DIAGNOSIS — J181 Lobar pneumonia, unspecified organism: Secondary | ICD-10-CM

## 2018-01-11 DIAGNOSIS — R0981 Nasal congestion: Secondary | ICD-10-CM | POA: Insufficient documentation

## 2018-01-11 DIAGNOSIS — R1110 Vomiting, unspecified: Secondary | ICD-10-CM | POA: Insufficient documentation

## 2018-01-11 LAB — I-STAT CG4 LACTIC ACID, ED
LACTIC ACID, VENOUS: 0.93 mmol/L (ref 0.5–1.9)
Lactic Acid, Venous: 0.86 mmol/L (ref 0.5–1.9)

## 2018-01-11 LAB — CBC WITH DIFFERENTIAL/PLATELET
Abs Immature Granulocytes: 0.03 10*3/uL (ref 0.00–0.07)
BASOS ABS: 0 10*3/uL (ref 0.0–0.1)
BASOS PCT: 0 %
EOS ABS: 0.1 10*3/uL (ref 0.0–0.5)
Eosinophils Relative: 1 %
HEMATOCRIT: 42.9 % (ref 36.0–46.0)
Hemoglobin: 13.7 g/dL (ref 12.0–15.0)
IMMATURE GRANULOCYTES: 0 %
LYMPHS ABS: 3.2 10*3/uL (ref 0.7–4.0)
Lymphocytes Relative: 30 %
MCH: 29.8 pg (ref 26.0–34.0)
MCHC: 31.9 g/dL (ref 30.0–36.0)
MCV: 93.3 fL (ref 80.0–100.0)
Monocytes Absolute: 0.5 10*3/uL (ref 0.1–1.0)
Monocytes Relative: 5 %
NEUTROS PCT: 64 %
NRBC: 0 % (ref 0.0–0.2)
Neutro Abs: 7 10*3/uL (ref 1.7–7.7)
PLATELETS: 359 10*3/uL (ref 150–400)
RBC: 4.6 MIL/uL (ref 3.87–5.11)
RDW: 13.8 % (ref 11.5–15.5)
WBC: 10.9 10*3/uL — AB (ref 4.0–10.5)

## 2018-01-11 LAB — COMPREHENSIVE METABOLIC PANEL
ALK PHOS: 62 U/L (ref 38–126)
ALT: 28 U/L (ref 0–44)
AST: 25 U/L (ref 15–41)
Albumin: 3.4 g/dL — ABNORMAL LOW (ref 3.5–5.0)
Anion gap: 8 (ref 5–15)
BUN: 11 mg/dL (ref 6–20)
CALCIUM: 9.1 mg/dL (ref 8.9–10.3)
CO2: 23 mmol/L (ref 22–32)
Chloride: 105 mmol/L (ref 98–111)
Creatinine, Ser: 1.01 mg/dL — ABNORMAL HIGH (ref 0.44–1.00)
GFR calc Af Amer: >60 mL/min (ref >60)
GFR calc non Af Amer: >60 mL/min (ref >60)
Glucose, Bld: 108 mg/dL — ABNORMAL HIGH (ref 70–99)
Potassium: 4 mmol/L (ref 3.5–5.1)
SODIUM: 136 mmol/L (ref 135–145)
Total Bilirubin: 0.3 mg/dL (ref 0.3–1.2)
Total Protein: 7.1 g/dL (ref 6.5–8.1)

## 2018-01-11 LAB — RAPID URINE DRUG SCREEN, HOSP PERFORMED
Amphetamines: NOT DETECTED
Barbiturates: NOT DETECTED
Benzodiazepines: NOT DETECTED
Cocaine: NOT DETECTED
Opiates: NOT DETECTED
Tetrahydrocannabinol: POSITIVE — AB

## 2018-01-11 LAB — I-STAT TROPONIN, ED: Troponin i, poc: 0 ng/mL (ref 0.00–0.08)

## 2018-01-11 LAB — I-STAT BETA HCG BLOOD, ED (MC, WL, AP ONLY)

## 2018-01-11 MED ORDER — ALBUTEROL SULFATE (2.5 MG/3ML) 0.083% IN NEBU
5.0000 mg | INHALATION_SOLUTION | Freq: Once | RESPIRATORY_TRACT | Status: AC
  Administered 2018-01-11: 5 mg via RESPIRATORY_TRACT
  Filled 2018-01-11: qty 6

## 2018-01-11 MED ORDER — ALBUTEROL SULFATE HFA 108 (90 BASE) MCG/ACT IN AERS: 1.0000 | INHALATION_SPRAY | Freq: Four times a day (QID) | RESPIRATORY_TRACT | 0 refills | Status: DC | PRN

## 2018-01-11 MED ORDER — AZITHROMYCIN 250 MG PO TABS: 250.0000 mg | ORAL_TABLET | Freq: Every day | ORAL | 0 refills | Status: DC

## 2018-01-11 MED ORDER — IBUPROFEN 800 MG PO TABS
800.0000 mg | ORAL_TABLET | Freq: Once | ORAL | Status: AC
  Administered 2018-01-11: 800 mg via ORAL
  Filled 2018-01-11: qty 1

## 2018-01-11 MED ORDER — SODIUM CHLORIDE 0.9 % IV BOLUS
1000.0000 mL | Freq: Once | INTRAVENOUS | Status: AC
  Administered 2018-01-11: 1000 mL via INTRAVENOUS

## 2018-01-11 NOTE — Discharge Instructions (Addendum)
You have been seen today for shortness of breath. Please read and follow all provided instructions.   1. Medications: Azithromycin (antibiotic) - please take this in combination with the other antibiotic you were prescribed, Albuterol for wheezing, usual home medications 2. Treatment: rest, drink plenty of fluids 3. Follow Up: Please follow up with your primary doctor in 2-5 days for discussion of your diagnoses and further evaluation after today's visit; if you do not have a primary care doctor use the resource guide provided to find one; Please return to the ER for any new or worsening symptoms. Please obtain all of your results from medical records or have your doctors office obtain the results - share them with your doctor - you should be seen at your doctors office. Call today to arrange your follow up.   Take medications as prescribed. Please review all of the medicines and only take them if you do not have an allergy to them. Return to the emergency room for worsening condition or new concerning symptoms. Follow up with your regular doctor. If you don't have a regular doctor use one of the numbers below to establish a primary care doctor.  Please be aware that if you are taking birth control pills, taking other prescriptions, ESPECIALLY ANTIBIOTICS may make the birth control ineffective - if this is the case, either do not engage in sexual activity or use alternative methods of birth control such as condoms until you have finished the medicine and your family doctor says it is OK to restart them. If you are on a blood thinner such as COUMADIN, be aware that any other medicine that you take may cause the coumadin to either work too much, or not enough - you should have your coumadin level rechecked in next 7 days if this is the case.  ?  It is also a possibility that you have an allergic reaction to any of the medicines that you have been prescribed - Everybody reacts differently to medications and  while MOST people have no trouble with most medicines, you may have a reaction such as nausea, vomiting, rash, swelling, shortness of breath. If this is the case, please stop taking the medicine immediately and contact your physician.  ?  You should return to the ER if you develop severe or worsening symptoms.   Emergency Department Resource Guide 1) Find a Doctor and Pay Out of Pocket Although you won't have to find out who is covered by your insurance plan, it is a good idea to ask around and get recommendations. You will then need to call the office and see if the doctor you have chosen will accept you as a new patient and what types of options they offer for patients who are self-pay. Some doctors offer discounts or will set up payment plans for their patients who do not have insurance, but you will need to ask so you aren't surprised when you get to your appointment.  2) Contact Your Local Health Department Not all health departments have doctors that can see patients for sick visits, but many do, so it is worth a call to see if yours does. If you don't know where your local health department is, you can check in your phone book. The CDC also has a tool to help you locate your state's health department, and many state websites also have listings of all of their local health departments.  3) Find a [facility] If your illness is not likely to be  very severe or complicated, you may want to try a walk in clinic. These are popping up all over the country in pharmacies, drugstores, and shopping centers. They're usually staffed by nurse practitioners or physician assistants that have been trained to treat common illnesses and complaints. They're usually fairly quick and inexpensive. However, if you have serious medical issues or chronic medical problems, these are probably not your best option.  No Primary Care Doctor: Call Health Connect at  [phone] - they can help you locate a primary care doctor  that  accepts your insurance, provides certain services, etc. Physician Referral [phone]  Emergency Department Resource Guide 1) Find a Doctor and Pay Out of Pocket Although you won't have to find out who is covered by your insurance plan, it is a good idea to ask around and get recommendations. You will then need to call the office and see if the doctor you have chosen will accept you as a new patient and what types of options they offer for patients who are self-pay. Some doctors offer discounts or will set up payment plans for their patients who do not have insurance, but you will need to ask so you aren't surprised when you get to your appointment.  2) Contact Your Local Health Department Not all health departments have doctors that can see patients for sick visits, but many do, so it is worth a call to see if yours does. If you don't know where your local health department is, you can check in your phone book. The CDC also has a tool to help you locate your state's health department, and many state websites also have listings of all of their local health departments.  3) Find a [facility] If your illness is not likely to be very severe or complicated, you may want to try a walk in clinic. These are popping up all over the country in pharmacies, drugstores, and shopping centers. They're usually staffed by nurse practitioners or physician assistants that have been trained to treat common illnesses and complaints. They're usually fairly quick and inexpensive. However, if you have serious medical issues or chronic medical problems, these are probably not your best option.  No Primary Care Doctor: Call Health Connect at  [phone] - they can help you locate a primary care doctor that  accepts your insurance, provides certain services, etc. Physician Referral Service- [phone]  Chronic Pain Problems: Organization         Address  Phone   Notes  [facility]  [phone] Patients need to be referred by their primary care doctor.   Medication Assistance: Organization         Address  Phone   Notes  [facility] Medication [facility] [address] [place], [place] [address] [phone] --Must be a resident of [facility] -- Must have NO insurance coverage whatsoever (no Medicaid/ Medicare, etc.) -- The pt. MUST have a primary care doctor that directs their care regularly and follows them in the community   [facility]  [phone]   [employer]  [phone]    Agencies that provide inexpensive medical care: Organization         Address  Phone   Notes  [place]  [phone]   [facility]    [phone]   [facility] [place], [place] [address] [phone]   [place]  [address] [place] [phone]   [facility]    [phone]   [facility]    [phone]   [place] and [address], [place] Phone:  [phone], Fax:  [phone] Hours of Operation:  9 am - 6 pm, M-F.  Also accepts Medicaid/Medicare and self-pay.  [facility] for [place] [address] [place], [address], [place] Phone: [phone], Fax: [phone]. Hours of Operation:  8:30 am - 5:30 pm, M-F.  Also accepts Medicaid and self-pay.  [facility] [address], [place] Phone: [phone]   [place], [place], [place] [phone], Ext. [phone] Mondays & Thursdays: 7-9 AM.  First 15 patients are seen on a first come, first serve basis.    [place] Providers:  Organization         Address  Phone   Notes  [facility] [address], [place] [phone] Also accepts self-pay patients.  [facility] [address], [place]  [phone]   [place], [address], [place] [phone]   [facility] [address] [place] [phone]   Lucianne Lei [address], [place]   [phone] Only accepts [place] patients after they have their name applied to their card.   Self-Pay (no insurance) in [facility]:  Organization         Address  Phone   Notes  Sickle Cell Patients, [facility] [place] [phone]   [facility] [place] [phone]   [facility] [place]  [place], [place], [place] [phone]   [facility]/Dr. Osei-Bonsu  [address] [place] or [address], [place] [phone] Phone number for both [place] and [place] locations is the same.  Urgent Medical and [facility] [address] [place] [phone]   [facility] [address], [place] or [address] [phone]   [facility] [address] [place] [phone], phone; [phone], fax Sees patients 1st and 3rd Saturday of every month.  Must not qualify for public or private insurance (i.e. Medicaid, Medicare, [place] [employer] Choice, Veterans' Benefits)  Household income should be no more than 200% of the poverty level The clinic cannot treat you if you are pregnant or think you are pregnant  Sexually transmitted diseases are not treated at the clinic.

## 2018-01-11 NOTE — ED Triage Notes (Signed)
Pt endorses shob for several days. Here Monday and diagnosed with pneumonia and flu, sent home with tamiflu and antibiotics. Pt visibly tachypneic and speaking 3 word sentences.

## 2018-01-11 NOTE — ED Provider Notes (Signed)
[facility] EMERGENCY DEPARTMENT Provider Note   CSN: [id number] Arrival date & time: 01/11/18  1052  History   Chief Complaint Chief Complaint  Patient presents with  . Shortness of Breath  . Pneumonia  . Influenza    HPI Victoria Clayton is a 38 y.o. female with a PMH of asthma, anxiety, and depression presenting with intermittent shortness of breath onset this morning at 10am after cleaning the floors. 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 Patient reports associated episode of vomiting with this event. Patient states she was diagnosed with influenza and pneumonia on 01/06 and prescribed Augmentin and tamiflu. Patient reports associated congestion, rhinorrhea, chills, and a productive cough. Patient denies fever. Patient reports symptoms have improved until she went to work today. Patient also reports intermittent middle chest pain radiating to the left chest onset 2017. Patient reports episodes of chest tightness last a few minutes and resolve on their own. Patient reports marijuana use, but denies other drug use. Patient has a history of chest muscle spasms due to cocaine use. Patient denies recent travel, OCPs, recent surgery, or leg edema/pain.   HPI  Past Medical History:  Diagnosis Date  . Abnormal Pap smear   . Anemia   . Anxiety   . Asthma   . Depression   . Fibroid   . Infection    UTI  . Ovarian cyst   . 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 Vaginal Pap smear, abnormal     Patient Active Problem List   Diagnosis Date Noted  . Cocaine use 12/21/2014  . Left inguinal hernia 01/28/2013    Past Surgical History:  Procedure Laterality Date  . CESAREAN SECTION    . DILATION AND CURETTAGE OF UTERUS    . HERNIA REPAIR    . TUBAL LIGATION       OB History    Gravida  7   Para  3   Term  2   Preterm  1     AB  4   Living  3     SAB  3   TAB  1   Ectopic      Multiple      Live Births  3            Home Medications    Prior to Admission medications   Medication Sig Start Date End Date Taking? Authorizing Provider  amoxicillin-clavulanate (AUGMENTIN) 875-125 MG tablet Take 1 tablet by mouth 2 (two) times daily. One po bid x 7 days Patient taking differently: Take 1 tablet by mouth 2 (two) times daily. For 7 days 01/08/18  Yes Sherwood Gambler, MD  ibuprofen (ADVIL,MOTRIN) 200 MG tablet Take 800 mg by mouth every 6 (six) hours as needed for headache or mild pain.   Yes [provider]  oseltamivir (TAMIFLU) 75 MG capsule Take 1 capsule (75 mg total) by mouth every 12 (twelve) hours. 01/08/18  Yes Sherwood Gambler, MD  albuterol (PROVENTIL HFA;VENTOLIN HFA) 108 (90 Base) MCG/ACT inhaler Inhale 1-2 puffs into the lungs every 6 (six) hours as needed for wheezing or shortness of breath. 01/11/18   Darlin Drop P, PA-C  azithromycin (ZITHROMAX) 250 MG tablet Take 1 tablet (250 mg total) by mouth daily. Take first 2 tablets together, then 1 every day until finished. 01/11/18   Arville Lime, PA-C  cyclobenzaprine (FLEXERIL) 10 MG tablet Take 1 tablet (10 mg total) by mouth at bedtime. Patient not taking: Reported on 01/11/2018 11/16/17   [place], [place], PA-C  ondansetron (ZOFRAN ODT) 4 MG disintegrating tablet Take 1 tablet (4 mg total) by mouth every 8 (eight) hours as needed for nausea or vomiting. Patient not taking: Reported on 01/11/2018 01/08/18   Sherwood Gambler, MD    Family History Family History  Problem Relation Age of Onset  . Heart disease Mother        chf  . Hypertension Mother   . Fibromyalgia Mother   . COPD Mother   . Alcohol abuse Father   . Anesthesia problems Neg Hx     Social History Social History   Tobacco Use  . Smoking status: Current Every Day Smoker    Packs/day: 1.00    Years: 15.00    Pack years: 15.00    Types: Cigarettes  . Smokeless  tobacco: Never Used  Substance Use Topics  . Alcohol use: Yes    Comment: social  . Drug use: Yes    Frequency: 3.0 times per week    Types: Marijuana     Allergies   Amoxicillin and Penicillins   Review of Systems Review of Systems  Constitutional: Positive for chills. Negative for activity change, appetite change, diaphoresis, fatigue, fever and unexpected weight change.  HENT: Positive for congestion, rhinorrhea and sore throat. Negative for ear pain.   Respiratory: Positive for cough, chest tightness, shortness of breath and wheezing.   Cardiovascular: Positive for chest pain. Negative for palpitations and leg swelling.  Gastrointestinal: Positive for vomiting. Negative for abdominal pain and nausea.  Endocrine: Negative for cold intolerance and heat intolerance.  Musculoskeletal: Positive for myalgias. Negative for back pain.  Skin: Negative for rash.  Allergic/Immunologic: Negative for immunocompromised state.  Neurological: Negative for dizziness, syncope, weakness and light-headedness.  Psychiatric/Behavioral: Negative for agitation and behavioral problems. The patient is not nervous/anxious.      Physical Exam Updated Vital Signs BP (!) 131/106   Pulse 77   Temp 97.6 F (36.4 C)   Resp 14   SpO2 97%   Physical Exam Vitals signs and nursing note reviewed.  Constitutional:      General: She is not in acute distress.    Appearance: She is well-developed. She is not diaphoretic.  HENT:     Head: Normocephalic and atraumatic.     Nose: Mucosal edema, congestion and rhinorrhea present.     Mouth/Throat:     Pharynx: Uvula midline. No pharyngeal swelling, oropharyngeal exudate or posterior oropharyngeal erythema.  Neck:     Musculoskeletal: Normal range of motion and neck supple.     Vascular: No JVD.  Cardiovascular:     Rate and Rhythm: Normal rate and regular rhythm.     Pulses: Normal pulses.          Radial pulses are 2+ on the right side and 2+ on the  left side.       Dorsalis pedis pulses are 2+ on the right side and 2+ on the left side.     Heart sounds: Normal heart sounds. No murmur. No friction rub. No gallop.   Pulmonary:     Effort: Pulmonary effort is normal. No tachypnea, accessory muscle usage or respiratory distress.     Breath sounds: Examination of the right-middle field reveals decreased breath sounds. Examination of the right-lower field reveals decreased breath sounds. Decreased breath sounds present. No wheezing, rhonchi or rales.  Comments: Pt is speaking in full sentences without difficulty.  Chest:     Chest wall: Tenderness (Mild tenderness upon palpation on middle of chest.) present.  Abdominal:     Palpations: Abdomen is soft.     Tenderness: There is no abdominal tenderness.  Musculoskeletal: Normal range of motion.     Right lower leg: She exhibits no tenderness. No edema.     Left lower leg: She exhibits no tenderness. No edema.  Skin:    General: Skin is warm.     Capillary Refill: Capillary refill takes less than 2 seconds.     Coloration: Skin is not pale.     Findings: No rash.  Neurological:     Mental Status: She is alert and oriented to person, place, and time.     ED Treatments / Results  Labs (all labs ordered are listed, but only abnormal results are displayed) Labs Reviewed  COMPREHENSIVE METABOLIC PANEL - Abnormal; Notable for the following components:      Result Value   Glucose, Bld 108 (*)    Creatinine, Ser 1.01 (*)    Albumin 3.4 (*)    All other components within normal limits  CBC WITH DIFFERENTIAL/PLATELET - Abnormal; Notable for the following components:   WBC 10.9 (*)    All other components within normal limits  RAPID URINE DRUG SCREEN, HOSP PERFORMED - Abnormal; Notable for the following components:   Tetrahydrocannabinol POSITIVE (*)    All other components within normal limits  I-STAT CG4 LACTIC ACID, ED  I-STAT BETA HCG BLOOD, ED ([facility], [facility], [facility] ONLY)  I-STAT CG4 LACTIC  ACID, ED  I-STAT TROPONIN, ED    EKG EKG Interpretation  Date/Time:  Thursday January 11 2018 10:58:21 EST Ventricular Rate:  75 PR Interval:  146 QRS Duration: 80 QT Interval:  394 QTC Calculation: 439 R Axis:   4 Text Interpretation:  Normal sinus rhythm Normal ECG agree no change from previous Confirmed by Charlesetta Shanks [phone]) on 01/11/2018 4:08:09 PM   Radiology Dg Chest 2 View  Result Date: 01/11/2018 CLINICAL DATA:  Shortness of breath for several days. EXAM: CHEST - 2 VIEW COMPARISON:  01/08/2018 FINDINGS: Cardiomediastinal silhouette is normal. Mediastinal contours appear intact. There is no evidence of pleural effusion or pneumothorax. Redemonstrated subtle reticular opacities at the right lung base. Osseous structures are without acute abnormality. Soft tissues are grossly normal. IMPRESSION: Subtle reticular opacities in the right lung base, which may be seen with bronchitic changes or early bronchopneumonia. Electronically Signed   By: Fidela Salisbury M.D.   On: 01/11/2018 11:57    Procedures Procedures (including critical care time)  Medications Ordered in ED Medications  albuterol (PROVENTIL) (2.5 MG/3ML) 0.083% nebulizer solution 5 mg (5 mg Nebulization Given 01/11/18 1106)  sodium chloride 0.9 % bolus 1,000 mL (0 mLs Intravenous Stopped 01/11/18 1542)  ibuprofen (ADVIL,MOTRIN) tablet 800 mg (800 mg Oral Given 01/11/18 1422)     Initial Impression / Assessment and Plan / ED Course  I have reviewed the triage vital signs and the nursing notes.  Pertinent labs & imaging results that were available during my care of the patient were reviewed by me and considered in my medical decision making (see chart for details).  Clinical Course as of Jan 12 1607  Thu Jan 11, 2018  1250 Subtle reticular opacities in the right lung base, which may be seen with bronchitic changes or early bronchopneumonia.    DG Chest 2 View [AH]  1309 Leukocytosis noted  at 10.9 likely due to  pneumonia.  WBC(!): 10.9 [AH]  1323 Slightly elevated creatinine. Will provide IVF.  Creatinine(!): 1.01 [AH]  1512 UDS is positive for cannabis.   Tetrahydrocannabinol(!): POSITIVE [AH]    Clinical Course User Index [AH] Arville Lime, PA-C   Suspect shortness of breath may be likely due to asthma triggered with the cleaning agents. Symptoms have improved in the ER with albuterol. Will prescribe albuterol for wheezing. Will prescribe azithromycin in addition to Augmentin for pneumonia. Discussed return precautions with patient. Chest pain is not likely of cardiac etiology d/t presentation, PERC negative, VSS, no tracheal deviation, no JVD or new murmur, RRR, breath sounds equal bilaterally, EKG without acute abnormalities, negative troponin, and CXR reveals reticular opacities in right lung base consistent with pneumonia. UDS is positive for cannabis, but negative for cocaine. Pt has been advised to return to the ED if CP becomes exertional, associated with diaphoresis or nausea, radiates to left jaw/arm, worsens or becomes concerning in any way. Patient is to be discharged with recommendation to follow up with PCP in regards to today's hospital visit. Pt appears reliable for follow up and is agreeable to discharge.   Case has been discussed with and seen by Dr. Johnney Killian who agrees with the above plan to discharge.   Final Clinical Impressions(s) / ED Diagnoses   Final diagnoses:  Shortness of breath  Community acquired pneumonia of right lower lobe of lung ([place])  Left-sided chest pain    ED Discharge Orders         Ordered    azithromycin (ZITHROMAX) 250 MG tablet  Daily     01/11/18 1519    albuterol (PROVENTIL HFA;VENTOLIN HFA) 108 (90 Base) MCG/ACT inhaler  Every 6 hours PRN     01/11/18 1519           Arville Lime, PA-C 01/11/18 1609    Charlesetta Shanks, MD 01/20/18 1715

## 2018-01-11 NOTE — ED Notes (Signed)
Patient verbalizes understanding of discharge instructions. Opportunity for questioning and answers were provided. Armband removed by staff, pt discharged from ED ambulatory.   

## 2018-01-11 NOTE — ED Notes (Signed)
Called x-ray and is being transported to room.

## 2018-04-20 ENCOUNTER — Other Ambulatory Visit: Payer: Self-pay

## 2018-04-20 ENCOUNTER — Encounter (HOSPITAL_COMMUNITY): Payer: Self-pay

## 2018-04-20 ENCOUNTER — Emergency Department (HOSPITAL_COMMUNITY): Payer: Self-pay

## 2018-04-20 ENCOUNTER — Emergency Department (HOSPITAL_COMMUNITY)
Admission: EM | Admit: 2018-04-20 | Discharge: 2018-04-20 | Disposition: A | Discharge status: Home / Self Care | Payer: Self-pay | Attending: Emergency Medicine | Admitting: Emergency Medicine

## 2018-04-20 DIAGNOSIS — J45909 Unspecified asthma, uncomplicated: Secondary | ICD-10-CM | POA: Insufficient documentation

## 2018-04-20 DIAGNOSIS — M25571 Pain in right ankle and joints of right foot: Secondary | ICD-10-CM | POA: Insufficient documentation

## 2018-04-20 DIAGNOSIS — M79671 Pain in right foot: Secondary | ICD-10-CM

## 2018-04-20 DIAGNOSIS — Z79899 Other long term (current) drug therapy: Secondary | ICD-10-CM | POA: Insufficient documentation

## 2018-04-20 MED ORDER — KETOROLAC TROMETHAMINE 30 MG/ML IJ SOLN
30.0000 mg | Freq: Once | INTRAMUSCULAR | Status: AC
  Administered 2018-04-20: 30 mg via INTRAMUSCULAR
  Filled 2018-04-20: qty 1

## 2018-04-20 MED ORDER — HYDROCODONE-ACETAMINOPHEN 5-325 MG PO TABS: 1.0000 | ORAL_TABLET | Freq: Four times a day (QID) | ORAL | 0 refills | Status: DC | PRN

## 2018-04-20 NOTE — Discharge Instructions (Addendum)
You have been seen today for right ankle and foot pain. Please read and follow all provided instructions.   1. Medications: pain medicine for severe pain (do not drive or operate heavy machinery while taking this medication), usual home medications 2. Treatment: rest, drink plenty of fluids 3. Follow Up: Please follow up with your primary doctor in 2 days for discussion of your diagnoses and further evaluation after today's visit; if you do not have a primary care doctor use the resource guide provided to find one; Please return to the ER for any new or worsening symptoms. Please obtain all of your results from medical records or have your doctors office obtain the results - share them with your doctor - you should be seen at your doctors office. Call today to arrange your follow up.   Take medications as prescribed. Please review all of the medicines and only take them if you do not have an allergy to them. Return to the emergency room for worsening condition or new concerning symptoms. Follow up with your regular doctor. If you don't have a regular doctor use one of the numbers below to establish a primary care doctor.  Please be aware that if you are taking birth control pills, taking other prescriptions, ESPECIALLY ANTIBIOTICS may make the birth control ineffective - if this is the case, either do not engage in sexual activity or use alternative methods of birth control such as condoms until you have finished the medicine and your family doctor says it is OK to restart them. If you are on a blood thinner such as COUMADIN, be aware that any other medicine that you take may cause the coumadin to either work too much, or not enough - you should have your coumadin level rechecked in next 7 days if this is the case.  ?  It is also a possibility that you have an allergic reaction to any of the medicines that you have been prescribed - Everybody reacts differently to medications and while MOST people have no  trouble with most medicines, you may have a reaction such as nausea, vomiting, rash, swelling, shortness of breath. If this is the case, please stop taking the medicine immediately and contact your physician.  ?  You should return to the ER if you develop severe or worsening symptoms.   Emergency Department Resource Guide 1) Find a Doctor and Pay Out of Pocket Although you won't have to find out who is covered by your insurance plan, it is a good idea to ask around and get recommendations. You will then need to call the office and see if the doctor you have chosen will accept you as a new patient and what types of options they offer for patients who are self-pay. Some doctors offer discounts or will set up payment plans for their patients who do not have insurance, but you will need to ask so you aren't surprised when you get to your appointment.  2) Contact Your Local Health Department Not all health departments have doctors that can see patients for sick visits, but many do, so it is worth a call to see if yours does. If you don't know where your local health department is, you can check in your phone book. The CDC also has a tool to help you locate your state's health department, and many state websites also have listings of all of their local health departments.  3) Find a [facility] If your illness is not likely to be  very severe or complicated, you may want to try a walk in clinic. These are popping up all over the country in pharmacies, drugstores, and shopping centers. They're usually staffed by nurse practitioners or physician assistants that have been trained to treat common illnesses and complaints. They're usually fairly quick and inexpensive. However, if you have serious medical issues or chronic medical problems, these are probably not your best option.  No Primary Care Doctor: Call [facility] Connect at  [phone] - they can help you locate a primary care doctor that  accepts your  insurance, provides certain services, etc. Physician Referral Service- [phone]  Chronic Pain Problems: Organization         Address  Phone   Notes  [facility]  [phone] Patients need to be referred by their primary care doctor.   Medication Assistance: Organization         Address  Phone   Notes  [facility] Medication [facility] [address] [place], [place] [address] [phone] --Must be a resident of [facility] -- Must have NO insurance coverage whatsoever (no Medicaid/ Medicare, etc.) -- The pt. MUST have a primary care doctor that directs their care regularly and follows them in the community   [facility]  [phone]   [employer]  [phone]    Agencies that provide inexpensive medical care: Organization         Address  Phone   Notes  [place]  [phone]   [facility]    [phone]   [facility] [place], [place] [address] [phone]   [facility] [address] [place] [phone]   [facility]    [phone]   [facility]    [phone]   [facility] [address], [place] Phone:  [phone], Fax:  [phone] Hours of Operation:  9 am - 6 pm, M-F.  Also accepts Medicaid/Medicare and self-pay.  [facility] for [place], [address], [place] Phone: [phone], Fax: [phone]. Hours of Operation:  8:30 am - 5:30 pm, M-F.  Also accepts Medicaid and self-pay.  [facility] [address], [place] Phone: [phone]   [place], [place], [place] [phone], Ext. [phone] Mondays & Thursdays: 7-9 AM.  First 15 patients are seen on a first come, first serve basis.    [place] Providers:  Organization         Address  Phone    Notes  [facility] [address], [place] [phone] Also accepts self-pay patients.  [facility] [address], [place]  [phone]   [place], [address], [place] [phone]   [facility] [address] [place] [phone]   Lucianne Lei [address], [place]   [phone] Only accepts [place] patients after they have their name applied to their card.   Self-Pay (no insurance) in [facility]:  Organization         Address  Phone   Notes  Sickle Cell Patients, [facility] [place] [phone]   [facility] [address] [phone]   [facility] [place]  [place], [place], [place] [phone]   [facility]/Dr. Osei-Bonsu  [address] [place] or [address], [place] [phone] Phone number for both [place] and [place] locations is the same.  [facility] and [facility] [address] [place] [phone]   [facility] [address], [place] or [address] [phone]   [facility] [address] [place] [phone], phone; [phone], fax Sees patients 1st and 3rd Saturday of every month.  Must not qualify for public or private insurance (i.e. Medicaid, Medicare, [place] Health Choice, Veterans' Benefits)  Household income should be no more than 200% of the poverty level The clinic cannot treat you if you are pregnant or think you are pregnant  Sexually transmitted diseases are not treated at the clinic.

## 2018-04-20 NOTE — Progress Notes (Signed)
Orthopedic Tech Progress Note Patient Details:  Victoria Clayton 07-30-1980 [id number]   Ortho Devices Type of Ortho Device: Crutches, ASO Ortho Device/Splint Interventions: Ordered, Application, Adjustment   Post Interventions Patient Tolerated: Well Instructions Provided: Adjustment of device, Care of device   [name] J [name] 04/20/2018, 5:21 PM

## 2018-04-20 NOTE — ED Provider Notes (Signed)
[facility] EMERGENCY DEPARTMENT Provider Note   CSN: [id number] Arrival date & time: 04/20/18  1349    History   Chief Complaint Chief Complaint  Patient presents with  . Ankle Pain    HPI Victoria Clayton is a 38 y.o. female with a PMH of anemia, anxiety, and depression presenting with right lateral ankle pain onset yesterday at 7:30pm after an injury. Patient reports she tripped on a step at the grocery store and inverted her right ankle. Patient reports she fell on her side, but denies hitting her head or LOC. Patient states she has tried ibuprofen without relief. Patient states she has fractured her right ankle in the past, but denies any previous surgeries. Patient describes pain as a burning sensation. Patient states she cannot ambulate due to the pain. Patient reports edema and intermittent paresthesias. Patient reports weakness due to pain. Patient      HPI  Past Medical History:  Diagnosis Date  . Abnormal Pap smear   . Anemia   . Anxiety   . Asthma   . Depression   . Fibroid   . Infection    UTI  . Ovarian cyst   . 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 Vaginal Pap smear, abnormal     Patient Active Problem List   Diagnosis Date Noted  . Cocaine use 12/21/2014  . Left inguinal hernia 01/28/2013    Past Surgical History:  Procedure Laterality Date  . CESAREAN SECTION    . DILATION AND CURETTAGE OF UTERUS    . HERNIA REPAIR    . TUBAL LIGATION       OB History    Gravida  7   Para  3   Term  2   Preterm  1   AB  4   Living  3     SAB  3   TAB  1   Ectopic      Multiple      Live Births  3            Home Medications    Prior to Admission medications   Medication Sig Start Date End Date Taking? Authorizing Provider  albuterol (PROVENTIL HFA;VENTOLIN HFA) 108 (90 Base) MCG/ACT inhaler Inhale 1-2 puffs into the lungs every 6 (six) hours as needed for  wheezing or shortness of breath. 01/11/18   Darlin Drop P, PA-C  amoxicillin-clavulanate (AUGMENTIN) 875-125 MG tablet Take 1 tablet by mouth 2 (two) times daily. One po bid x 7 days Patient taking differently: Take 1 tablet by mouth 2 (two) times daily. For 7 days 01/08/18   Sherwood Gambler, MD  azithromycin (ZITHROMAX) 250 MG tablet Take 1 tablet (250 mg total) by mouth daily. Take first 2 tablets together, then 1 every day until finished. 01/11/18   Darlin Drop P, PA-C  cyclobenzaprine (FLEXERIL) 10 MG tablet Take 1 tablet (10 mg total) by mouth at bedtime. Patient not taking: Reported on 01/11/2018 11/16/17   [place], [place], PA-C  HYDROcodone-acetaminophen (NORCO/VICODIN) 5-325 MG tablet Take 1 tablet by mouth every 6 (six) hours as needed for severe pain. 04/20/18   Darlin Drop P, PA-C  ibuprofen (ADVIL,MOTRIN) 200 MG tablet Take 800 mg by mouth every 6 (six) hours as needed for headache or mild pain.    [provider]  ondansetron (ZOFRAN ODT) 4 MG disintegrating tablet Take 1 tablet (4 mg total) by mouth every 8 (eight) hours  as needed for nausea or vomiting. Patient not taking: Reported on 01/11/2018 01/08/18   Sherwood Gambler, MD  oseltamivir (TAMIFLU) 75 MG capsule Take 1 capsule (75 mg total) by mouth every 12 (twelve) hours. 01/08/18   Sherwood Gambler, MD    Family History Family History  Problem Relation Age of Onset  . Heart disease Mother        chf  . Hypertension Mother   . Fibromyalgia Mother   . COPD Mother   . Alcohol abuse Father   . Anesthesia problems Neg Hx     Social History Social History   Tobacco Use  . Smoking status: Current Every Day Smoker    Packs/day: 1.00    Years: 15.00    Pack years: 15.00    Types: Cigarettes  . Smokeless tobacco: Never Used  Substance Use Topics  . Alcohol use: Yes    Comment: social  . Drug use: Yes    Frequency: 3.0 times per week    Types: Marijuana     Allergies   Amoxicillin and Penicillins   Review of  Systems Review of Systems  Constitutional: Negative for chills, diaphoresis and fever.  Respiratory: Negative for cough and shortness of breath.   Cardiovascular: Negative for chest pain.  Gastrointestinal: Negative for abdominal pain, nausea and vomiting.  Musculoskeletal: Positive for arthralgias, gait problem and joint swelling. Negative for back pain.  Skin: Negative for rash and wound.  Allergic/Immunologic: Negative for immunocompromised state.  Neurological: Positive for weakness and numbness.  Hematological: Does not bruise/bleed easily.    Physical Exam Updated Vital Signs BP (!) 157/104 (BP Location: Right Arm)   Pulse 93   Temp 97.6 F (36.4 C) (Oral)   Resp 18   SpO2 98%   Physical Exam Vitals signs and nursing note reviewed.  Constitutional:      General: She is not in acute distress.    Appearance: She is well-developed. She is not diaphoretic.  HENT:     Head: Normocephalic and atraumatic.  Neck:     Musculoskeletal: Normal range of motion.  Cardiovascular:     Rate and Rhythm: Normal rate and regular rhythm.     Heart sounds: Normal heart sounds. No murmur. No friction rub. No gallop.   Pulmonary:     Effort: Pulmonary effort is normal. No respiratory distress.     Breath sounds: Normal breath sounds. No wheezing or rales.  Abdominal:     Palpations: Abdomen is soft.     Tenderness: There is no abdominal tenderness.  Musculoskeletal:     Right knee: Normal. She exhibits normal range of motion, no swelling and no effusion. No tenderness found. No medial joint line, no lateral joint line, no MCL, no LCL and no patellar tendon tenderness noted.     Right ankle: She exhibits decreased range of motion and swelling. She exhibits no laceration. Tenderness. Lateral malleolus, medial malleolus and head of 5th metatarsal tenderness found. No AITFL, no CF ligament, no posterior TFL and no proximal fibula tenderness found. Achilles tendon exhibits no pain.     Left  ankle: Normal. She exhibits normal range of motion, no swelling and no ecchymosis. No tenderness. No lateral malleolus, no medial malleolus, no AITFL, no CF ligament, no posterior TFL, no head of 5th metatarsal and no proximal fibula tenderness found.     Comments: Edema noted over lateral aspect of right ankle. Tenderness upon palpation of medial and lateral malleolus. Tenderness to palpation of the head of the  5th metatarsal of the right foot. Decreased ROM and strength of right ankle and foot due to pain. Sensation intact. 2+ DP pulses. Patient is unable to apply pressure on right foot or ankle.   Skin:    General: Skin is warm.     Findings: No erythema or rash.  Neurological:     Mental Status: She is alert.    ED Treatments / Results  Labs (all labs ordered are listed, but only abnormal results are displayed) Labs Reviewed - No data to display  EKG None  Radiology Dg Ankle Complete Right  Result Date: 04/20/2018 CLINICAL DATA:  Thirty-nine EXAM: RIGHT ANKLE - COMPLETE 3+ VIEW COMPARISON:  None. FINDINGS: Irregular appearance of the cortex of the medial malleolus on two views. Ankle mortise appears congruent. Significant soft tissue swelling at the ankle with joint effusion on the lateral view. IMPRESSION: Suspected chip fracture at the medial malleolus with associated significant soft tissue swelling and joint effusion. Electronically Signed   By: Corrie Mckusick D.O.   On: 04/20/2018 15:12   Dg Foot Complete Right  Result Date: 04/20/2018 CLINICAL DATA:  38 year old female with a history of foot pain after a fall EXAM: RIGHT FOOT COMPLETE - 3+ VIEW COMPARISON:  None. FINDINGS: Irregular lucency at the distal fibula, with associated soft tissue swelling at the ankle and joint effusion. No radiopaque foreign body. No subluxation/dislocation. No significant degenerative changes of the forefoot or midfoot. IMPRESSION: Questionable nondisplaced fracture at the distal fibula, in addition to  the irregularity at the medial malleolus. Soft tissue swelling and joint effusion. Electronically Signed   By: Corrie Mckusick D.O.   On: 04/20/2018 16:02   Procedures Procedures (including critical care time)  Medications Ordered in ED Medications  ketorolac (TORADOL) 30 MG/ML injection 30 mg (30 mg Intramuscular Given 04/20/18 1607)     Initial Impression / Assessment and Plan / ED Course  I have reviewed the triage vital signs and the nursing notes.  Pertinent labs & imaging results that were available during my care of the patient were reviewed by me and considered in my medical decision making (see chart for details).  Clinical Course as of Apr 19 1633  Fri Apr 20, 2018  1515 X ray of right ankle reveals a suspected chip fracture at the medial malleolus with associated significant soft tissue swelling and joint effusion.    DG Ankle Complete Right [AH]  1610 Foot x ray reveals questionable nondisplaced fracture at the distal fibula and an irregularity at the medial malleolus.    DG Foot Complete Right [AH]    Clinical Course User Index [AH] Arville Lime, PA-C      Patient presents with right ankle and right foot pain after a fall. Patient X-Ray of right ankle reveals a suspected chip fracture at the medial malleolus. X ray of the right foot reveals a questionable nondisplaced fracture at the distal fibula without other acute changes. Pain managed in ED. Patient given Toradol, crutches, and brace while in ED. Conservative therapy recommended and discussed. Pt advised to follow up with PCP. Patient insists pain is severe and other medications do not alleviate her pain. Will prescribe a very short course of pain medicine. Advised patient not to drive while taking this medication and follow up with PCP. Discussed elevated BP during today's visit. Patient denies any symptoms of hypertensive crisis. Advised patient to follow up with PCP. Patient will be dc home & is agreeable with  above plan. I have also  discussed reasons to return immediately to the ER.    Findings and plan of care discussed with supervising physician Dr. Tyrone Nine.  Final Clinical Impressions(s) / ED Diagnoses   Final diagnoses:  Acute right ankle pain  Right foot pain    ED Discharge Orders         Ordered    HYDROcodone-acetaminophen (NORCO/VICODIN) 5-325 MG tablet  Every 6 hours PRN     04/20/18 1634           Arville Lime, PA-C 04/20/18 [place], [place], DO 04/20/18 1725

## 2018-04-20 NOTE — ED Triage Notes (Signed)
Pt fell yesterday injuring her right ankle. Swelling noted.

## 2018-09-14 ENCOUNTER — Emergency Department (HOSPITAL_COMMUNITY): Payer: Self-pay

## 2018-09-14 ENCOUNTER — Encounter (HOSPITAL_COMMUNITY): Payer: Self-pay | Admitting: Emergency Medicine

## 2018-09-14 ENCOUNTER — Emergency Department (HOSPITAL_COMMUNITY)
Admission: EM | Admit: 2018-09-14 | Discharge: 2018-09-14 | Disposition: A | Discharge status: Home / Self Care | Payer: Self-pay | Attending: Emergency Medicine | Admitting: Emergency Medicine

## 2018-09-14 ENCOUNTER — Ambulatory Visit (HOSPITAL_COMMUNITY)
Admission: EM | Admit: 2018-09-14 | Discharge: 2018-09-14 | Disposition: A | Discharge status: Home / Self Care | Payer: Self-pay

## 2018-09-14 ENCOUNTER — Other Ambulatory Visit: Payer: Self-pay

## 2018-09-14 DIAGNOSIS — R079 Chest pain, unspecified: Secondary | ICD-10-CM | POA: Insufficient documentation

## 2018-09-14 DIAGNOSIS — Z5321 Procedure and treatment not carried out due to patient leaving prior to being seen by health care provider: Secondary | ICD-10-CM | POA: Insufficient documentation

## 2018-09-14 LAB — CBC
HCT: 42.4 % (ref 36.0–46.0)
Hemoglobin: 13.7 g/dL (ref 12.0–15.0)
MCH: 29.7 pg (ref 26.0–34.0)
MCHC: 32.3 g/dL (ref 30.0–36.0)
MCV: 92 fL (ref 80.0–100.0)
Platelets: 355 10*3/uL (ref 150–400)
RBC: 4.61 MIL/uL (ref 3.87–5.11)
RDW: 14.4 % (ref 11.5–15.5)
WBC: 10 10*3/uL (ref 4.0–10.5)
nRBC: 0 % (ref 0.0–0.2)

## 2018-09-14 LAB — BASIC METABOLIC PANEL
Anion gap: 10 (ref 5–15)
BUN: 6 mg/dL (ref 6–20)
CO2: 24 mmol/L (ref 22–32)
Calcium: 9 mg/dL (ref 8.9–10.3)
Chloride: 105 mmol/L (ref 98–111)
Creatinine, Ser: 0.77 mg/dL (ref 0.44–1.00)
GFR calc Af Amer: >60 mL/min (ref >60)
GFR calc non Af Amer: >60 mL/min (ref >60)
Glucose, Bld: 91 mg/dL (ref 70–99)
Potassium: 3.8 mmol/L (ref 3.5–5.1)
Sodium: 139 mmol/L (ref 135–145)

## 2018-09-14 LAB — I-STAT BETA HCG BLOOD, ED (MC, WL, AP ONLY): I-stat hCG, quantitative: <5 m[IU]/mL (ref <5)

## 2018-09-14 LAB — TROPONIN I (HIGH SENSITIVITY): Troponin I (High Sensitivity): 9 ng/L (ref <18)

## 2018-09-14 MED ORDER — SODIUM CHLORIDE 0.9% FLUSH: 3.0000 mL | Freq: Once | INTRAVENOUS | Status: DC

## 2018-09-14 NOTE — ED Notes (Signed)
Pt transferred to ER by nursing staff.

## 2018-09-14 NOTE — ED Notes (Signed)
Patient is being discharged from the [facility] and sent to the Emergency Department via wheelchair by staff. Patient is stable but in need of higher level of care due to CP, SOB, HTN. Patient is aware and verbalizes understanding of plan of care. There were no vitals filed for this visit.

## 2018-09-14 NOTE — ED Triage Notes (Addendum)
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

## 2018-09-14 NOTE — ED Notes (Signed)
Pt states she can't wait any longer. Instructed patient to follow up about her blood pressure. Left ED with steady gait.

## 2018-10-10 ENCOUNTER — Ambulatory Visit
Admission: EM | Admit: 2018-10-10 | Discharge: 2018-10-10 | Disposition: A | Discharge status: Home / Self Care | Payer: Self-pay | Attending: Physician Assistant | Admitting: Physician Assistant

## 2018-10-10 DIAGNOSIS — J3489 Other specified disorders of nose and nasal sinuses: Secondary | ICD-10-CM

## 2018-10-10 DIAGNOSIS — R0981 Nasal congestion: Secondary | ICD-10-CM

## 2018-10-10 DIAGNOSIS — Z1159 Encounter for screening for other viral diseases: Secondary | ICD-10-CM

## 2018-10-10 MED ORDER — PREDNISONE 50 MG PO TABS: 50.0000 mg | ORAL_TABLET | Freq: Every day | ORAL | 0 refills | Status: DC

## 2018-10-10 MED ORDER — KETOROLAC TROMETHAMINE 30 MG/ML IJ SOLN
30.0000 mg | Freq: Once | INTRAMUSCULAR | Status: AC
  Administered 2018-10-10: 30 mg via INTRAMUSCULAR

## 2018-10-10 MED ORDER — FLUTICASONE PROPIONATE 50 MCG/ACT NA SUSP: 2.0000 | Freq: Every day | NASAL | 0 refills | Status: DC

## 2018-10-10 NOTE — Discharge Instructions (Signed)
As discussed, cannot rule out COVID. Currently, no alarming signs. Testing ordered. I would like you to quarantine until testing results. Toradol injection in office today. Prednisone and flonase as directed for symptomatic relief. If experiencing shortness of breath, trouble breathing, call 911 and provide them with your current situation.

## 2018-10-10 NOTE — ED Provider Notes (Signed)
[facility]    CSN: [id number] Arrival date & time: 10/10/18  1808      History   Chief Complaint Chief Complaint  Patient presents with  . Headache    HPI Victoria Clayton is a 38 y.o. female.   38 year old female comes in for 3 day history of URI symptoms, headache.  She has had nasal congestion, left sinus pressure.  States has coughing at baseline, no worsening.  Denies fever, chills, body aches.  Denies abdominal pain, nausea, vomiting, diarrhea.  States headache/sinus pressure has caused mild photophobia, no phonophobia.  Denies weakness, dizziness, syncope.  Denies ear pain, vision changes.  Denies shortness of breath, loss of taste or smell.  Has been taking OTC cold medication without much relief.  Current everyday smoker.     Past Medical History:  Diagnosis Date  . Abnormal Pap smear   . Anemia   . Anxiety   . Asthma   . Depression   . Fibroid   . Infection    UTI  . Ovarian cyst   . 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 Vaginal Pap smear, abnormal     Patient Active Problem List   Diagnosis Date Noted  . Cocaine use 12/21/2014  . Left inguinal hernia 01/28/2013    Past Surgical History:  Procedure Laterality Date  . CESAREAN SECTION    . DILATION AND CURETTAGE OF UTERUS    . HERNIA REPAIR    . TUBAL LIGATION      OB History    Gravida  7   Para  3   Term  2   Preterm  1   AB  4   Living  3     SAB  3   TAB  1   Ectopic      Multiple      Live Births  3            Home Medications    Prior to Admission medications   Medication Sig Start Date End Date Taking? Authorizing Provider  albuterol (PROVENTIL HFA;VENTOLIN HFA) 108 (90 Base) MCG/ACT inhaler Inhale 1-2 puffs into the lungs every 6 (six) hours as needed for wheezing or shortness of breath. 01/11/18   Darlin Drop P, PA-C  amoxicillin-clavulanate (AUGMENTIN) 875-125 MG tablet Take 1 tablet by mouth  2 (two) times daily. One po bid x 7 days Patient taking differently: Take 1 tablet by mouth 2 (two) times daily. For 7 days 01/08/18   Sherwood Gambler, MD  cyclobenzaprine (FLEXERIL) 10 MG tablet Take 1 tablet (10 mg total) by mouth at bedtime. Patient not taking: Reported on 01/11/2018 11/16/17   Wurst, [place], PA-C  fluticasone [facility]) 50 MCG/ACT nasal spray Place 2 sprays into both nostrils daily. 10/10/18   Tasia Catchings,  V, PA-C  ibuprofen (ADVIL,MOTRIN) 200 MG tablet Take 800 mg by mouth every 6 (six) hours as needed for headache or mild pain.    [provider]  ondansetron (ZOFRAN ODT) 4 MG disintegrating tablet Take 1 tablet (4 mg total) by mouth every 8 (eight) hours as needed for nausea or vomiting. Patient not taking: Reported on 01/11/2018 01/08/18   Sherwood Gambler, MD  predniSONE (DELTASONE) 50 MG tablet Take 1 tablet (50 mg total) by mouth daily with breakfast. 10/10/18   Ok Edwards, PA-C    Family History Family History  Problem Relation Age of Onset  . Heart disease  Mother        chf  . Hypertension Mother   . Fibromyalgia Mother   . COPD Mother   . Alcohol abuse Father   . Anesthesia problems Neg Hx     Social History Social History   Tobacco Use  . Smoking status: Current Every Day Smoker    Packs/day: 1.00    Years: 15.00    Pack years: 15.00    Types: Cigarettes  . Smokeless tobacco: Never Used  Substance Use Topics  . Alcohol use: Yes    Comment: social  . Drug use: Yes    Frequency: 3.0 times per week    Types: Marijuana     Allergies   Amoxicillin and Penicillins   Review of Systems Review of Systems  Reason unable to perform ROS: See HPI as above.     Physical Exam Triage Vital Signs ED Triage Vitals [10/10/18 1819]  Enc Vitals Group     BP (!) 164/121     Pulse Rate 91     Resp 20     Temp 99.4 F (37.4 C)     Temp Source Oral     SpO2 96 %     Weight      Height      Head Circumference      Peak Flow      Pain Score 8     Pain  Loc      Pain Edu?      Excl. in [place]?    No data found.  Updated Vital Signs BP (!) 164/121 (BP Location: Left Arm)   Pulse 91   Temp 99.4 F (37.4 C) (Oral)   Resp 20   LMP 09/07/2018   SpO2 96%   Physical Exam Constitutional:      General: She is not in acute distress.    Appearance: Normal appearance. She is not ill-appearing, toxic-appearing or diaphoretic.  HENT:     Head: Normocephalic and atraumatic.     Right Ear: Tympanic membrane, ear canal and external ear normal. Tympanic membrane is not erythematous or bulging.     Left Ear: Tympanic membrane, ear canal and external ear normal. Tympanic membrane is not erythematous or bulging.     Nose:     Right Sinus: No maxillary sinus tenderness or frontal sinus tenderness.     Left Sinus: Maxillary sinus tenderness and frontal sinus tenderness present.     Mouth/Throat:     Mouth: Mucous membranes are moist.     Pharynx: Oropharynx is clear. Uvula midline.  Eyes:     Extraocular Movements: Extraocular movements intact.     Conjunctiva/sclera: Conjunctivae normal.     Pupils: Pupils are equal, round, and reactive to light.     Comments: No photophobia on exam.  Neck:     Musculoskeletal: Normal range of motion and neck supple.  Cardiovascular:     Rate and Rhythm: Normal rate and regular rhythm.     Heart sounds: Normal heart sounds. No murmur. No friction rub. No gallop.   Pulmonary:     Effort: Pulmonary effort is normal. No accessory muscle usage, prolonged expiration, respiratory distress or retractions.     Comments: Lungs clear to auscultation without adventitious lung sounds. Neurological:     General: No focal deficit present.     Mental Status: She is alert and oriented to person, place, and time.     GCS: GCS eye subscore is 4. GCS verbal subscore is 5. GCS  motor subscore is 6.     Comments: Able to ambulate on own without difficulty.       UC Treatments / Results  Labs (all labs ordered are listed, but  only abnormal results are displayed) Labs Reviewed  NOVEL CORONAVIRUS, NAA    EKG   Radiology No results found.  Procedures Procedures (including critical care time)  Medications Ordered in UC Medications  ketorolac (TORADOL) 30 MG/ML injection 30 mg (30 mg Intramuscular Given 10/10/18 1919)    Initial Impression / Assessment and Plan / UC Course  I have reviewed the triage vital signs and the nursing notes.  Pertinent labs & imaging results that were available during my care of the patient were reviewed by me and considered in my medical decision making (see chart for details).    Discussed cannot rule out COVID as cause of symptoms, will send out for COVID testing.  Patient to remain in quarantine until testing results return.  Provide Toradol injection in office today for symptomatic relief.  Start prednisone and Flonase for symptomatic treatment as well.  Push fluids.  Return precautions given.  Patient expresses understanding and agrees to plan.  Final Clinical Impressions(s) / UC Diagnoses   Final diagnoses:  Nasal congestion  Sinus pressure   ED Prescriptions    Medication Sig Dispense Auth. Provider   predniSONE (DELTASONE) 50 MG tablet Take 1 tablet (50 mg total) by mouth daily with breakfast. 5 tablet ,  V, PA-C   fluticasone (FLONASE) 50 MCG/ACT nasal spray Place 2 sprays into both nostrils daily. 1 g Ok Edwards, PA-C     PDMP not reviewed this encounter.   Ok Edwards, PA-C 10/10/18 1935

## 2018-10-10 NOTE — ED Triage Notes (Signed)
Pt c/o sinus pressure, nasal congestion and sever headache over lt eye. Pt is HTN on arrival, states is not on medication.

## 2018-10-13 LAB — NOVEL CORONAVIRUS, NAA: SARS-CoV-2, NAA: NOT DETECTED

## 2019-03-04 ENCOUNTER — Other Ambulatory Visit: Payer: Self-pay

## 2019-03-04 ENCOUNTER — Ambulatory Visit (HOSPITAL_COMMUNITY)
Admission: EM | Admit: 2019-03-04 | Discharge: 2019-03-04 | Disposition: A | Discharge status: Home / Self Care | Payer: Self-pay | Attending: Family Medicine | Admitting: Family Medicine

## 2019-03-04 ENCOUNTER — Encounter (HOSPITAL_COMMUNITY): Payer: Self-pay

## 2019-03-04 DIAGNOSIS — N939 Abnormal uterine and vaginal bleeding, unspecified: Secondary | ICD-10-CM | POA: Insufficient documentation

## 2019-03-04 DIAGNOSIS — Z113 Encounter for screening for infections with a predominantly sexual mode of transmission: Secondary | ICD-10-CM | POA: Insufficient documentation

## 2019-03-04 DIAGNOSIS — R030 Elevated blood-pressure reading, without diagnosis of hypertension: Secondary | ICD-10-CM | POA: Insufficient documentation

## 2019-03-04 LAB — POCT URINALYSIS DIP (DEVICE)
Bilirubin Urine: NEGATIVE
Glucose, UA: NEGATIVE mg/dL
Ketones, ur: NEGATIVE mg/dL
Leukocytes,Ua: NEGATIVE
Nitrite: NEGATIVE
Protein, ur: NEGATIVE mg/dL
Specific Gravity, Urine: 1.015 (ref 1.005–1.030)
Urobilinogen, UA: 0.2 mg/dL (ref 0.0–1.0)
pH: 7 (ref 5.0–8.0)

## 2019-03-04 NOTE — ED Triage Notes (Addendum)
Pt is here with vaginal bleeding that started 3 days ago, states she was on her period on 2/17-2/25. Pt has not taken any meds to relieve discomfort.

## 2019-03-04 NOTE — ED Provider Notes (Signed)
[place]    CSN: [id number] Arrival date & time: 03/04/19  1541      History   Chief Complaint Chief Complaint  Patient presents with  . Vaginal Bleeding    HPI Victoria Clayton is a 39 y.o. female.   Reports that she is experiencing irregular vaginal bleeding. Reports that she is seeing some pinkish brown blood when wiping. Reports that she normally has a period for about 5-7 days, and that she had a period 2/17-2/25/21. Hx significant for ovarian cysts with rupture, STD per patient (cannot recall which one). Denies vaginal discharge, pain with sex, fever, n/v/d, ST, HA, SOB, rash, other symptoms.   ROS per HPI  The history is provided by the patient.    Past Medical History:  Diagnosis Date  . Abnormal Pap smear   . Anemia   . Anxiety   . Asthma   . Depression   . Fibroid   . Infection    UTI  . Ovarian cyst   . 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 Vaginal Pap smear, abnormal     Patient Active Problem List   Diagnosis Date Noted  . Cocaine use 12/21/2014  . Left inguinal hernia 01/28/2013    Past Surgical History:  Procedure Laterality Date  . CESAREAN SECTION    . DILATION AND CURETTAGE OF UTERUS    . HERNIA REPAIR    . TUBAL LIGATION      OB History    Gravida  7   Para  3   Term  2   Preterm  1   AB  4   Living  3     SAB  3   TAB  1   Ectopic      Multiple      Live Births  3            Home Medications    Prior to Admission medications   Medication Sig Start Date End Date Taking? Authorizing Provider  albuterol (PROVENTIL HFA;VENTOLIN HFA) 108 (90 Base) MCG/ACT inhaler Inhale 1-2 puffs into the lungs every 6 (six) hours as needed for wheezing or shortness of breath. 01/11/18   Darlin Drop P, PA-C  amoxicillin-clavulanate (AUGMENTIN) 875-125 MG tablet Take 1 tablet by mouth 2 (two) times daily. One po bid x 7 days Patient taking differently: Take 1  tablet by mouth 2 (two) times daily. For 7 days 01/08/18   Sherwood Gambler, MD  cyclobenzaprine (FLEXERIL) 10 MG tablet Take 1 tablet (10 mg total) by mouth at bedtime. Patient not taking: Reported on 01/11/2018 11/16/17   Wurst, [place], PA-C  fluticasone [facility]) 50 MCG/ACT nasal spray Place 2 sprays into both nostrils daily. 10/10/18   Tasia Catchings, Amy V, PA-C  ibuprofen (ADVIL,MOTRIN) 200 MG tablet Take 800 mg by mouth every 6 (six) hours as needed for headache or mild pain.    [provider]  ondansetron (ZOFRAN ODT) 4 MG disintegrating tablet Take 1 tablet (4 mg total) by mouth every 8 (eight) hours as needed for nausea or vomiting. Patient not taking: Reported on 01/11/2018 01/08/18   Sherwood Gambler, MD  predniSONE (DELTASONE) 50 MG tablet Take 1 tablet (50 mg total) by mouth daily with breakfast. 10/10/18   Ok Edwards, PA-C    Family History Family History  Problem Relation Age of Onset  . Heart disease Mother        chf  .  Hypertension Mother   . Fibromyalgia Mother   . COPD Mother   . Alcohol abuse Father   . Anesthesia problems Neg Hx     Social History Social History   Tobacco Use  . Smoking status: Current Every Day Smoker    Packs/day: 1.00    Years: 15.00    Pack years: 15.00    Types: Cigarettes  . Smokeless tobacco: Never Used  Substance Use Topics  . Alcohol use: Yes    Comment: social  . Drug use: Yes    Frequency: 3.0 times per week    Types: Marijuana     Allergies   Amoxicillin and Penicillins   Review of Systems Review of Systems   Physical Exam Triage Vital Signs ED Triage Vitals  Enc Vitals Group     BP 03/04/19 1633 (!) 168/111     Pulse Rate 03/04/19 1633 77     Resp 03/04/19 1633 20     Temp 03/04/19 1633 99 F (37.2 C)     Temp Source 03/04/19 1633 Oral     SpO2 03/04/19 1633 98 %     Weight 03/04/19 1630 292 lb 3.2 oz (132.5 kg)     Height --      Head Circumference --      Peak Flow --      Pain Score 03/04/19 1630 0     Pain  Loc --      Pain Edu? --      Excl. in [place]? --    No data found.  Updated Vital Signs BP (!) 168/111 (BP Location: Right Arm) Comment: has does not take her BP meds  Pulse 77   Temp 99 F (37.2 C) (Oral)   Resp 20   Wt 292 lb 3.2 oz (132.5 kg)   LMP 02/20/2019   SpO2 98%   BMI 45.76 kg/m   Visual Acuity Right Eye Distance:   Left Eye Distance:   Bilateral Distance:    Right Eye Near:   Left Eye Near:    Bilateral Near:     Physical Exam Vitals and nursing note reviewed.  Constitutional:      General: She is not in acute distress.    Appearance: She is well-developed. She is obese.  HENT:     Head: Normocephalic and atraumatic.  Eyes:     Conjunctiva/sclera: Conjunctivae normal.  Cardiovascular:     Rate and Rhythm: Normal rate and regular rhythm.     Heart sounds: Normal heart sounds. No murmur.  Pulmonary:     Effort: Pulmonary effort is normal. No respiratory distress.     Breath sounds: Normal breath sounds. No stridor. No wheezing, rhonchi or rales.  Chest:     Chest wall: No tenderness.  Abdominal:     General: Bowel sounds are normal. There is no distension.     Palpations: Abdomen is soft. There is no mass.     Tenderness: There is no abdominal tenderness. There is no right CVA tenderness, left CVA tenderness, guarding or rebound.     Hernia: No hernia is present. There is no hernia in the left inguinal area or right inguinal area.  Genitourinary:    General: Normal vulva.     Exam position: Lithotomy position.     Pubic Area: No rash or pubic lice.      Labia:        Right: No rash, tenderness, lesion or injury.        Left:  No rash, tenderness, lesion or injury.      Urethra: No prolapse, urethral pain, urethral swelling or urethral lesion.     Comments: Cervical os pink, blood appears to be originating here, nontender. Musculoskeletal:        General: Normal range of motion.     Cervical back: Neck supple.  Lymphadenopathy:     Lower Body: No  right inguinal adenopathy. No left inguinal adenopathy.  Skin:    General: Skin is warm and dry.     Capillary Refill: Capillary refill takes less than 2 seconds.  Neurological:     General: No focal deficit present.     Mental Status: She is alert and oriented to person, place, and time.  Psychiatric:        Mood and Affect: Mood normal.        Behavior: Behavior normal.        Thought Content: Thought content normal.      UC Treatments / Results  Labs (all labs ordered are listed, but only abnormal results are displayed) Labs Reviewed  POCT URINALYSIS DIP (DEVICE) - Abnormal; Notable for the following components:      Result Value   Hgb urine dipstick MODERATE (*)    All other components within normal limits  CERVICOVAGINAL ANCILLARY ONLY    EKG   Radiology No results found.  Procedures Procedures (including critical care time)  Medications Ordered in UC Medications - No data to display  Initial Impression / Assessment and Plan / UC Course  I have reviewed the triage vital signs and the nursing notes.  Pertinent labs & imaging results that were available during my care of the patient were reviewed by me and considered in my medical decision making (see chart for details).     Presents with abnormal vaginal bleeding. UA negative for infection, positive for blood in office. Pelvic exam, blood originating from cervical os, nontender, no erythema, swelling. Aptima swab obtained and will send out. Will inform patient of results and treat accordingly. Advised to abstain from sex until results are back and treatment is completed if needed. Addressed pt BP, instructed that she needs to get set up with Primary Care for further evaluation and treatment. Instructed to start BP log and take this to her primary care visit. Instructed to go to the ED for severe vaginal bleeding, high fever, SOB, worst headache, blurry vision, or other concerning symptoms.  Final Clinical  Impressions(s) / UC Diagnoses   Final diagnoses:  Vaginal bleeding  Screen for STD (sexually transmitted disease)  Elevated blood pressure reading     Discharge Instructions     We will test for BV and yeast today.   Elevated blood pressure. Get set up with primary care.   Go to the ER for the worst headache ever, and we will call with results of your swab.      ED Prescriptions    None     I have reviewed the PDMP during this encounter.   Faustino Congress, NP 03/05/19 1009

## 2019-03-04 NOTE — Discharge Instructions (Addendum)
We will test for BV and yeast today.   Elevated blood pressure. Get set up with primary care.   Go to the ER for the worst headache ever, and we will call with results of your swab.

## 2019-03-06 LAB — CERVICOVAGINAL ANCILLARY ONLY
Bacterial vaginitis: POSITIVE — AB
Candida vaginitis: NEGATIVE
Chlamydia: NEGATIVE
Neisseria Gonorrhea: NEGATIVE
Trichomonas: NEGATIVE

## 2019-03-08 ENCOUNTER — Telehealth (HOSPITAL_COMMUNITY): Payer: Self-pay | Admitting: Emergency Medicine

## 2019-03-08 MED ORDER — METRONIDAZOLE 500 MG PO TABS: 500.0000 mg | ORAL_TABLET | Freq: Two times a day (BID) | ORAL | 0 refills | Status: AC

## 2019-03-08 NOTE — Telephone Encounter (Signed)
Bacterial vaginosis is positive. Pt needs treatment. Flagyl 500 mg BID x 7 days #14 no refills sent to patients pharmacy of choice.    Patient contacted by phone and made aware of    results. Pt verbalized understanding and had all questions answered.    

## 2019-03-16 IMAGING — DX DG CHEST 2V
2 series · 2 of 2 positions shown · non-contrast
Comparison: 12/04/2013

CLINICAL DATA: Fell forward onto concrete landing on her chest on
[REDACTED], body aches afterwards, spasms in chest today with squeezing
chest pain, shortness of breath, history asthma

EXAM:
CHEST  2 VIEW

[chest pa]
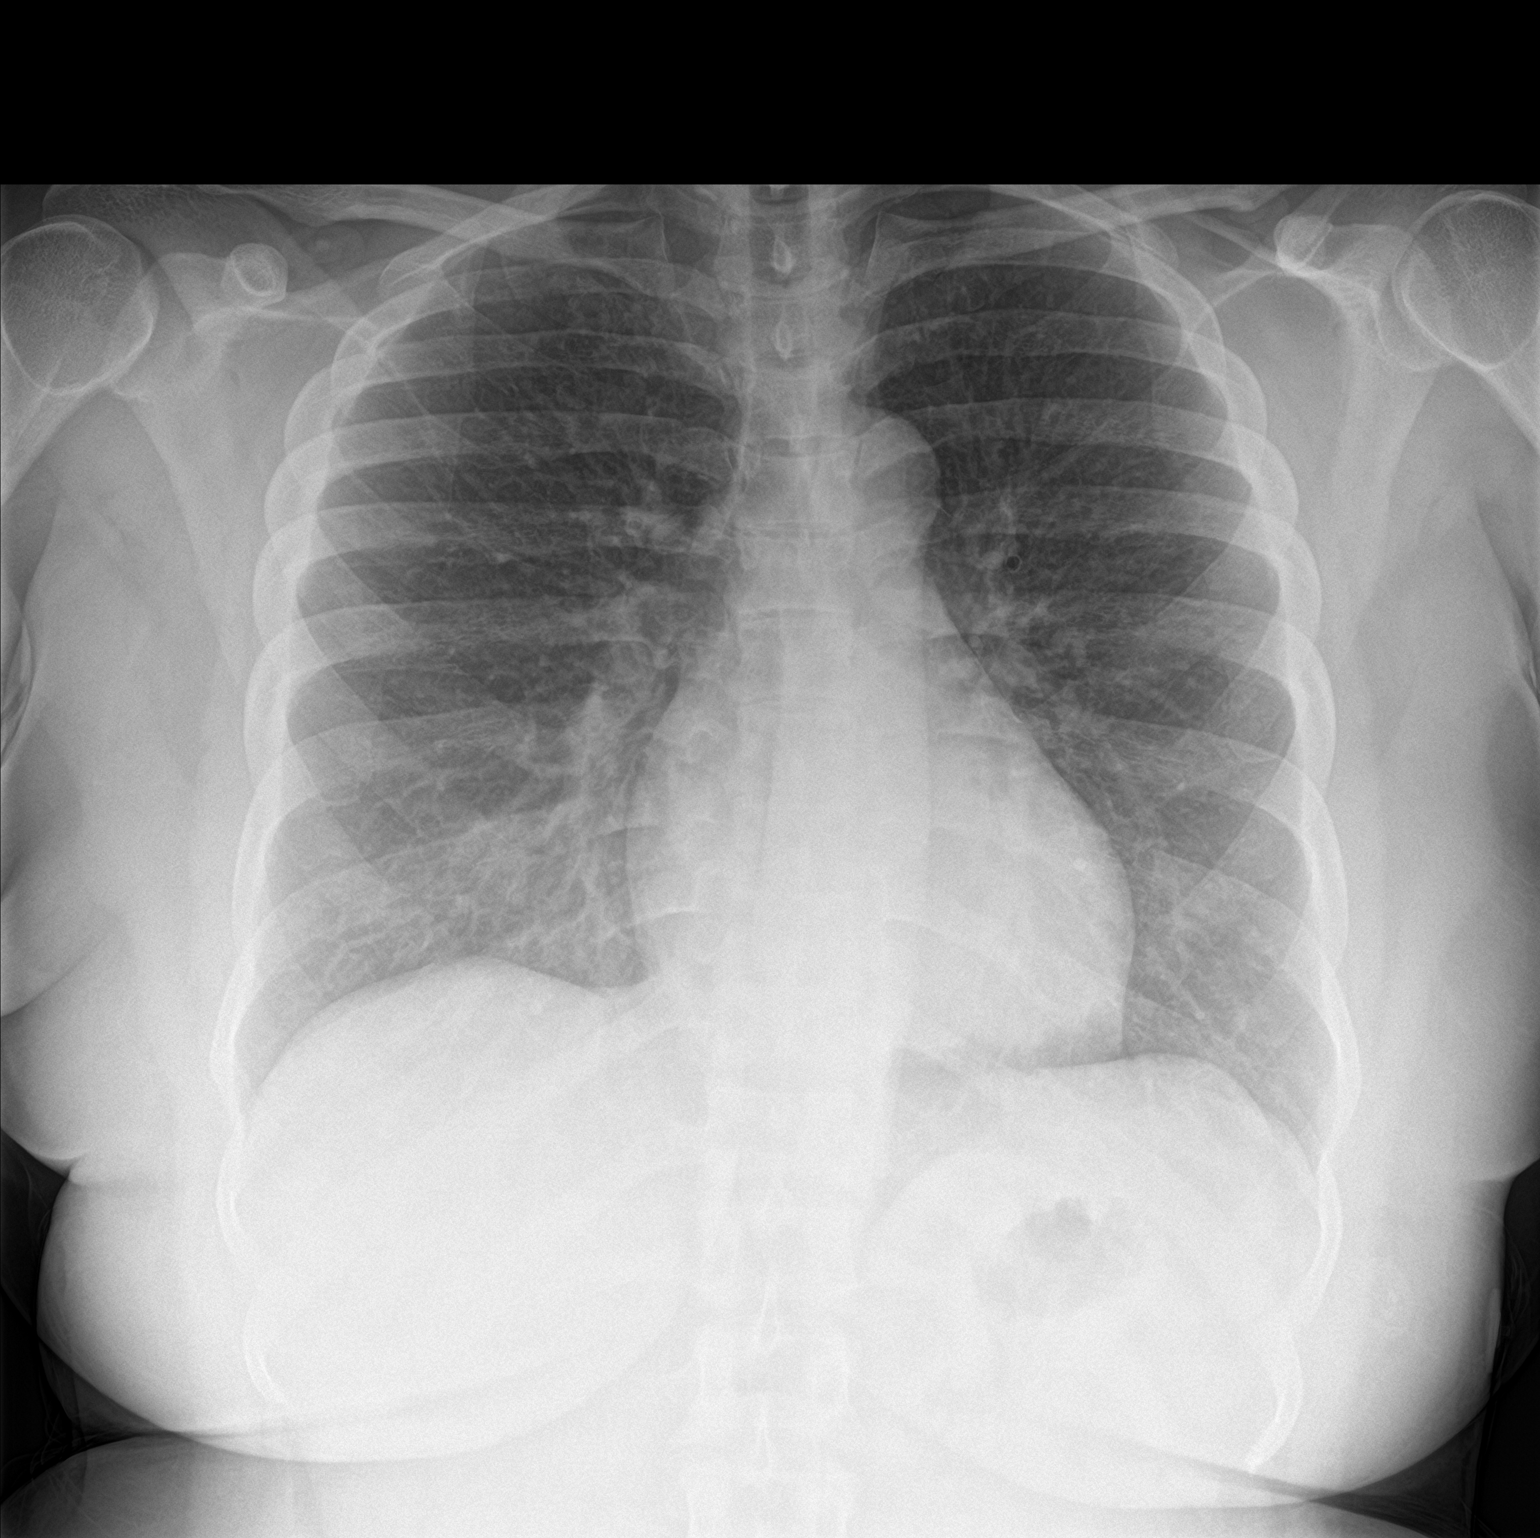

[chest lat]
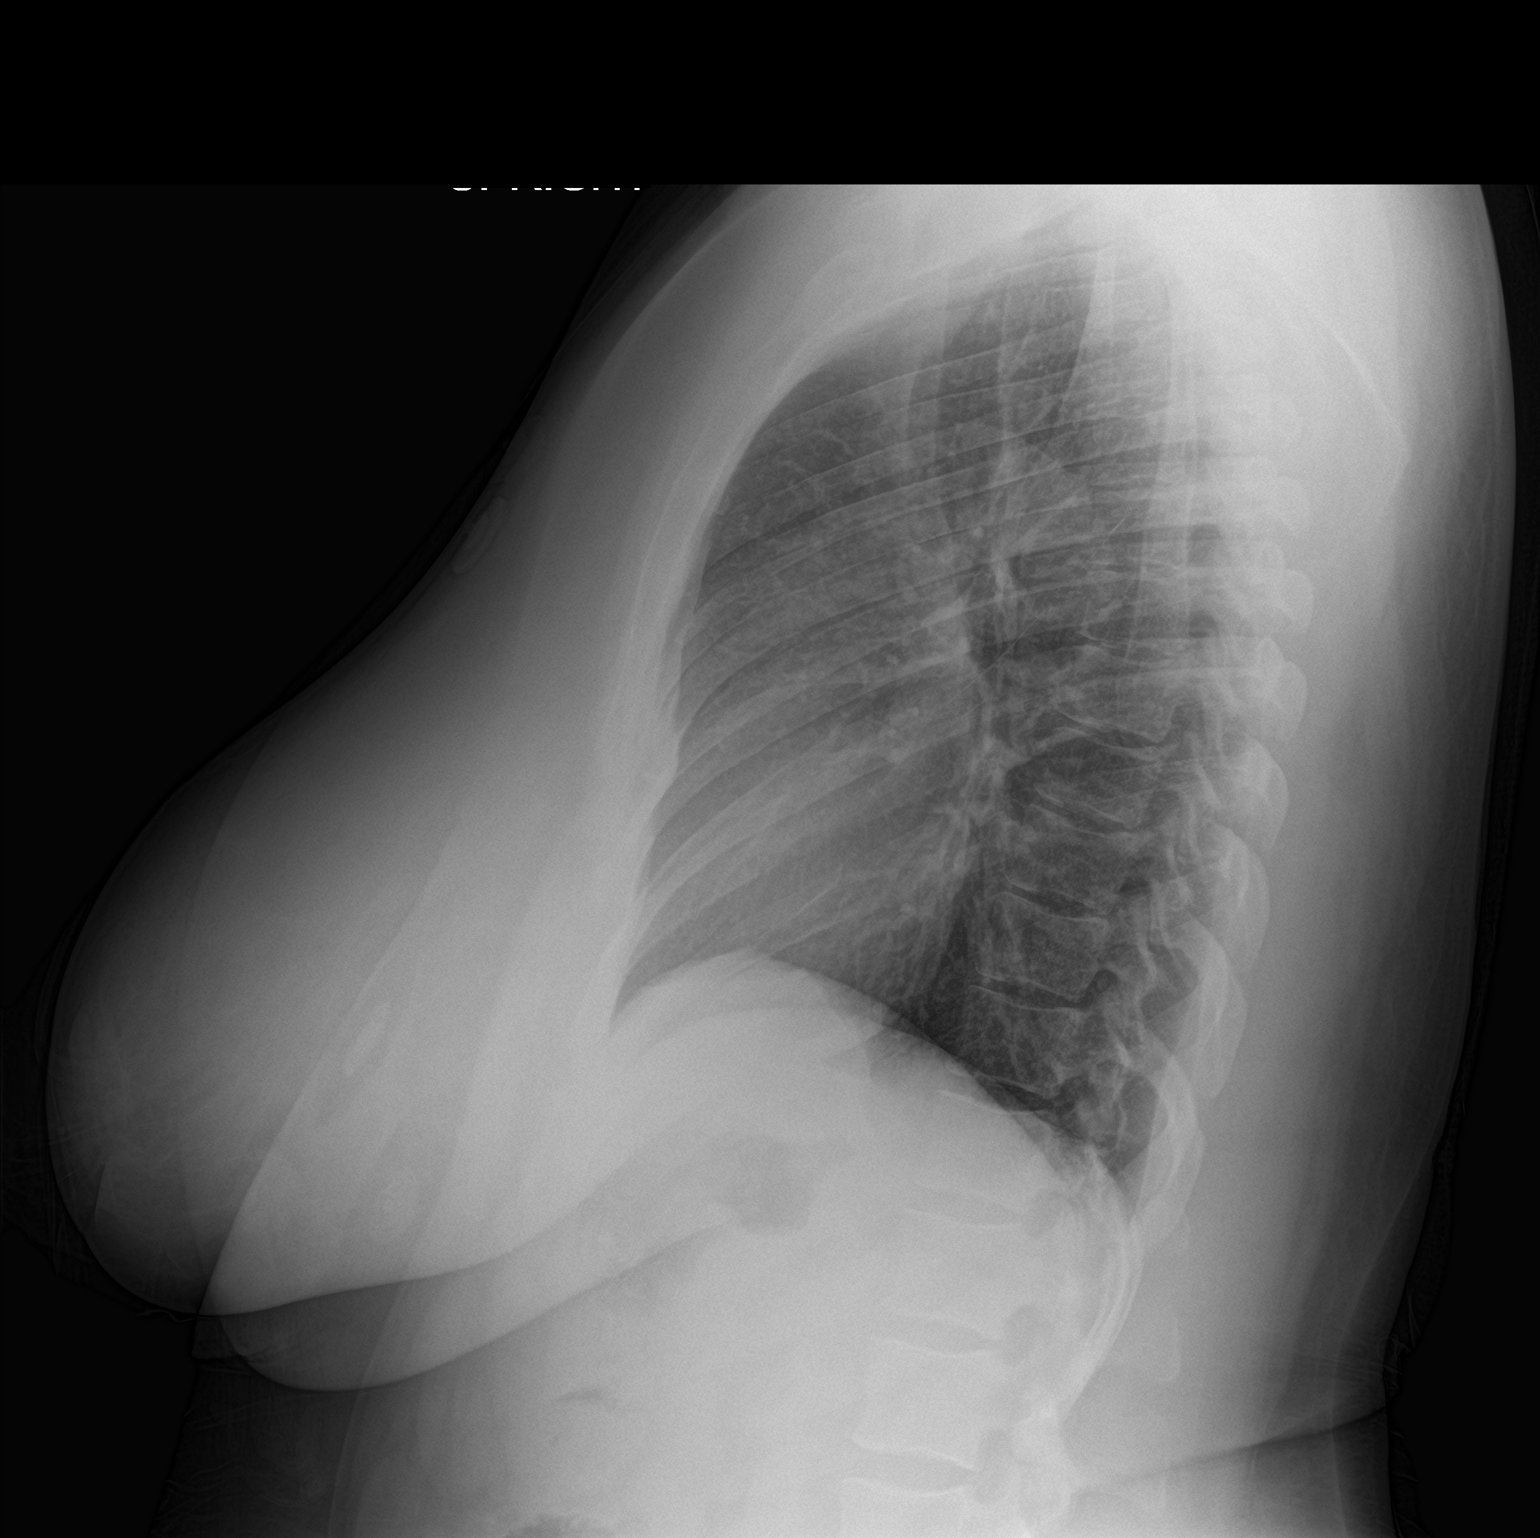

[2 of 2 positions shown; findings below may reference images not displayed]

FINDINGS: Normal heart size, mediastinal contours, and pulmonary vascularity.

Mild bronchitic changes without pulmonary infiltrate, pleural
effusion or pneumothorax.

Osseous mineralization normal.

No fractures or bone destruction.
IMPRESSION: No acute abnormalities.

## 2019-05-03 ENCOUNTER — Other Ambulatory Visit: Payer: Self-pay

## 2019-05-03 ENCOUNTER — Encounter (HOSPITAL_COMMUNITY): Payer: Self-pay

## 2019-05-03 ENCOUNTER — Ambulatory Visit (HOSPITAL_COMMUNITY)
Admission: EM | Admit: 2019-05-03 | Discharge: 2019-05-03 | Disposition: A | Discharge status: Home / Self Care | Payer: Self-pay | Attending: Internal Medicine | Admitting: Internal Medicine

## 2019-05-03 DIAGNOSIS — J040 Acute laryngitis: Secondary | ICD-10-CM

## 2019-05-03 NOTE — ED Provider Notes (Signed)
[place]    CSN: [id number] Arrival date & time: 05/03/19  1358      History   Chief Complaint Chief Complaint  Patient presents with  . hoarse voice, swollen throat    HPI Victoria Clayton is a 39 y.o. female with a history of seasonal allergies comes to urgent care with hoarseness of voice, nasal congestion and a feeling of throat swelling of 3 days duration.  Patient denies any postnasal drip.  No cough or sputum production.  No wheezing.  No nausea or vomiting.  No chest pain or chest pressure.  No sick contacts.  Patient had COVID-19 testing done yesterday and it was negative. HPI  Past Medical History:  Diagnosis Date  . Abnormal Pap smear   . Anemia   . Anxiety   . Asthma   . Depression   . Fibroid   . Infection    UTI  . Ovarian cyst   . 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 Vaginal Pap smear, abnormal     Patient Active Problem List   Diagnosis Date Noted  . Cocaine use 12/21/2014  . Left inguinal hernia 01/28/2013    Past Surgical History:  Procedure Laterality Date  . CESAREAN SECTION    . DILATION AND CURETTAGE OF UTERUS    . HERNIA REPAIR    . TUBAL LIGATION      OB History    Gravida  7   Para  3   Term  2   Preterm  1   AB  4   Living  3     SAB  3   TAB  1   Ectopic      Multiple      Live Births  3            Home Medications    Prior to Admission medications   Medication Sig Start Date End Date Taking? Authorizing Provider  albuterol (PROVENTIL HFA;VENTOLIN HFA) 108 (90 Base) MCG/ACT inhaler Inhale 1-2 puffs into the lungs every 6 (six) hours as needed for wheezing or shortness of breath. 01/11/18   Jerilee Hoh, Ana P, PA-C  fluticasone (FLONASE) 50 MCG/ACT nasal spray Place 2 sprays into both nostrils daily. 10/10/18 05/03/19  Ok Edwards, PA-C    Family History Family History  Problem Relation Age of Onset  . Heart disease Mother        chf  .  Hypertension Mother   . Fibromyalgia Mother   . COPD Mother   . Alcohol abuse Father   . Anesthesia problems Neg Hx     Social History Social History   Tobacco Use  . Smoking status: Current Every Day Smoker    Packs/day: 1.00    Years: 15.00    Pack years: 15.00    Types: Cigarettes  . Smokeless tobacco: Never Used  Substance Use Topics  . Alcohol use: Yes    Comment: social  . Drug use: Yes    Frequency: 3.0 times per week    Types: Marijuana     Allergies   Amoxicillin and Penicillins   Review of Systems Review of Systems  HENT: Positive for congestion. Negative for mouth sores, postnasal drip, rhinorrhea, sinus pressure and sinus pain.   Eyes: Positive for itching. Negative for photophobia, pain, discharge, redness and visual disturbance.  Gastrointestinal: Negative.   Skin: Negative.   Neurological: Negative for dizziness, weakness, light-headedness and headaches.  Physical Exam Triage Vital Signs ED Triage Vitals [05/03/19 1436]  Enc Vitals Group     BP (!) 157/90     Pulse Rate 76     Resp 16     Temp 98.1 F (36.7 C)     Temp Source Oral     SpO2 96 %     Weight 264 lb (119.7 kg)     Height 5\' 6"  (1.676 m)     Head Circumference      Peak Flow      Pain Score 0     Pain Loc      Pain Edu?      Excl. in [place]?    No data found.  Updated Vital Signs BP (!) 157/90   Pulse 76   Temp 98.1 F (36.7 C) (Oral)   Resp 16   Ht 5\' 6"  (1.676 m)   Wt 119.7 kg   SpO2 96%   BMI 42.61 kg/m   Visual Acuity Right Eye Distance:   Left Eye Distance:   Bilateral Distance:    Right Eye Near:   Left Eye Near:    Bilateral Near:     Physical Exam Vitals and nursing note reviewed.  Constitutional:      General: She is not in acute distress.    Appearance: She is not ill-appearing or toxic-appearing.  HENT:     Right Ear: Tympanic membrane normal.     Left Ear: Tympanic membrane normal.     Nose: Nose normal. No congestion or rhinorrhea.      Mouth/Throat:     Mouth: Mucous membranes are moist.     Pharynx: Posterior oropharyngeal erythema present.  Eyes:     Extraocular Movements: Extraocular movements intact.     Conjunctiva/sclera: Conjunctivae normal.  Cardiovascular:     Rate and Rhythm: Normal rate and regular rhythm.  Pulmonary:     Effort: Pulmonary effort is normal. No respiratory distress.     Breath sounds: Normal breath sounds. No wheezing or rhonchi.  Musculoskeletal:     Cervical back: Normal range of motion.  Skin:    Capillary Refill: Capillary refill takes less than 2 seconds.  Neurological:     Mental Status: She is alert.      UC Treatments / Results  Labs (all labs ordered are listed, but only abnormal results are displayed) Labs Reviewed - No data to display  EKG   Radiology No results found.  Procedures Procedures (including critical care time)  Medications Ordered in UC Medications - No data to display  Initial Impression / Assessment and Plan / UC Course  I have reviewed the triage vital signs and the nursing notes.  Pertinent labs & imaging results that were available during my care of the patient were reviewed by me and considered in my medical decision making (see chart for details).     1.  Acute laryngitis: I offered patient fluticasone nasal spray Cepacol lozenges Patient is advised to continue antihistamine medications Return precautions given No wheezing noted on exam. Final Clinical Impressions(s) / UC Diagnoses   Final diagnoses:  Acute laryngitis   Discharge Instructions   None    ED Prescriptions    None     PDMP not reviewed this encounter.   Chase Picket, MD 05/03/19 [phone]

## 2019-05-03 NOTE — ED Triage Notes (Signed)
Pt c/o swollen throat and hoarse voicex3 days. Pt denies dysphagia. Pt has non labored breathing and able to talk in complete sentences.

## 2019-05-26 ENCOUNTER — Ambulatory Visit (HOSPITAL_COMMUNITY)
Admission: EM | Admit: 2019-05-26 | Discharge: 2019-05-26 | Disposition: A | Discharge status: Home / Self Care | Payer: HRSA Program | Attending: Urgent Care | Admitting: Urgent Care

## 2019-05-26 ENCOUNTER — Other Ambulatory Visit: Payer: Self-pay

## 2019-05-26 ENCOUNTER — Ambulatory Visit (INDEPENDENT_AMBULATORY_CARE_PROVIDER_SITE_OTHER): Payer: HRSA Program

## 2019-05-26 ENCOUNTER — Encounter (HOSPITAL_COMMUNITY): Payer: Self-pay | Admitting: Urgent Care

## 2019-05-26 DIAGNOSIS — Z20822 Contact with and (suspected) exposure to covid-19: Secondary | ICD-10-CM | POA: Insufficient documentation

## 2019-05-26 DIAGNOSIS — J4541 Moderate persistent asthma with (acute) exacerbation: Secondary | ICD-10-CM | POA: Diagnosis not present

## 2019-05-26 DIAGNOSIS — F17210 Nicotine dependence, cigarettes, uncomplicated: Secondary | ICD-10-CM | POA: Diagnosis not present

## 2019-05-26 DIAGNOSIS — B349 Viral infection, unspecified: Secondary | ICD-10-CM | POA: Diagnosis not present

## 2019-05-26 DIAGNOSIS — R52 Pain, unspecified: Secondary | ICD-10-CM

## 2019-05-26 DIAGNOSIS — R0789 Other chest pain: Secondary | ICD-10-CM

## 2019-05-26 DIAGNOSIS — R0602 Shortness of breath: Secondary | ICD-10-CM

## 2019-05-26 DIAGNOSIS — R059 Cough, unspecified: Secondary | ICD-10-CM

## 2019-05-26 DIAGNOSIS — R05 Cough: Secondary | ICD-10-CM | POA: Diagnosis present

## 2019-05-26 LAB — SARS CORONAVIRUS 2 (TAT 6-24 HRS): SARS Coronavirus 2: NEGATIVE

## 2019-05-26 MED ORDER — PROMETHAZINE-DM 6.25-15 MG/5ML PO SYRP: 5.0000 mL | ORAL_SOLUTION | Freq: Every evening | ORAL | 0 refills | Status: DC | PRN

## 2019-05-26 MED ORDER — ALBUTEROL SULFATE HFA 108 (90 BASE) MCG/ACT IN AERS: 1.0000 | INHALATION_SPRAY | Freq: Four times a day (QID) | RESPIRATORY_TRACT | 0 refills | Status: DC | PRN

## 2019-05-26 MED ORDER — BENZONATATE 100 MG PO CAPS: 100.0000 mg | ORAL_CAPSULE | Freq: Three times a day (TID) | ORAL | 0 refills | Status: DC | PRN

## 2019-05-26 MED ORDER — PREDNISONE 20 MG PO TABS: ORAL_TABLET | ORAL | 0 refills | Status: DC

## 2019-05-26 NOTE — Discharge Instructions (Signed)

## 2019-05-26 NOTE — ED Triage Notes (Signed)
Pt c/o productive cough w/clear/yellow mucous, chills, SOB, weaknessx2 days. Pt has non labored breathing.

## 2019-05-26 NOTE — ED Provider Notes (Addendum)
[facility]   MRN: [id number] DOB: 1980/07/26  Subjective:   Victoria Clayton is a 39 y.o. female presenting for 2-day history of acute onset worsening malaise.  ROS as below.  Patient has a history of asthma, does not have an active inhaler.  She is also a smoker. Has not had COVID 19 vaccination.    No current facility-administered medications for this encounter.  Current Outpatient Medications:  .  albuterol (PROVENTIL HFA;VENTOLIN HFA) 108 (90 Base) MCG/ACT inhaler, Inhale 1-2 puffs into the lungs every 6 (six) hours as needed for wheezing or shortness of breath., Disp: 1 Inhaler, Rfl: 0    Allergies  Allergen Reactions  . Amoxicillin Rash  . Penicillins Itching and Rash    DID THE REACTION INVOLVE: Swelling of the face/tongue/throat, SOB, or low BP? No Sudden or severe rash/hives, skin peeling, or the inside of the mouth or nose? No Did it require medical treatment? No When did it last happen?now If all above answers are "NO", may proceed with cephalosporin use.     Past Medical History:  Diagnosis Date  . Abnormal Pap smear   . Anemia   . Anxiety   . Asthma   . Depression   . Fibroid   . Infection    UTI  . Ovarian cyst   . 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 Vaginal Pap smear, abnormal      Past Surgical History:  Procedure Laterality Date  . CESAREAN SECTION    . DILATION AND CURETTAGE OF UTERUS    . HERNIA REPAIR    . TUBAL LIGATION      Family History  Problem Relation Age of Onset  . Heart disease Mother        chf  . Hypertension Mother   . Fibromyalgia Mother   . COPD Mother   . Alcohol abuse Father   . Anesthesia problems Neg Hx     Social History   Tobacco Use  . Smoking status: Current Every Day Smoker    Packs/day: 1.00    Years: 15.00    Pack years: 15.00    Types: Cigarettes  . Smokeless tobacco: Never Used  Substance Use Topics  . Alcohol use: Yes   Comment: social  . Drug use: Yes    Frequency: 3.0 times per week    Types: Marijuana    Review of Systems  Constitutional: Positive for malaise/fatigue. Negative for fever.  HENT: Negative for congestion, ear pain, sinus pain and sore throat.   Eyes: Negative for discharge and redness.  Respiratory: Positive for cough, shortness of breath and wheezing. Negative for hemoptysis.   Cardiovascular: Positive for chest pain (with coughing).  Gastrointestinal: Negative for abdominal pain, diarrhea, nausea and vomiting.  Genitourinary: Negative for dysuria, flank pain and hematuria.  Musculoskeletal: Positive for myalgias.  Skin: Negative for rash.  Neurological: Negative for dizziness, weakness and headaches.  Psychiatric/Behavioral: Negative for depression and substance abuse.     Objective:   Vitals: BP (!) 148/98 (BP Location: Left Arm)   Pulse 83   Temp 97.9 F (36.6 C) (Oral)   Resp 18   Ht 5\' 7"  (1.702 m)   Wt 270 lb (122.5 kg)   SpO2 98%   BMI 42.29 kg/m   Physical Exam Constitutional:      General: She is not in acute distress.    Appearance: Normal appearance. She is well-developed. She is obese. She  is not ill-appearing, toxic-appearing or diaphoretic.  HENT:     Head: Normocephalic and atraumatic.     Nose: Nose normal.     Mouth/Throat:     Mouth: Mucous membranes are moist.  Eyes:     Extraocular Movements: Extraocular movements intact.     Pupils: Pupils are equal, round, and reactive to light.  Cardiovascular:     Rate and Rhythm: Normal rate and regular rhythm.     Pulses: Normal pulses.     Heart sounds: Normal heart sounds. No murmur. No friction rub. No gallop.   Pulmonary:     Effort: Pulmonary effort is normal. No respiratory distress.     Breath sounds: No stridor. Wheezing (lower lung fields bilaterally) and rhonchi present. No rales.  Skin:    General: Skin is warm and dry.     Findings: No rash.  Neurological:     Mental Status: She is  alert and oriented to person, place, and time.  Psychiatric:        Mood and Affect: Mood normal.        Behavior: Behavior normal.        Thought Content: Thought content normal.        Judgment: Judgment normal.     DG Chest 2 View  Result Date: 05/26/2019 CLINICAL DATA:  Cough and wheezing.  Chest pain. EXAM: CHEST - 2 VIEW COMPARISON:  September 14, 2018 FINDINGS: The heart, hila, mediastinum, and pleura are normal. The left lung is clear. Mild opacity in the medial right lung base is similar to September 2020, likely vascular crowding. No convincing evidence of infiltrate. IMPRESSION: No convincing evidence of pneumonia. Probable vascular crowding in the medial right lung base. Electronically Signed   By: Dorise Bullion III M.D   On: 05/26/2019 17:21    Assessment and Plan :   PDMP not reviewed this encounter.  1. Cough   2. Atypical chest pain   3. Shortness of breath   4. Body aches   5. Moderate persistent asthma with acute exacerbation   6. Viral syndrome     Start prednisone course to address asthma likely reactive to her viral syndrome. Refilled albuterol inhaler. COVID 19 testing pending. Counseled patient on potential for adverse effects with medications prescribed/recommended today, ER and return-to-clinic precautions discussed, patient verbalized understanding.     Jaynee Eagles, [place] 05/26/19 1732

## 2019-08-26 ENCOUNTER — Ambulatory Visit (HOSPITAL_COMMUNITY)
Admission: EM | Admit: 2019-08-26 | Discharge: 2019-08-26 | Disposition: A | Discharge status: Home / Self Care | Payer: Medicaid Other

## 2019-08-26 ENCOUNTER — Ambulatory Visit
Admission: EM | Admit: 2019-08-26 | Discharge: 2019-08-26 | Disposition: A | Discharge status: Home / Self Care | Payer: Medicaid Other | Attending: Emergency Medicine | Admitting: Emergency Medicine

## 2019-08-26 ENCOUNTER — Encounter: Payer: Self-pay | Admitting: Emergency Medicine

## 2019-08-26 ENCOUNTER — Other Ambulatory Visit: Payer: Self-pay

## 2019-08-26 DIAGNOSIS — Z20822 Contact with and (suspected) exposure to covid-19: Secondary | ICD-10-CM

## 2019-08-26 DIAGNOSIS — J069 Acute upper respiratory infection, unspecified: Secondary | ICD-10-CM | POA: Diagnosis not present

## 2019-08-26 MED ORDER — BENZONATATE 100 MG PO CAPS: 100.0000 mg | ORAL_CAPSULE | Freq: Three times a day (TID) | ORAL | 0 refills | Status: AC | PRN

## 2019-08-26 MED ORDER — PROMETHAZINE-DM 6.25-15 MG/5ML PO SYRP: 5.0000 mL | ORAL_SOLUTION | Freq: Every evening | ORAL | 0 refills | Status: DC | PRN

## 2019-08-26 MED ORDER — FLUTICASONE PROPIONATE 50 MCG/ACT NA SUSP: 1.0000 | Freq: Every day | NASAL | 0 refills | Status: DC

## 2019-08-26 NOTE — Discharge Instructions (Addendum)
Your COVID test is pending - it is important to quarantine / isolate at home until your results are back. °If you test positive and would like further evaluation for persistent or worsening symptoms, you may schedule an E-visit or virtual (video) visit throughout the [facility] MyChart app or website. ° °PLEASE NOTE: If you develop severe chest pain or shortness of breath please go to the ER or call 9-1-1 for further evaluation --> DO NOT schedule electronic or virtual visits for this. °Please call our office for further guidance / recommendations as needed. ° °For information about the Covid vaccine, please visit [email or web address] °

## 2019-08-26 NOTE — ED Provider Notes (Signed)
[facility]    CSN: [id number] Arrival date & time: 08/26/19  1457      History   Chief Complaint Chief Complaint  Patient presents with  . Fever  . Cough    HPI Victoria Clayton is a 39 y.o. female  Presenting for Covid testing: Exposure: daughter Date of exposure: numerous times in last few weeks Any fever, symptoms since exposure: yes: Nonproductive cough with malaise, subjective fever, nasal congestion.  Not taking thing for symptoms.  No chest pain, palpitations, shortness of breath, vomiting, diarrhea.  Good oral intake.   Past Medical History:  Diagnosis Date  . Abnormal Pap smear   . Anemia   . Anxiety   . Asthma   . Depression   . Fibroid   . Infection    UTI  . Ovarian cyst   . 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 Vaginal Pap smear, abnormal     Patient Active Problem List   Diagnosis Date Noted  . Cocaine use 12/21/2014  . Left inguinal hernia 01/28/2013    Past Surgical History:  Procedure Laterality Date  . CESAREAN SECTION    . DILATION AND CURETTAGE OF UTERUS    . HERNIA REPAIR    . TUBAL LIGATION      OB History    Gravida  7   Para  3   Term  2   Preterm  1   AB  4   Living  3     SAB  3   TAB  1   Ectopic      Multiple      Live Births  3            Home Medications    Prior to Admission medications   Medication Sig Start Date End Date Taking? Authorizing Provider  benzonatate (TESSALON) 100 MG capsule Take 1-2 capsules (100-200 mg total) by mouth 3 (three) times daily as needed for up to 7 days. 08/26/19 09/02/19  Hall-Potvin, [place], PA-C  fluticasone (FLONASE) 50 MCG/ACT nasal spray Place 1 spray into both nostrils daily. 08/26/19   Hall-Potvin, [place], PA-C  promethazine-dextromethorphan (PROMETHAZINE-DM) 6.25-15 MG/5ML syrup Take 5 mLs by mouth at bedtime as needed for cough. 08/26/19   Hall-Potvin, [place], PA-C  albuterol (VENTOLIN  HFA) 108 (90 Base) MCG/ACT inhaler Inhale 1-2 puffs into the lungs every 6 (six) hours as needed for wheezing or shortness of breath. Patient not taking: Reported on 08/26/2019 05/26/19 08/26/19  Jaynee Eagles, PA-C    Family History Family History  Problem Relation Age of Onset  . Heart disease Mother        chf  . Hypertension Mother   . Fibromyalgia Mother   . COPD Mother   . Alcohol abuse Father   . Anesthesia problems Neg Hx     Social History Social History   Tobacco Use  . Smoking status: Current Every Day Smoker    Packs/day: 1.00    Years: 15.00    Pack years: 15.00    Types: Cigarettes  . Smokeless tobacco: Never Used  Substance Use Topics  . Alcohol use: Yes    Comment: social  . Drug use: Yes    Frequency: 3.0 times per week    Types: Marijuana     Allergies   Amoxicillin and Penicillins   Review of Systems As per HPI   Physical Exam Triage Vital Signs ED Triage Vitals  Enc Vitals Group     BP      Pulse      Resp      Temp      Temp src      SpO2      Weight      Height      Head Circumference      Peak Flow      Pain Score      Pain Loc      Pain Edu?      Excl. in [place]?    No data found.  Updated Vital Signs BP (!) 168/93 (BP Location: Left Arm)   Pulse 83   Temp 98.8 F (37.1 C) (Oral)   Resp 18   SpO2 95%   Visual Acuity Right Eye Distance:   Left Eye Distance:   Bilateral Distance:    Right Eye Near:   Left Eye Near:    Bilateral Near:     Physical Exam Constitutional:      General: She is not in acute distress.    Appearance: She is obese. She is not ill-appearing or diaphoretic.  HENT:     Head: Normocephalic and atraumatic.     Mouth/Throat:     Mouth: Mucous membranes are moist.     Pharynx: Oropharynx is clear. No oropharyngeal exudate or posterior oropharyngeal erythema.  Eyes:     General: No scleral icterus.    Conjunctiva/sclera: Conjunctivae normal.     Pupils: Pupils are equal, round, and reactive to  light.  Neck:     Comments: Trachea midline, negative JVD Cardiovascular:     Rate and Rhythm: Normal rate and regular rhythm.     Heart sounds: No murmur heard.  No gallop.   Pulmonary:     Effort: Pulmonary effort is normal. No respiratory distress.     Breath sounds: No wheezing, rhonchi or rales.  Musculoskeletal:     Cervical back: Neck supple. No tenderness.  Lymphadenopathy:     Cervical: No cervical adenopathy.  Skin:    Capillary Refill: Capillary refill takes less than 2 seconds.     Coloration: Skin is not jaundiced or pale.     Findings: No rash.  Neurological:     General: No focal deficit present.     Mental Status: She is alert and oriented to person, place, and time.      UC Treatments / Results  Labs (all labs ordered are listed, but only abnormal results are displayed) Labs Reviewed  NOVEL CORONAVIRUS, NAA    EKG   Radiology No results found.  Procedures Procedures (including critical care time)  Medications Ordered in UC Medications - No data to display  Initial Impression / Assessment and Plan / UC Course  I have reviewed the triage vital signs and the nursing notes.  Pertinent labs & imaging results that were available during my care of the patient were reviewed by me and considered in my medical decision making (see chart for details).     Patient afebrile, nontoxic, with SpO2 95%.  Covid PCR pending.  Patient to quarantine until results are back.  We will treat supportively as outlined below.  Return precautions discussed, patient verbalized understanding and is agreeable to plan. Final Clinical Impressions(s) / UC Diagnoses   Final diagnoses:  Close exposure to COVID-19 virus  URI with cough and congestion     Discharge Instructions     Your COVID test is pending - it is important to quarantine /  isolate at home until your results are back. If you test positive and would like further evaluation for persistent or worsening  symptoms, you may schedule an E-visit or virtual (video) visit throughout the [facility] app or website.  PLEASE NOTE: If you develop severe chest pain or shortness of breath please go to the ER or call 9-1-1 for further evaluation --> DO NOT schedule electronic or virtual visits for this. Please call our office for further guidance / recommendations as needed.  For information about the Covid vaccine, please visit [email or web address]    ED Prescriptions    Medication Sig Dispense Auth. Provider   benzonatate (TESSALON) 100 MG capsule Take 1-2 capsules (100-200 mg total) by mouth 3 (three) times daily as needed for up to 7 days. 21 capsule Hall-Potvin, [place], PA-C   promethazine-dextromethorphan (PROMETHAZINE-DM) 6.25-15 MG/5ML syrup Take 5 mLs by mouth at bedtime as needed for cough. 100 mL Hall-Potvin, [place], PA-C   fluticasone (FLONASE) 50 MCG/ACT nasal spray Place 1 spray into both nostrils daily. 16 g Hall-Potvin, [place], PA-C     PDMP not reviewed this encounter.   Hall-Potvin, [place], [place] 08/26/19 1606

## 2019-08-26 NOTE — ED Triage Notes (Signed)
Pt here for URI sx with cough; pt sts she had negative test but now having sx and daughter is positive for covid; pt sts fever last night

## 2019-08-27 LAB — NOVEL CORONAVIRUS, NAA: SARS-CoV-2, NAA: DETECTED — AB

## 2019-08-27 LAB — SARS-COV-2, NAA 2 DAY TAT

## 2019-08-28 ENCOUNTER — Telehealth: Payer: Self-pay | Admitting: Emergency Medicine

## 2019-08-28 NOTE — Telephone Encounter (Signed)
Your test for COVID-19 was positive, meaning that you were infected with the novel coronavirus and could give the germ to others.  Please continue isolation at home for at least 10 days since the start of your symptoms. If you do not have symptoms, please isolate at home for 10 days from the day you were tested. Once you complete your 10 day quarantine, you may return to normal activities as long as you've not had a fever for over 24 hours(without taking fever reducing medicine) and your symptoms are improving. Please continue good preventive care measures, including:  frequent hand-washing, avoid touching your face, cover coughs/sneezes, stay out of crowds and keep a 6 foot distance from others.  Go to the nearest hospital emergency room if fever/cough/breathlessness are severe or illness seems like a threat to life.  Spoke with pt verbalized understanding

## 2019-10-22 ENCOUNTER — Emergency Department (HOSPITAL_COMMUNITY): Payer: Medicaid Other

## 2019-10-22 ENCOUNTER — Emergency Department (HOSPITAL_COMMUNITY)
Admission: EM | Admit: 2019-10-22 | Discharge: 2019-10-22 | Disposition: A | Discharge status: Home / Self Care | Payer: Medicaid Other | Attending: Emergency Medicine | Admitting: Emergency Medicine

## 2019-10-22 ENCOUNTER — Other Ambulatory Visit: Payer: Self-pay

## 2019-10-22 ENCOUNTER — Encounter (HOSPITAL_COMMUNITY): Payer: Self-pay

## 2019-10-22 DIAGNOSIS — N939 Abnormal uterine and vaginal bleeding, unspecified: Secondary | ICD-10-CM | POA: Diagnosis present

## 2019-10-22 DIAGNOSIS — R102 Pelvic and perineal pain: Secondary | ICD-10-CM

## 2019-10-22 DIAGNOSIS — Z7951 Long term (current) use of inhaled steroids: Secondary | ICD-10-CM | POA: Diagnosis not present

## 2019-10-22 DIAGNOSIS — D251 Intramural leiomyoma of uterus: Secondary | ICD-10-CM

## 2019-10-22 DIAGNOSIS — F17210 Nicotine dependence, cigarettes, uncomplicated: Secondary | ICD-10-CM | POA: Diagnosis not present

## 2019-10-22 DIAGNOSIS — J45909 Unspecified asthma, uncomplicated: Secondary | ICD-10-CM | POA: Diagnosis not present

## 2019-10-22 LAB — COMPREHENSIVE METABOLIC PANEL
ALT: 20 U/L (ref 0–44)
AST: 18 U/L (ref 15–41)
Albumin: 4.1 g/dL (ref 3.5–5.0)
Alkaline Phosphatase: 69 U/L (ref 38–126)
Anion gap: 10 (ref 5–15)
BUN: 10 mg/dL (ref 6–20)
CO2: 24 mmol/L (ref 22–32)
Calcium: 9.2 mg/dL (ref 8.9–10.3)
Chloride: 105 mmol/L (ref 98–111)
Creatinine, Ser: 0.74 mg/dL (ref 0.44–1.00)
GFR, Estimated: >60 mL/min (ref >60)
Glucose, Bld: 97 mg/dL (ref 70–99)
Potassium: 4.1 mmol/L (ref 3.5–5.1)
Sodium: 139 mmol/L (ref 135–145)
Total Bilirubin: 0.6 mg/dL (ref 0.3–1.2)
Total Protein: 8.1 g/dL (ref 6.5–8.1)

## 2019-10-22 LAB — CBC
HCT: 43.9 % (ref 36.0–46.0)
Hemoglobin: 14.5 g/dL (ref 12.0–15.0)
MCH: 29.8 pg (ref 26.0–34.0)
MCHC: 33 g/dL (ref 30.0–36.0)
MCV: 90.3 fL (ref 80.0–100.0)
Platelets: 366 10*3/uL (ref 150–400)
RBC: 4.86 MIL/uL (ref 3.87–5.11)
RDW: 14.5 % (ref 11.5–15.5)
WBC: 8.9 10*3/uL (ref 4.0–10.5)
nRBC: 0 % (ref 0.0–0.2)

## 2019-10-22 LAB — I-STAT BETA HCG BLOOD, ED (MC, WL, AP ONLY): I-stat hCG, quantitative: <5 m[IU]/mL (ref <5)

## 2019-10-22 LAB — URINALYSIS, ROUTINE W REFLEX MICROSCOPIC
Bacteria, UA: NONE SEEN
Bilirubin Urine: NEGATIVE
Glucose, UA: NEGATIVE mg/dL
Ketones, ur: NEGATIVE mg/dL
Nitrite: NEGATIVE
Protein, ur: 30 mg/dL — AB
RBC / HPF: >50 RBC/hpf — ABNORMAL HIGH (ref 0–5)
Specific Gravity, Urine: 1.021 (ref 1.005–1.030)
pH: 6 (ref 5.0–8.0)

## 2019-10-22 LAB — WET PREP, GENITAL
Clue Cells Wet Prep HPF POC: NONE SEEN
Sperm: NONE SEEN
Trich, Wet Prep: NONE SEEN
Yeast Wet Prep HPF POC: NONE SEEN

## 2019-10-22 LAB — LIPASE, BLOOD: Lipase: 39 U/L (ref 11–51)

## 2019-10-22 MED ORDER — ONDANSETRON HCL 4 MG/2ML IJ SOLN
4.0000 mg | Freq: Once | INTRAMUSCULAR | Status: AC
  Administered 2019-10-22: 4 mg via INTRAVENOUS
  Filled 2019-10-22: qty 2

## 2019-10-22 MED ORDER — ONDANSETRON 4 MG PO TBDP: 4.0000 mg | ORAL_TABLET | Freq: Three times a day (TID) | ORAL | 0 refills | Status: DC | PRN

## 2019-10-22 MED ORDER — ONDANSETRON 4 MG PO TBDP
4.0000 mg | ORAL_TABLET | Freq: Once | ORAL | Status: AC | PRN
  Administered 2019-10-22: 4 mg via ORAL
  Filled 2019-10-22: qty 1

## 2019-10-22 MED ORDER — NAPROXEN 500 MG PO TABS: 500.0000 mg | ORAL_TABLET | Freq: Two times a day (BID) | ORAL | 0 refills | Status: DC

## 2019-10-22 MED ORDER — KETOROLAC TROMETHAMINE 30 MG/ML IJ SOLN
30.0000 mg | Freq: Once | INTRAMUSCULAR | Status: AC
  Administered 2019-10-22: 30 mg via INTRAVENOUS
  Filled 2019-10-22: qty 1

## 2019-10-22 NOTE — ED Triage Notes (Addendum)
Per EMS- Patient reports that she woke this AM and had heavy vaginal bleeding. Patient also c/o right groin pain that radiates into the back of her right leg and chills.  Patient added that she was having lower abdominal and pelvic pain instead of groin pain.

## 2019-10-22 NOTE — ED Notes (Signed)
US at bedside

## 2019-10-22 NOTE — ED Provider Notes (Signed)
[facility] DEPT Provider Note   CSN: [id number] Arrival date & time: 10/22/19  1445     History Chief Complaint  Patient presents with   Vaginal Bleeding   Groin Pain   Chills    Victoria Clayton is a 39 y.o. female.  HPI     39 year old female with history of ovarian cyst, presents with concern for vaginal bleeding and lower abdominal pain.  Reports that she woke up this morning with both symptoms.  Describes right lower groin pain that radiates to the right leg.  Reports she is had vaginal bleeding, where she has been going through super plus tampons in 15 minutes.  Reports sharp and cramping pain located in the lower abdomen on the right.  Denies dysuria, vaginal discharge, has low suspicion for STIs.  Denies fevers, vomiting, diarrhea or constipation.  Reports that she does have some nausea in association with the pain.  Denies back pain, but reports that the pain radiates down into her right thigh.  Past Medical History:  Diagnosis Date   Abnormal Pap smear    Anemia    Anxiety    Asthma    Depression    Fibroid    Infection    UTI   Ovarian cyst    Tumor cells, uncertain whether benign or malignant    Pt states tumors near spine   Vaginal Pap smear, abnormal     Patient Active Problem List   Diagnosis Date Noted   Cocaine use 12/21/2014   Left inguinal hernia 01/28/2013    Past Surgical History:  Procedure Laterality Date   CESAREAN SECTION     DILATION AND CURETTAGE OF UTERUS     HERNIA REPAIR     TUBAL LIGATION       OB History    Gravida  7   Para  3   Term  2   Preterm  1   AB  4   Living  3     SAB  3   TAB  1   Ectopic      Multiple      Live Births  3           Family History  Problem Relation Age of Onset   Heart disease Mother        chf   Hypertension Mother    Fibromyalgia Mother    COPD Mother    Alcohol abuse Father    Anesthesia problems Neg Hx      Social History   Tobacco Use   Smoking status: Current Every Day Smoker    Packs/day: 1.00    Years: 15.00    Pack years: 15.00    Types: Cigarettes   Smokeless tobacco: Never Used  Scientific laboratory technician Use: Never used  Substance Use Topics   Alcohol use: Yes    Comment: social   Drug use: Yes    Frequency: 3.0 times per week    Types: Marijuana    Home Medications Prior to Admission medications   Medication Sig Start Date End Date Taking? Authorizing Provider  albuterol (VENTOLIN HFA) 108 (90 Base) MCG/ACT inhaler Inhale 2 puffs into the lungs as needed for wheezing or shortness of breath.   Yes [provider]  fluticasone (FLONASE) 50 MCG/ACT nasal spray Place 1 spray into both nostrils daily. 08/26/19  Yes Hall-Potvin, [place], PA-C  naproxen (NAPROSYN) 500 MG tablet Take 1 tablet (500 mg total) by mouth 2 (  two) times daily with a meal. 10/22/19   Gareth Morgan, MD  ondansetron (ZOFRAN ODT) 4 MG disintegrating tablet Take 1 tablet (4 mg total) by mouth every 8 (eight) hours as needed for nausea or vomiting. 10/22/19   Gareth Morgan, MD    Allergies    Amoxicillin and Penicillins  Review of Systems   Review of Systems  Constitutional: Negative for fever.  HENT: Negative for sore throat.   Eyes: Negative for visual disturbance.  Respiratory: Negative for cough and shortness of breath.   Cardiovascular: Negative for chest pain.  Gastrointestinal: Positive for abdominal pain and nausea. Negative for constipation, diarrhea and vomiting.  Genitourinary: Positive for vaginal bleeding. Negative for difficulty urinating and dysuria.  Musculoskeletal: Negative for back pain and neck pain.  Skin: Negative for rash.  Neurological: Positive for light-headedness. Negative for syncope and headaches.    Physical Exam Updated Vital Signs BP 130/87    Pulse 79    Temp 98.4 F (36.9 C)    Resp 16    Ht 5\' 7"  (1.702 m)    Wt 119.7 kg    LMP 10/22/2019    SpO2  97%    BMI 41.35 kg/m   Physical Exam Vitals and nursing note reviewed.  Constitutional:      General: She is not in acute distress.    Appearance: She is well-developed. She is not diaphoretic.  HENT:     Head: Normocephalic and atraumatic.  Eyes:     Conjunctiva/sclera: Conjunctivae normal.  Cardiovascular:     Rate and Rhythm: Normal rate and regular rhythm.     Heart sounds: No gallop.   Pulmonary:     Effort: Pulmonary effort is normal. No respiratory distress.  Abdominal:     General: There is no distension.     Palpations: Abdomen is soft.     Tenderness: There is no abdominal tenderness. There is no guarding.  Genitourinary:    Cervix: Cervical bleeding (no active bleeding from os) present.     Uterus: Tender.   Musculoskeletal:        General: No tenderness.     Cervical back: Normal range of motion.  Skin:    General: Skin is warm and dry.     Findings: No erythema or rash.  Neurological:     Mental Status: She is alert and oriented to person, place, and time.     ED Results / Procedures / Treatments   Labs (all labs ordered are listed, but only abnormal results are displayed) Labs Reviewed  WET PREP, GENITAL - Abnormal; Notable for the following components:      Result Value   WBC, Wet Prep HPF POC FEW (*)    All other components within normal limits  URINALYSIS, ROUTINE W REFLEX MICROSCOPIC - Abnormal; Notable for the following components:   Hgb urine dipstick LARGE (*)    Protein, ur 30 (*)    Leukocytes,Ua TRACE (*)    RBC / HPF >50 (*)    All other components within normal limits  LIPASE, BLOOD  COMPREHENSIVE METABOLIC PANEL  CBC  I-STAT BETA HCG BLOOD, ED ([facility], [facility], [facility] ONLY)  GC/CHLAMYDIA PROBE AMP ([place]) NOT AT [facility]    EKG None  Radiology US Transvaginal Non-OB  Result Date: 10/22/2019 CLINICAL DATA:  Initial evaluation for acute abdominal pain. EXAM: TRANSABDOMINAL AND TRANSVAGINAL ULTRASOUND OF PELVIS DOPPLER ULTRASOUND OF OVARIES  TECHNIQUE: Both transabdominal and transvaginal ultrasound examinations of the pelvis were performed. Transabdominal technique was  performed for global imaging of the pelvis including uterus, ovaries, adnexal regions, and pelvic cul-de-sac. It was necessary to proceed with endovaginal exam following the transabdominal exam to visualize the uterus, endometrium, and ovaries. Color and duplex Doppler ultrasound was utilized to evaluate blood flow to the ovaries. COMPARISON:  None available. FINDINGS: Uterus Measurements: 8.6 x 4.8 x 5.2 cm = volume: 11 mL. Uterus is anteverted. 1.9 x 1.5 x 1.8 cm intramural fibroid present at the right uterine body/fundus. Additional 1.1 x 0.7 x 1.2 cm intramural fibroid present at the mid right uterine body. Endometrium Thickness: 6.6 mm.  No focal abnormality visualized. Right ovary Not visualized.  No adnexal mass. Left ovary Measurements: 2.2 x 1.2 x 1.5 cm = volume: 2.1 mL. Normal appearance/no adnexal mass. Pulsed Doppler evaluation of the left ovary demonstrates normal low-resistance arterial and venous waveforms. Other findings No abnormal free fluid. IMPRESSION: 1. Fibroid uterus as detailed above. 2. Normal sonographic appearance of the left ovary. No evidence for left ovarian torsion or other acute abnormality. 3. Nonvisualization of the right ovary. No adnexal mass or free fluid within the pelvis. Electronically Signed   By: Jeannine Boga M.D.   On: 10/22/2019 18:26   US Pelvis Complete  Result Date: 10/22/2019 CLINICAL DATA:  Initial evaluation for acute abdominal pain. EXAM: TRANSABDOMINAL AND TRANSVAGINAL ULTRASOUND OF PELVIS DOPPLER ULTRASOUND OF OVARIES TECHNIQUE: Both transabdominal and transvaginal ultrasound examinations of the pelvis were performed. Transabdominal technique was performed for global imaging of the pelvis including uterus, ovaries, adnexal regions, and pelvic cul-de-sac. It was necessary to proceed with endovaginal exam following the  transabdominal exam to visualize the uterus, endometrium, and ovaries. Color and duplex Doppler ultrasound was utilized to evaluate blood flow to the ovaries. COMPARISON:  None available. FINDINGS: Uterus Measurements: 8.6 x 4.8 x 5.2 cm = volume: 11 mL. Uterus is anteverted. 1.9 x 1.5 x 1.8 cm intramural fibroid present at the right uterine body/fundus. Additional 1.1 x 0.7 x 1.2 cm intramural fibroid present at the mid right uterine body. Endometrium Thickness: 6.6 mm.  No focal abnormality visualized. Right ovary Not visualized.  No adnexal mass. Left ovary Measurements: 2.2 x 1.2 x 1.5 cm = volume: 2.1 mL. Normal appearance/no adnexal mass. Pulsed Doppler evaluation of the left ovary demonstrates normal low-resistance arterial and venous waveforms. Other findings No abnormal free fluid. IMPRESSION: 1. Fibroid uterus as detailed above. 2. Normal sonographic appearance of the left ovary. No evidence for left ovarian torsion or other acute abnormality. 3. Nonvisualization of the right ovary. No adnexal mass or free fluid within the pelvis. Electronically Signed   By: Jeannine Boga M.D.   On: 10/22/2019 18:26   [place] Art/Ven Flow Abd Pelv Doppler  Result Date: 10/22/2019 CLINICAL DATA:  Initial evaluation for acute abdominal pain. EXAM: TRANSABDOMINAL AND TRANSVAGINAL ULTRASOUND OF PELVIS DOPPLER ULTRASOUND OF OVARIES TECHNIQUE: Both transabdominal and transvaginal ultrasound examinations of the pelvis were performed. Transabdominal technique was performed for global imaging of the pelvis including uterus, ovaries, adnexal regions, and pelvic cul-de-sac. It was necessary to proceed with endovaginal exam following the transabdominal exam to visualize the uterus, endometrium, and ovaries. Color and duplex Doppler ultrasound was utilized to evaluate blood flow to the ovaries. COMPARISON:  None available. FINDINGS: Uterus Measurements: 8.6 x 4.8 x 5.2 cm = volume: 11 mL. Uterus is anteverted. 1.9 x 1.5 x 1.8 cm  intramural fibroid present at the right uterine body/fundus. Additional 1.1 x 0.7 x 1.2 cm intramural fibroid present at the mid  right uterine body. Endometrium Thickness: 6.6 mm.  No focal abnormality visualized. Right ovary Not visualized.  No adnexal mass. Left ovary Measurements: 2.2 x 1.2 x 1.5 cm = volume: 2.1 mL. Normal appearance/no adnexal mass. Pulsed Doppler evaluation of the left ovary demonstrates normal low-resistance arterial and venous waveforms. Other findings No abnormal free fluid. IMPRESSION: 1. Fibroid uterus as detailed above. 2. Normal sonographic appearance of the left ovary. No evidence for left ovarian torsion or other acute abnormality. 3. Nonvisualization of the right ovary. No adnexal mass or free fluid within the pelvis. Electronically Signed   By: Jeannine Boga M.D.   On: 10/22/2019 18:26    Procedures Procedures (including critical care time)  Medications Ordered in ED Medications  ondansetron (ZOFRAN-ODT) disintegrating tablet 4 mg (4 mg Oral Given 10/22/19 1512)  ondansetron (ZOFRAN) injection 4 mg (4 mg Intravenous Given 10/22/19 1820)  ketorolac (TORADOL) 30 MG/ML injection 30 mg (30 mg Intravenous Given 10/22/19 1733)    ED Course  I have reviewed the triage vital signs and the nursing notes.  Pertinent labs & imaging results that were available during my care of the patient were reviewed by me and considered in my medical decision making (see chart for details).    MDM Rules/Calculators/A&P                          39 year old female with history of ovarian cyst, presents with concern for vaginal bleeding and lower abdominal pain.  Pelvic exam shows blood, without other acute abnormalities, no sign of significant active hemorrhage from the os.  Pregnancy test negative.  Hemoglobin is normal.  CMP shows no sign of hepatitis or other significant abnormalities.  Abdominal exam is benign, no leukocytosis abdominal exam is benign, no leukocytosis, no  fevers, no change in appetite, have low suspicion for appendicitis, diverticulitis.  She does not have vomiting, is tolerating p.o. and have low suspicion for small bowel obstruction.  UA consistent with bleeding, unlikely to be UTI.   Transvaginal ultrasound was performed which showed no evidence of ovarian torsion.  It is significant for uterine fibroids.  Feel this is likely cause of her dysfunctional uterine bleeding and abdominal pain.  Discussed other possibility would be radicular pain from the back causing pain into the leg. Given rx for naproxen. Given one day of vaginal bleeding, do not recommend pill to stop bleeding at this time but recommend OBGYN follow up. Patient discharged in stable condition with understanding of reasons to return.       Final Clinical Impression(s) / ED Diagnoses Final diagnoses:  Vaginal bleeding  Pelvic pain  Abnormal uterine bleeding  Intramural uterine fibroid    Rx / DC Orders ED Discharge Orders         Ordered    naproxen (NAPROSYN) 500 MG tablet  2 times daily with meals        10/22/19 1856    ondansetron (ZOFRAN ODT) 4 MG disintegrating tablet  Every 8 hours PRN        10/22/19 1856           Gareth Morgan, MD 10/23/19 2992

## 2019-10-23 LAB — GC/CHLAMYDIA PROBE AMP (~~LOC~~) NOT AT ARMC
Chlamydia: NEGATIVE
Comment: NEGATIVE
Comment: NORMAL
Neisseria Gonorrhea: NEGATIVE

## 2019-11-25 ENCOUNTER — Ambulatory Visit (INDEPENDENT_AMBULATORY_CARE_PROVIDER_SITE_OTHER): Payer: Medicaid Other | Admitting: Obstetrics & Gynecology

## 2019-11-25 ENCOUNTER — Other Ambulatory Visit: Payer: Self-pay

## 2019-11-25 ENCOUNTER — Encounter: Payer: Self-pay | Admitting: Obstetrics & Gynecology

## 2019-11-25 ENCOUNTER — Other Ambulatory Visit (HOSPITAL_COMMUNITY)
Admission: RE | Admit: 2019-11-25 | Discharge: 2019-11-25 | Disposition: A | Discharge status: Home / Self Care | Payer: Medicaid Other | Source: Ambulatory Visit | Attending: Obstetrics & Gynecology | Admitting: Obstetrics & Gynecology

## 2019-11-25 VITALS — BP 147/108 | HR 79 | Wt 272.0 lb

## 2019-11-25 DIAGNOSIS — F1290 Cannabis use, unspecified, uncomplicated: Secondary | ICD-10-CM

## 2019-11-25 DIAGNOSIS — Z124 Encounter for screening for malignant neoplasm of cervix: Secondary | ICD-10-CM | POA: Diagnosis not present

## 2019-11-25 DIAGNOSIS — Z1231 Encounter for screening mammogram for malignant neoplasm of breast: Secondary | ICD-10-CM | POA: Diagnosis not present

## 2019-11-25 DIAGNOSIS — D251 Intramural leiomyoma of uterus: Secondary | ICD-10-CM

## 2019-11-25 DIAGNOSIS — N921 Excessive and frequent menstruation with irregular cycle: Secondary | ICD-10-CM | POA: Insufficient documentation

## 2019-11-25 DIAGNOSIS — Z87898 Personal history of other specified conditions: Secondary | ICD-10-CM

## 2019-11-25 DIAGNOSIS — F1491 Cocaine use, unspecified, in remission: Secondary | ICD-10-CM

## 2019-11-25 DIAGNOSIS — N898 Other specified noninflammatory disorders of vagina: Secondary | ICD-10-CM

## 2019-11-25 LAB — POCT PREGNANCY, URINE: Preg Test, Ur: NEGATIVE

## 2019-11-25 MED ORDER — METRONIDAZOLE 0.75 % VA GEL: 1.0000 | Freq: Every day | VAGINAL | 0 refills | Status: DC

## 2019-11-25 NOTE — Progress Notes (Signed)
39 y.o. [id number] Single Black or African American female here for annual exam and for discussion of heave bleeding that patient has experience for about 9 years.  For the past few years, her cycles have not been normal.  Most recently, she was seen in the ER on 10/22/2019 due to the amount of bleeding that was much heavier and with clots than previously.  She was very scared about how much bleeding was present as well as the maount of cramping she experienced.  Ultrasound showed uterus measuring 8.6 x 4.8 x 5.2cm.  Endometrial thickness was 6.20mm.  1.9cm and 1.1cm intramural fibroids present.    She typically has two bleeding episodes a month.  The bleeding can last anywhere from 7 - 30 days.  Typically, most days are heavy.  Uses pads and tampons and can change up to 6 or 7 times a day.  She does have gushes of blood at time.  She does not always have clots.  She does have a lot of cramping.    Pt reports hx of "hernia" surgery that was done vaginally and took about 8 months to fully recover.  Was done at [facility] here in [place].  Pt cannot recall surgeon or specialist she saw.    Patient's last menstrual period was 11/15/2019 (exact date).          Sexually active: Yes.    The current method of family planning is tubal ligation.    Exercising: walking Smoker:  Yes, between 1/2 -1 ppd  Health Maintenance: Pap:  unsure History of abnormal Pap:  yes MMG:  none TDaP:  unsure Screening Labs: CMP, CBC done 10/2019   reports that she has been smoking cigarettes. She has a 15.00 pack-year smoking history. She has never used smokeless tobacco. She reports current alcohol use. She reports current drug use. Frequency: 3.00 times per week. Drug: Marijuana.  Past Medical History:  Diagnosis Date  . Abnormal Pap smear   . Anemia   . Aneurysmal bone cyst    stable over 5 years, saw NSG  . Anxiety   . Asthma   . Depression   . Fibroid   . Infection    UTI    Past  Surgical History:  Procedure Laterality Date  . [place], 2000, 2003  . DILATION AND CURETTAGE OF UTERUS     wiht miscarriage  . HERNIA REPAIR    . TUBAL LIGATION      Current Outpatient Medications  Medication Sig Dispense Refill  . albuterol (VENTOLIN HFA) 108 (90 Base) MCG/ACT inhaler Inhale 2 puffs into the lungs as needed for wheezing or shortness of breath.    . fluticasone (FLONASE) 50 MCG/ACT nasal spray Place 1 spray into both nostrils daily. 16 g 0  . naproxen (NAPROSYN) 500 MG tablet Take 1 tablet (500 mg total) by mouth 2 (two) times daily with a meal. 30 tablet 0  . ondansetron (ZOFRAN ODT) 4 MG disintegrating tablet Take 1 tablet (4 mg total) by mouth every 8 (eight) hours as needed for nausea or vomiting. 20 tablet 0   No current facility-administered medications for this visit.    Family History  Problem Relation Age of Onset  . Heart disease Mother        chf  . Hypertension Mother   . Fibromyalgia Mother   . COPD Mother   . Alcohol abuse Father   . Anesthesia problems Neg Hx     Review of  Systems  Constitutional: Negative.   Respiratory: Negative.   Cardiovascular: Negative.   Gastrointestinal: Negative.   Genitourinary: Positive for vaginal bleeding.  Musculoskeletal: Negative.   Hematological: Negative.   Psychiatric/Behavioral: Negative.     Exam:   BP (!) 147/108   Pulse 79   Wt 272 lb (123.4 kg)   LMP 11/15/2019 (Exact Date)   BMI 42.60 kg/m      General appearance: alert, cooperative and appears stated age Head: Normocephalic, without obvious abnormality, atraumatic Neck: no adenopathy, supple, symmetrical, trachea midline and thyroid normal to inspection and palpation Lungs: clear to auscultation bilaterally Breasts: normal appearance, no masses or tenderness Heart: regular rate and rhythm Abdomen: soft, non-tender; bowel sounds normal; no masses,  no organomegaly Extremities: extremities normal, atraumatic, no cyanosis or  edema Skin: Skin color, texture, turgor normal. No rashes or lesions Lymph nodes: Cervical, supraclavicular, and axillary nodes normal. No abnormal inguinal nodes palpated Neurologic: Grossly normal   Pelvic: External genitalia:  no lesions              Urethra:  normal appearing urethra with no masses, tenderness or lesions              Bartholins and Skenes: normal                 Vagina: normal appearing vagina with normal color, creamy discharge, no lesions, odor is present              Cervix: no lesions              Pap taken: Yes.   Bimanual Exam:  Uterus:  normal size, contour, position, consistency, mobility, non-tender              Adnexa: normal adnexa and no mass, fullness, tenderness               Rectovaginal: Confirms               Anus:  normal sphincter tone, no lesions  Endometrial biopsy recommended due to volume of bleeding and length of menstrual cycles.  Discussed with patient.  Verbal and written consent obtained.   Procedure:  Speculum placed.  Cervix visualized and cleansed with betadine prep.  A single toothed tenaculum was applied to the anterior lip of the cervix.  Endometrial pipelle was advanced through the cervix into the endometrial cavity without difficulty.  Pipelle passed to 8cm.  Suction applied and pipelle removed with good tissue sample obtained.  Tenculum removed.  No bleeding noted.  Patient tolerated procedure well.  Chaperone, Louisa Second ,RN, was present for exam.  A:  Menorrhagia with irregular menstrual cycles H/o uterine fibroids, largest about 2cm Smoker Obesity Vaginal odor and discharge H/o BV per pt  P:   Mammogram order placed. pap smear with HR HPV obtained today Endometrial biopsy obtained today Metrogel 7.12%, one applicator nightly x 5 nights.  Pt does not want to wait until results are back.  Advised not to place anything vaginally for 24 hours to start treatment.  Yeast/BV obtained today. If all of above is normal, will  start POPs, Depo Provera, or consider progesterone IUD.  Pt most interested in hysterectomy but has not tried any conservative method at this time.  OCPs not an option due to smoking hx and age.

## 2019-11-25 NOTE — Patient Instructions (Signed)

## 2019-11-26 LAB — CERVICOVAGINAL ANCILLARY ONLY
Bacterial Vaginitis (gardnerella): POSITIVE — AB
Candida Glabrata: NEGATIVE
Candida Vaginitis: NEGATIVE
Comment: NEGATIVE
Comment: NEGATIVE
Comment: NEGATIVE

## 2019-11-27 LAB — SURGICAL PATHOLOGY

## 2019-12-02 LAB — CYTOLOGY - PAP
Chlamydia: NEGATIVE
Comment: NEGATIVE
Comment: NEGATIVE
Comment: NEGATIVE
Comment: NORMAL
Diagnosis: NEGATIVE
High risk HPV: NEGATIVE
Neisseria Gonorrhea: NEGATIVE
Trichomonas: NEGATIVE

## 2019-12-11 ENCOUNTER — Encounter: Payer: Self-pay | Admitting: *Deleted

## 2020-02-10 ENCOUNTER — Ambulatory Visit: Payer: Medicaid Other | Admitting: Obstetrics & Gynecology

## 2020-06-30 ENCOUNTER — Ambulatory Visit (HOSPITAL_COMMUNITY)
Admission: EM | Admit: 2020-06-30 | Discharge: 2020-06-30 | Disposition: A | Discharge status: Home / Self Care | Payer: Medicaid Other | Attending: Psychiatry | Admitting: Psychiatry

## 2020-06-30 ENCOUNTER — Other Ambulatory Visit: Payer: Self-pay

## 2020-06-30 DIAGNOSIS — Z79899 Other long term (current) drug therapy: Secondary | ICD-10-CM | POA: Insufficient documentation

## 2020-06-30 DIAGNOSIS — F321 Major depressive disorder, single episode, moderate: Secondary | ICD-10-CM | POA: Insufficient documentation

## 2020-06-30 MED ORDER — SERTRALINE HCL 50 MG PO TABS: 50.0000 mg | ORAL_TABLET | Freq: Every day | ORAL | 0 refills | Status: DC

## 2020-06-30 MED ORDER — HYDROXYZINE PAMOATE 25 MG PO CAPS: 25.0000 mg | ORAL_CAPSULE | Freq: Three times a day (TID) | ORAL | 0 refills | Status: DC | PRN
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
## 2020-06-30 NOTE — Discharge Instructions (Addendum)
Follow up outpatient resources for provider asap.   Patient is instructed prior to discharge to: Take all medications as prescribed by his/her mental healthcare provider. Report any adverse effects and or reactions from the medicines to his/her outpatient provider promptly. Patient has been instructed & cautioned: To not engage in alcohol and or illegal drug use while on prescription medicines. In the event of worsening symptoms, patient is instructed to call the crisis hotline, 911 and or go to the nearest ED for appropriate evaluation and treatment of symptoms. To follow-up with his/her primary care provider for your other medical issues, concerns and or health care needs.

## 2020-06-30 NOTE — Discharge Summary (Signed)
Victoria Clayton to be D/C'd Home per NP order. Discussed with the patient and all questions fully answered. An After Visit Summary was printed and given to the patient.  Patient escorted out and D/C home via private auto.  [name]  06/30/2020 5:55 PM

## 2020-10-08 ENCOUNTER — Ambulatory Visit (HOSPITAL_COMMUNITY)
Admission: EM | Admit: 2020-10-08 | Discharge: 2020-10-08 | Disposition: A | Discharge status: Home / Self Care | Payer: Medicaid Other | Attending: Physician Assistant | Admitting: Physician Assistant

## 2020-10-08 ENCOUNTER — Encounter (HOSPITAL_COMMUNITY): Payer: Self-pay | Admitting: Emergency Medicine

## 2020-10-08 ENCOUNTER — Other Ambulatory Visit: Payer: Self-pay

## 2020-10-08 DIAGNOSIS — J029 Acute pharyngitis, unspecified: Secondary | ICD-10-CM | POA: Insufficient documentation

## 2020-10-08 LAB — POCT RAPID STREP A, ED / UC: Streptococcus, Group A Screen (Direct): NEGATIVE

## 2020-10-08 MED ORDER — FLUTICASONE PROPIONATE 50 MCG/ACT NA SUSP: 1.0000 | Freq: Every day | NASAL | 0 refills | Status: DC

## 2020-10-08 NOTE — Discharge Instructions (Addendum)
Use flonase daily. Follow up if no improvement or if symptoms worsen in any way.

## 2020-10-08 NOTE — ED Provider Notes (Signed)
[place]    CSN: [id number] Arrival date & time: 10/08/20  1648      History   Chief Complaint Chief Complaint  Patient presents with   Otalgia   Sore Throat    HPI Victoria Clayton is a 40 y.o. female.   Patient here today for evaluation of sore throat that started a few days ago.  She reports that now she is having some pain in her right ear.  She has not had any fever.  She denies any cough.  She has not had any abdominal pain, nausea, vomiting or diarrhea.  She has tried rinsing her mouth with Listerine without improvement of symptoms.  The history is provided by the patient.  Otalgia Associated symptoms: sore throat   Associated symptoms: Clayton congestion, Clayton cough, Clayton fever and Clayton vomiting   Sore Throat Pertinent negatives include Clayton shortness of breath.   Past Medical History:  Diagnosis Date   Abnormal Pap smear    Anemia    Aneurysmal bone cyst    stable over 5 years, saw NSG   Anxiety    Asthma    Depression    Fibroid    Infection    UTI    Patient Active Problem List   Diagnosis Date Noted   History of cocaine use 11/25/2019   Marijuana use 11/25/2019   Intramural leiomyoma of uterus 11/25/2019   Menorrhagia with irregular cycle 11/25/2019   Left inguinal hernia 01/28/2013    Past Surgical History:  Procedure Laterality Date   CESAREAN SECTION  1998, 2000, 2003   DILATION AND CURETTAGE OF UTERUS     with miscarriage   HERNIA REPAIR     TUBAL LIGATION      OB History     Gravida  7   Para  3   Term  2   Preterm  1   AB  4   Living  3      SAB  3   IAB  1   Ectopic  0   Multiple  0   Live Births  3            Home Medications    Prior to Admission medications   Medication Sig Start Date End Date Taking? Authorizing Provider  fluticasone (FLONASE) 50 MCG/ACT nasal spray Place 1 spray into both nostrils daily. 10/08/20  Yes Francene Finders, PA-C  albuterol (VENTOLIN HFA) 108 (90 Base) MCG/ACT  inhaler Inhale 2 puffs into the lungs as needed for wheezing or shortness of breath.    [provider]  hydrOXYzine (VISTARIL) 25 MG capsule Take 1 capsule (25 mg total) by mouth 3 (three) times daily as needed. 06/30/20   Revonda Humphrey, NP  lisinopril-hydrochlorothiazide (ZESTORETIC) 10-12.5 MG tablet Take 1 tablet by mouth daily. 09/16/20   [provider]  sertraline (ZOLOFT) 50 MG tablet Take 1 tablet (50 mg total) by mouth daily. 06/30/20 06/30/21  Revonda Humphrey, NP    Family History Family History  Problem Relation Age of Onset   Heart disease Mother        chf   Hypertension Mother    Fibromyalgia Mother    COPD Mother    Alcohol abuse Father    Anesthesia problems Neg Hx     Social History Social History   Tobacco Use   Smoking status: Every Day    Packs/day: 1.00    Years: 15.00    Pack years: 15.00  Types: Cigarettes   Smokeless tobacco: Never  Vaping Use   Vaping Use: Never used  Substance Use Topics   Alcohol use: Yes    Comment: social   Drug use: Yes    Frequency: 3.0 times per week    Types: Marijuana     Allergies   Amoxicillin and Penicillins   Review of Systems Review of Systems  Constitutional:  Negative for chills and fever.  HENT:  Positive for ear pain and sore throat. Negative for congestion.   Eyes:  Negative for discharge and redness.  Respiratory:  Negative for cough and shortness of breath.   Gastrointestinal:  Negative for nausea and vomiting.    Physical Exam Triage Vital Signs ED Triage Vitals  Enc Vitals Group     BP 10/08/20 1738 (!) 169/124     Pulse Rate 10/08/20 1738 89     Resp 10/08/20 1738 18     Temp 10/08/20 1738 98.4 F (36.9 C)     Temp src --      SpO2 10/08/20 1738 94 %     Weight --      Height --      Head Circumference --      Peak Flow --      Pain Score 10/08/20 1737 9     Pain Loc --      Pain Edu? --      Excl. in [place]? --    Clayton data found.  Updated Vital Signs BP (!)  169/124   Pulse 89   Temp 98.4 F (36.9 C)   Resp 18   SpO2 94%    Physical Exam Vitals and nursing note reviewed.  Constitutional:      General: She is not in acute distress.    Appearance: She is well-developed. She is not ill-appearing.  HENT:     Head: Normocephalic and atraumatic.     Right Ear: Tympanic membrane normal.     Left Ear: Tympanic membrane normal.     Nose: Clayton congestion.     Mouth/Throat:     Mouth: Mucous membranes are moist.     Pharynx: Posterior oropharyngeal erythema present. Clayton oropharyngeal exudate.  Eyes:     Conjunctiva/sclera: Conjunctivae normal.  Cardiovascular:     Rate and Rhythm: Normal rate and regular rhythm.     Heart sounds: Normal heart sounds. Clayton murmur heard. Pulmonary:     Effort: Pulmonary effort is normal. Clayton respiratory distress.     Breath sounds: Normal breath sounds. Clayton wheezing, rhonchi or rales.  Neurological:     Mental Status: She is alert.  Psychiatric:        Mood and Affect: Mood normal.        Behavior: Behavior normal.     UC Treatments / Results  Labs (all labs ordered are listed, but only abnormal results are displayed) Labs Reviewed  CULTURE, GROUP A STREP [facility])  POCT RAPID STREP A, ED / UC    EKG   Radiology Clayton results found.  Procedures Procedures (including critical care time)  Medications Ordered in UC Medications - Clayton data to display  Initial Impression / Assessment and Plan / UC Course  I have reviewed the triage vital signs and the nursing notes.  Pertinent labs & imaging results that were available during my care of the patient were reviewed by me and considered in my medical decision making (see chart for details).  Rapid strep test negative in office.  Will order  throat culture for further evaluation.  Recommended symptomatic treatment in the meantime.  COVID testing deferred as patient reports she just had COVID about a week ago.  Encouraged follow-up with any further  concerns.  Final Clinical Impressions(s) / UC Diagnoses   Final diagnoses:  Pharyngitis, unspecified etiology     Discharge Instructions      Use flonase daily. Follow up if Clayton improvement or if symptoms worsen in any way.      ED Prescriptions     Medication Sig Dispense Auth. Provider   fluticasone (FLONASE) 50 MCG/ACT nasal spray Place 1 spray into both nostrils daily. 16 g Francene Finders, PA-C      PDMP not reviewed this encounter.   Francene Finders, PA-C 10/08/20 [phone]

## 2020-10-08 NOTE — ED Triage Notes (Signed)
Pt is present today with a sore throat and right ear pain. Pt states that x3 days ago.

## 2020-10-10 LAB — CULTURE, GROUP A STREP (THRC)

## 2020-10-12 ENCOUNTER — Telehealth (HOSPITAL_COMMUNITY): Payer: Self-pay | Admitting: Emergency Medicine

## 2020-10-12 MED ORDER — AZITHROMYCIN 250 MG PO TABS: 250.0000 mg | ORAL_TABLET | Freq: Every day | ORAL | 0 refills | Status: DC

## 2021-07-01 ENCOUNTER — Encounter (HOSPITAL_COMMUNITY): Payer: Self-pay | Admitting: Emergency Medicine

## 2021-07-01 ENCOUNTER — Ambulatory Visit (HOSPITAL_COMMUNITY)
Admission: EM | Admit: 2021-07-01 | Discharge: 2021-07-01 | Disposition: A | Discharge status: Home / Self Care | Payer: Medicaid Other | Attending: Internal Medicine | Admitting: Internal Medicine

## 2021-07-01 ENCOUNTER — Other Ambulatory Visit: Payer: Self-pay

## 2021-07-01 DIAGNOSIS — J029 Acute pharyngitis, unspecified: Secondary | ICD-10-CM | POA: Diagnosis present

## 2021-07-01 DIAGNOSIS — G4733 Obstructive sleep apnea (adult) (pediatric): Secondary | ICD-10-CM | POA: Diagnosis present

## 2021-07-01 LAB — POCT RAPID STREP A, ED / UC: Streptococcus, Group A Screen (Direct): NEGATIVE

## 2021-07-01 MED ORDER — HYDROXYZINE PAMOATE 25 MG PO CAPS: 25.0000 mg | ORAL_CAPSULE | Freq: Three times a day (TID) | ORAL | 0 refills | Status: DC | PRN

## 2021-07-01 MED ORDER — FLUTICASONE PROPIONATE 50 MCG/ACT NA SUSP: 1.0000 | Freq: Every day | NASAL | 0 refills | Status: DC

## 2021-07-01 NOTE — ED Triage Notes (Signed)
Patient c/o sore throat x 3 days.   Patient denies fever.   Patient endorses nasal drainage.   Patient endorses worsening symptoms when laying down.   Patient endorses difficulty swallowing.   Patient endorses maxillary sinus tenderness.   Patient has taken Advil PM with no relief of symptoms.

## 2021-07-01 NOTE — Discharge Instructions (Addendum)
Humidifier use will help with nasal congestion and dry throat Use medications as prescribed Please follow-up with your primary care physician to get the sleep study Return to urgent care if you have any other concerns. We will call you if labs are abnormal.

## 2021-07-04 LAB — CULTURE, GROUP A STREP (THRC)

## 2021-07-05 NOTE — ED Provider Notes (Signed)
[place]    CSN: [id number] Arrival date & time: 07/01/21  1629      History   Chief Complaint Chief Complaint  Patient presents with   Sore Throat    HPI Victoria Clayton is a 41 y.o. female comes to the urgent care with a 3-day history of sore throat.  Symptoms started insidiously and has been persistent.  She denies any fever or chills.  No nausea or vomiting.  No generalized body aches.  She has some facial pain and postnasal drainage when she lays down.  Nasal drainage is yellowish to clear.  No sick contacts.  No shortness of breath or wheezing.  She endorses sensation of foreign body.  Patient has been sleepy during the day because she has not been able to sleep well at night.  She has obstructive sleep apnea but does not have a CPAP. HPI  Past Medical History:  Diagnosis Date   Abnormal Pap smear    Anemia    Aneurysmal bone cyst    stable over 5 years, saw NSG   Anxiety    Asthma    Depression    Fibroid    Infection    UTI    Patient Active Problem List   Diagnosis Date Noted   History of cocaine use 11/25/2019   Marijuana use 11/25/2019   Intramural leiomyoma of uterus 11/25/2019   Menorrhagia with irregular cycle 11/25/2019   Left inguinal hernia 01/28/2013    Past Surgical History:  Procedure Laterality Date   CESAREAN SECTION  1998, 2000, 2003   DILATION AND CURETTAGE OF UTERUS     with miscarriage   HERNIA REPAIR     TUBAL LIGATION      OB History     Gravida  7   Para  3   Term  2   Preterm  1   AB  4   Living  3      SAB  3   IAB  1   Ectopic  0   Multiple  0   Live Births  3            Home Medications    Prior to Admission medications   Medication Sig Start Date End Date Taking? Authorizing Provider  fluticasone (FLONASE) 50 MCG/ACT nasal spray Place 1 spray into both nostrils daily. 07/01/21  Yes Thayden Lemire, Myrene Galas, MD  lisinopril-hydrochlorothiazide (ZESTORETIC) 10-12.5 MG tablet Take 1  tablet by mouth daily. 09/16/20  Yes [provider]  hydrOXYzine (VISTARIL) 25 MG capsule Take 1 capsule (25 mg total) by mouth 3 (three) times daily as needed. 07/01/21   Chase Picket, MD  sertraline (ZOLOFT) 50 MG tablet Take 1 tablet (50 mg total) by mouth daily. 06/30/20 06/30/21  Revonda Humphrey, NP    Family History Family History  Problem Relation Age of Onset   Heart disease Mother        chf   Hypertension Mother    Fibromyalgia Mother    COPD Mother    Alcohol abuse Father    Anesthesia problems Neg Hx     Social History Social History   Tobacco Use   Smoking status: Every Day    Packs/day: 1.00    Years: 15.00    Total pack years: 15.00    Types: Cigarettes   Smokeless tobacco: Never  Vaping Use   Vaping Use: Never used  Substance Use Topics   Alcohol use: Yes  Comment: social   Drug use: Yes    Frequency: 3.0 times per week    Types: Marijuana     Allergies   Amoxicillin and Penicillins   Review of Systems Review of Systems  HENT:  Positive for congestion, postnasal drip, sinus pressure and sinus pain. Negative for dental problem, ear discharge and ear pain.   Respiratory: Negative.    Cardiovascular: Negative.   Gastrointestinal: Negative.   Genitourinary: Negative.   Neurological:  Positive for headaches.     Physical Exam Triage Vital Signs ED Triage Vitals  Enc Vitals Group     BP 07/01/21 1756 (!) 156/99     Pulse Rate 07/01/21 1756 65     Resp 07/01/21 1756 18     Temp 07/01/21 1756 98.3 F (36.8 C)     Temp Source 07/01/21 1756 Oral     SpO2 07/01/21 1756 96 %     Weight --      Height --      Head Circumference --      Peak Flow --      Pain Score 07/01/21 1800 9     Pain Loc --      Pain Edu? --      Excl. in [place]? --    No data found.  Updated Vital Signs BP (!) 156/99 (BP Location: Left Arm)   Pulse 65   Temp 98.3 F (36.8 C) (Oral)   Resp 18   LMP 06/28/2021 (Exact Date)   SpO2 96%   Visual  Acuity Right Eye Distance:   Left Eye Distance:   Bilateral Distance:    Right Eye Near:   Left Eye Near:    Bilateral Near:     Physical Exam Vitals and nursing note reviewed.  Constitutional:      General: She is not in acute distress.    Appearance: She is not ill-appearing.  HENT:     Right Ear: Tympanic membrane normal.     Left Ear: Tympanic membrane normal.     Mouth/Throat:     Mouth: Mucous membranes are moist.     Pharynx: No posterior oropharyngeal erythema.     Tonsils: No tonsillar exudate or tonsillar abscesses.  Cardiovascular:     Rate and Rhythm: Normal rate and regular rhythm.  Pulmonary:     Effort: Pulmonary effort is normal.     Breath sounds: Normal breath sounds.  Neurological:     Mental Status: She is alert.      UC Treatments / Results  Labs (all labs ordered are listed, but only abnormal results are displayed) Labs Reviewed  CULTURE, GROUP A STREP [facility])  POCT RAPID STREP A, ED / UC    EKG   Radiology No results found.  Procedures Procedures (including critical care time)  Medications Ordered in UC Medications - No data to display  Initial Impression / Assessment and Plan / UC Course  I have reviewed the triage vital signs and the nursing notes.  Pertinent labs & imaging results that were available during my care of the patient were reviewed by me and considered in my medical decision making (see chart for details).     1.  Acute pharyngitis: Warm salt water gargle Humidifier and vapor rub use will help with nasal congestion and postnasal drainage Flonase Hydroxyzine as needed for anxiety Follow-up with primary care physician Return to urgent care if symptoms worsen. Final Clinical Impressions(s) / UC Diagnoses   Final diagnoses:  Acute pharyngitis,  unspecified etiology  OSA (obstructive sleep apnea)     Discharge Instructions      Humidifier use will help with nasal congestion and dry throat Use medications as  prescribed Please follow-up with your primary care physician to get the sleep study Return to urgent care if you have any other concerns. We will call you if labs are abnormal.   ED Prescriptions     Medication Sig Dispense Auth. Provider   fluticasone (FLONASE) 50 MCG/ACT nasal spray Place 1 spray into both nostrils daily. 16 g Shawnya Mayor, Myrene Galas, MD   hydrOXYzine (VISTARIL) 25 MG capsule Take 1 capsule (25 mg total) by mouth 3 (three) times daily as needed. 60 capsule Paylin Hailu, Myrene Galas, MD      PDMP not reviewed this encounter.   Chase Picket, MD 07/05/21 [phone]

## 2021-07-14 ENCOUNTER — Encounter (HOSPITAL_COMMUNITY): Payer: Self-pay | Admitting: Emergency Medicine

## 2021-07-14 ENCOUNTER — Other Ambulatory Visit: Payer: Self-pay

## 2021-07-14 ENCOUNTER — Ambulatory Visit (HOSPITAL_COMMUNITY)
Admission: EM | Admit: 2021-07-14 | Discharge: 2021-07-14 | Disposition: A | Discharge status: Home / Self Care | Payer: Medicaid Other | Attending: Emergency Medicine | Admitting: Emergency Medicine

## 2021-07-14 DIAGNOSIS — J020 Streptococcal pharyngitis: Secondary | ICD-10-CM | POA: Diagnosis not present

## 2021-07-14 LAB — POCT RAPID STREP A, ED / UC: Streptococcus, Group A Screen (Direct): POSITIVE — AB

## 2021-07-14 MED ORDER — PENICILLIN G BENZATHINE 1200000 UNIT/2ML IM SUSY
PREFILLED_SYRINGE | INTRAMUSCULAR | Status: AC
  Filled 2021-07-14: qty 2

## 2021-07-14 MED ORDER — PENICILLIN G BENZATHINE 1200000 UNIT/2ML IM SUSY
1.2000 10*6.[IU] | PREFILLED_SYRINGE | Freq: Once | INTRAMUSCULAR | Status: AC
  Administered 2021-07-14: 1.2 10*6.[IU] via INTRAMUSCULAR

## 2021-07-14 MED ORDER — AZITHROMYCIN 250 MG PO TABS: 250.0000 mg | ORAL_TABLET | Freq: Every day | ORAL | 0 refills | Status: DC

## 2021-07-14 NOTE — ED Triage Notes (Signed)
Patient c/o sinus pressure, sore throat, and RT ear pain x 2 days.   Patient denies fever at home. Patient denies headache.   Patient denies changes in hearing.   Patient endorses painful swallowing.   Patient endorses being seen previously at this clinic on 6/29 and being told that she has sleep apnea.   Patient has used flonase with no relief of symptoms. Patient has used "throat spray" with no relief of symptoms.

## 2021-07-14 NOTE — ED Provider Notes (Addendum)
[place]    CSN: [id number] Arrival date & time: 07/14/21  0820      History   Chief Complaint Chief Complaint  Patient presents with   Facial Pain   Sore Throat   Otalgia    HPI Victoria Clayton is a 41 y.o. female. Patient c/o sore throat, and RT ear pain x 2 days. Patient denies fever at home. Patient denies headache. Patient denies changes in hearing. Patient endorses painful swallowing. Patient endorses being seen previously at this clinic on 6/29 and being told that she has sleep apnea. Also had sore throat at that time that completely resolved with treatment recommendations. For current symtpoms, patient has used flonase with no relief of symptoms. Patient has used "throat spray" with no relief of symptoms. Feels like this is a separate illness from the symptoms she had 6/29     Sore Throat Pertinent negatives include no shortness of breath.  Otalgia Associated symptoms: sore throat   Associated symptoms: no congestion, no cough and no fever     Past Medical History:  Diagnosis Date   Abnormal Pap smear    Anemia    Aneurysmal bone cyst    stable over 5 years, saw NSG   Anxiety    Asthma    Depression    Fibroid    Infection    UTI    Patient Active Problem List   Diagnosis Date Noted   History of cocaine use 11/25/2019   Marijuana use 11/25/2019   Intramural leiomyoma of uterus 11/25/2019   Menorrhagia with irregular cycle 11/25/2019   Left inguinal hernia 01/28/2013    Past Surgical History:  Procedure Laterality Date   CESAREAN SECTION  1998, 2000, 2003   DILATION AND CURETTAGE OF UTERUS     with miscarriage   HERNIA REPAIR     TUBAL LIGATION      OB History     Gravida  7   Para  3   Term  2   Preterm  1   AB  4   Living  3      SAB  3   IAB  1   Ectopic  0   Multiple  0   Live Births  3            Home Medications    Prior to Admission medications   Medication Sig Start Date End Date  Taking? Authorizing Provider  azithromycin (ZITHROMAX) 250 MG tablet Take 1 tablet (250 mg total) by mouth daily. Take first 2 tablets together, then 1 every day until finished. 07/14/21  Yes Carvel Getting, NP  fluticasone (FLONASE) 50 MCG/ACT nasal spray Place 1 spray into both nostrils daily. 07/01/21  Yes Lamptey, Myrene Galas, MD  hydrOXYzine (VISTARIL) 25 MG capsule Take 1 capsule (25 mg total) by mouth 3 (three) times daily as needed. 07/01/21  Yes Lamptey, Myrene Galas, MD  lisinopril-hydrochlorothiazide (ZESTORETIC) 10-12.5 MG tablet Take 1 tablet by mouth daily. 09/16/20  Yes [provider]  sertraline (ZOLOFT) 50 MG tablet Take 1 tablet (50 mg total) by mouth daily. 06/30/20 06/30/21  Revonda Humphrey, NP    Family History Family History  Problem Relation Age of Onset   Heart disease Mother        chf   Hypertension Mother    Fibromyalgia Mother    COPD Mother    Alcohol abuse Father    Anesthesia problems Neg Hx     Social History  Social History   Tobacco Use   Smoking status: Every Day    Packs/day: 1.00    Years: 15.00    Total pack years: 15.00    Types: Cigarettes   Smokeless tobacco: Never  Vaping Use   Vaping Use: Never used  Substance Use Topics   Alcohol use: Yes    Comment: social   Drug use: Yes    Frequency: 3.0 times per week    Types: Marijuana     Allergies   Patient has no active allergies.   Review of Systems Review of Systems  Constitutional:  Negative for chills and fever.  HENT:  Positive for ear pain, sore throat and trouble swallowing. Negative for congestion and sinus pressure.   Respiratory:  Negative for cough, shortness of breath and wheezing.      Physical Exam Triage Vital Signs ED Triage Vitals  Enc Vitals Group     BP 07/14/21 0829 138/88     Pulse Rate 07/14/21 0829 95     Resp 07/14/21 0829 18     Temp 07/14/21 0829 98.3 F (36.8 C)     Temp Source 07/14/21 0829 Oral     SpO2 07/14/21 0829 95 %     Weight --       Height --      Head Circumference --      Peak Flow --      Pain Score 07/14/21 0834 9     Pain Loc --      Pain Edu? --      Excl. in [place]? --    No data found.  Updated Vital Signs BP 138/88 (BP Location: Left Arm)   Pulse 95   Temp 98.3 F (36.8 C) (Oral)   Resp 18   LMP 06/28/2021 (Exact Date)   SpO2 95%   Visual Acuity Right Eye Distance:   Left Eye Distance:   Bilateral Distance:    Right Eye Near:   Left Eye Near:    Bilateral Near:     Physical Exam Constitutional:      General: She is not in acute distress.    Appearance: She is well-developed. She is not ill-appearing.  HENT:     Right Ear: Tympanic membrane, ear canal and external ear normal.     Left Ear: Tympanic membrane, ear canal and external ear normal.     Ears:     Comments: Tenderness in the preauricular area down to her submandibular area    Nose: No congestion.     Mouth/Throat:     Mouth: Mucous membranes are moist.     Pharynx: Oropharyngeal exudate and posterior oropharyngeal erythema present.     Tonsils: Tonsillar exudate present. No tonsillar abscesses. 3+ on the right. 3+ on the left.  Cardiovascular:     Rate and Rhythm: Normal rate and regular rhythm.  Pulmonary:     Effort: Pulmonary effort is normal.     Breath sounds: Normal breath sounds.  Lymphadenopathy:     Head:     Right side of head: Submandibular adenopathy present.     Left side of head: Submandibular adenopathy present.  Neurological:     Mental Status: She is alert.      UC Treatments / Results  Labs (all labs ordered are listed, but only abnormal results are displayed) Labs Reviewed  POCT RAPID STREP A, ED / UC - Abnormal; Notable for the following components:      Result Value  Streptococcus, Group A Screen (Direct) POSITIVE (*)    All other components within normal limits    EKG   Radiology No results found.  Procedures Procedures (including critical care time)  Medications Ordered in  UC Medications  penicillin g benzathine (BICILLIN LA) 1200000 UNIT/2ML injection 1.2 Million Units (has no administration in time range)    Initial Impression / Assessment and Plan / UC Course  I have reviewed the triage vital signs and the nursing notes.  Pertinent labs & imaging results that were available during my care of the patient were reviewed by me and considered in my medical decision making (see chart for details).    Rapid strep test positive.  Patient hx notes allergy to penicillin/amoxicillin.  Pt insists she is not allergic to pcn and only allergic to the pink liquid base used to mix children's amoxicillin.  I have removed penicillin allergy from her history.  Given shot of Bicillin here.  Final Clinical Impressions(s) / UC Diagnoses   Final diagnoses:  Strep pharyngitis   Discharge Instructions   None    ED Prescriptions     Medication Sig Dispense Auth. Provider   azithromycin (ZITHROMAX) 250 MG tablet Take 1 tablet (250 mg total) by mouth daily. Take first 2 tablets together, then 1 every day until finished. 6 tablet Carvel Getting, NP      PDMP not reviewed this encounter.   Carvel Getting, NP 07/14/21 0932    Carvel Getting, NP 07/14/21 [phone]

## 2021-07-14 NOTE — ED Notes (Signed)
Prescription has been cancelled for Azithromycin per provider

## 2021-08-27 ENCOUNTER — Ambulatory Visit (HOSPITAL_COMMUNITY)
Admission: EM | Admit: 2021-08-27 | Discharge: 2021-08-27 | Disposition: A | Discharge status: Home / Self Care | Payer: Medicaid Other | Attending: Family Medicine | Admitting: Family Medicine

## 2021-08-27 ENCOUNTER — Encounter (HOSPITAL_COMMUNITY): Payer: Self-pay | Admitting: *Deleted

## 2021-08-27 DIAGNOSIS — R21 Rash and other nonspecific skin eruption: Secondary | ICD-10-CM | POA: Diagnosis not present

## 2021-08-27 DIAGNOSIS — Z760 Encounter for issue of repeat prescription: Secondary | ICD-10-CM | POA: Diagnosis not present

## 2021-08-27 MED ORDER — SERTRALINE HCL 50 MG PO TABS: 50.0000 mg | ORAL_TABLET | Freq: Every day | ORAL | 0 refills | Status: DC

## 2021-08-27 MED ORDER — PREDNISONE 50 MG PO TABS: 50.0000 mg | ORAL_TABLET | Freq: Every day | ORAL | 0 refills | Status: AC

## 2021-08-27 MED ORDER — TRIAMCINOLONE ACETONIDE 0.5 % EX OINT: 1.0000 | TOPICAL_OINTMENT | Freq: Two times a day (BID) | CUTANEOUS | 0 refills | Status: AC

## 2021-08-27 MED ORDER — HYDROXYZINE PAMOATE 25 MG PO CAPS: 25.0000 mg | ORAL_CAPSULE | Freq: Three times a day (TID) | ORAL | 0 refills | Status: DC | PRN

## 2021-08-27 MED ORDER — LISINOPRIL-HYDROCHLOROTHIAZIDE 10-12.5 MG PO TABS: 1.0000 | ORAL_TABLET | Freq: Every day | ORAL | 0 refills | Status: DC

## 2021-08-27 MED ORDER — FLUTICASONE PROPIONATE 50 MCG/ACT NA SUSP: 1.0000 | Freq: Every day | NASAL | 0 refills | Status: DC

## 2021-08-27 NOTE — Discharge Instructions (Signed)
You were seen today for a rash.  Given this is very itchy for you I have sent out a steroid ointment, as well as oral prednisone to take daily x 5 days.  I have also refilled your medications for you.  Please wash your clothes and bedding in hot/warm water separately.  Please return if not improving as anticipated.

## 2021-08-27 NOTE — ED Provider Notes (Signed)
[place]    CSN: [id number] Arrival date & time: 08/27/21  1239      History   Chief Complaint Chief Complaint  Patient presents with   Rash    HPI Victoria Clayton is a 41 y.o. female.   Patient is here for rash on her left arm.  Going on 1-2 weeks.  Her granddaughter just hand HFM.  Started on her hand now spread under her arm.  Itches.  She did use a excema cream without much help.  It does seem to be drying up overall.   Also needs a refill of meds.  She missed an apt with her pcp but has another apt in sept.   Past Medical History:  Diagnosis Date   Abnormal Pap smear    Anemia    Aneurysmal bone cyst    stable over 5 years, saw NSG   Anxiety    Asthma    Depression    Fibroid    Infection    UTI    Patient Active Problem List   Diagnosis Date Noted   History of cocaine use 11/25/2019   Marijuana use 11/25/2019   Intramural leiomyoma of uterus 11/25/2019   Menorrhagia with irregular cycle 11/25/2019   Left inguinal hernia 01/28/2013    Past Surgical History:  Procedure Laterality Date   CESAREAN SECTION  1998, 2000, 2003   DILATION AND CURETTAGE OF UTERUS     with miscarriage   HERNIA REPAIR     TUBAL LIGATION      OB History     Gravida  7   Para  3   Term  2   Preterm  1   AB  4   Living  3      SAB  3   IAB  1   Ectopic  0   Multiple  0   Live Births  3            Home Medications    Prior to Admission medications   Medication Sig Start Date End Date Taking? Authorizing Provider  hydrOXYzine (VISTARIL) 25 MG capsule Take 1 capsule (25 mg total) by mouth 3 (three) times daily as needed. 07/01/21  Yes Lamptey, Myrene Galas, MD  lisinopril-hydrochlorothiazide (ZESTORETIC) 10-12.5 MG tablet Take 1 tablet by mouth daily. 09/16/20  Yes [provider]  fluticasone (FLONASE) 50 MCG/ACT nasal spray Place 1 spray into both nostrils daily. 07/01/21   Chase Picket, MD  sertraline (ZOLOFT) 50 MG  tablet Take 1 tablet (50 mg total) by mouth daily. 06/30/20 06/30/21  Revonda Humphrey, NP    Family History Family History  Problem Relation Age of Onset   Heart disease Mother        chf   Hypertension Mother    Fibromyalgia Mother    COPD Mother    Alcohol abuse Father    Anesthesia problems Neg Hx     Social History Social History   Tobacco Use   Smoking status: Every Day    Packs/day: 1.00    Years: 15.00    Total pack years: 15.00    Types: Cigarettes   Smokeless tobacco: Never  Vaping Use   Vaping Use: Never used  Substance Use Topics   Alcohol use: Yes    Comment: social   Drug use: Yes    Frequency: 3.0 times per week    Types: Marijuana     Allergies   Patient has no active allergies.  Review of Systems Review of Systems  Constitutional: Negative.   HENT: Negative.    Respiratory: Negative.    Cardiovascular: Negative.   Gastrointestinal: Negative.   Genitourinary: Negative.   Skin:  Positive for rash.     Physical Exam Triage Vital Signs ED Triage Vitals  Enc Vitals Group     BP 08/27/21 1250 (!) 142/104     Pulse Rate 08/27/21 1250 74     Resp 08/27/21 1250 18     Temp 08/27/21 1250 98.3 F (36.8 C)     Temp Source 08/27/21 1250 Oral     SpO2 08/27/21 1250 97 %     Weight --      Height --      Head Circumference --      Peak Flow --      Pain Score 08/27/21 1248 0     Pain Loc --      Pain Edu? --      Excl. in [place]? --    No data found.  Updated Vital Signs BP (!) 142/104 (BP Location: Right Arm)   Pulse 74   Temp 98.3 F (36.8 C) (Oral)   Resp 18   LMP 08/26/2021   SpO2 97%   Visual Acuity Right Eye Distance:   Left Eye Distance:   Bilateral Distance:    Right Eye Near:   Left Eye Near:    Bilateral Near:     Physical Exam Constitutional:      Appearance: Normal appearance.  Skin:    Comments: At the back of the left hand is a patch of slightly raised erythematous, dry rash;  scattered up the left medial arm  are small circular patches of dry, scaly, red rash;  patch on the left breast that appears similar  Neurological:     General: No focal deficit present.     Mental Status: She is alert.  Psychiatric:        Mood and Affect: Mood normal.      UC Treatments / Results  Labs (all labs ordered are listed, but only abnormal results are displayed) Labs Reviewed - No data to display  EKG   Radiology No results found.  Procedures Procedures (including critical care time)  Medications Ordered in UC Medications - No data to display  Initial Impression / Assessment and Plan / UC Course  I have reviewed the triage vital signs and the nursing notes.  Pertinent labs & imaging results that were available during my care of the patient were reviewed by me and considered in my medical decision making (see chart for details).    Final Clinical Impressions(s) / UC Diagnoses   Final diagnoses:  Rash  Encounter for medication refill     Discharge Instructions      You were seen today for a rash.  Given this is very itchy for you I have sent out a steroid ointment, as well as oral prednisone to take daily x 5 days.  I have also refilled your medications for you.  Please wash your clothes and bedding in hot/warm water separately.  Please return if not improving as anticipated.     ED Prescriptions     Medication Sig Dispense Auth. Provider   fluticasone (FLONASE) 50 MCG/ACT nasal spray Place 1 spray into both nostrils daily. 16 g Berlyn Saylor, MD   sertraline (ZOLOFT) 50 MG tablet  (Status: Discontinued) Take 1 tablet (50 mg total) by mouth daily. 30 tablet Rondel Oh, MD  lisinopril-hydrochlorothiazide (ZESTORETIC) 10-12.5 MG tablet Take 1 tablet by mouth daily. 30 tablet Lacrecia Delval, MD   hydrOXYzine (VISTARIL) 25 MG capsule Take 1 capsule (25 mg total) by mouth 3 (three) times daily as needed. 90 capsule Omara Alcon, MD   triamcinolone ointment (KENALOG) 0.5 % Apply 1  Application topically 2 (two) times daily. 30 g Juley Giovanetti, MD   predniSONE (DELTASONE) 50 MG tablet Take 1 tablet (50 mg total) by mouth daily for 5 days. 5 tablet Ryenn Howeth, MD   sertraline (ZOLOFT) 50 MG tablet Take 1 tablet (50 mg total) by mouth daily. 30 tablet Rondel Oh, MD      PDMP not reviewed this encounter.   Rondel Oh, MD 08/27/21 1308

## 2021-08-27 NOTE — ED Triage Notes (Signed)
Pt states that her granddaughter had HFM recently and not she has a rash on left arm and hand. She tried granddaughter eczema cream without relief.    She would like refills of flonase, zoloft, and hydroxyzine also her BP med.  until she can get into her PCP office for end September.

## 2021-11-19 ENCOUNTER — Other Ambulatory Visit: Payer: Self-pay

## 2021-11-19 ENCOUNTER — Encounter (HOSPITAL_COMMUNITY): Payer: Self-pay | Admitting: *Deleted

## 2021-11-19 ENCOUNTER — Ambulatory Visit (HOSPITAL_COMMUNITY)
Admission: EM | Admit: 2021-11-19 | Discharge: 2021-11-19 | Disposition: A | Discharge status: Home / Self Care | Payer: Medicaid Other | Attending: Internal Medicine | Admitting: Internal Medicine

## 2021-11-19 DIAGNOSIS — J039 Acute tonsillitis, unspecified: Secondary | ICD-10-CM | POA: Diagnosis not present

## 2021-11-19 DIAGNOSIS — I10 Essential (primary) hypertension: Secondary | ICD-10-CM | POA: Diagnosis not present

## 2021-11-19 DIAGNOSIS — J029 Acute pharyngitis, unspecified: Secondary | ICD-10-CM | POA: Diagnosis present

## 2021-11-19 LAB — POCT RAPID STREP A, ED / UC: Streptococcus, Group A Screen (Direct): NEGATIVE

## 2021-11-19 MED ORDER — FLUTICASONE PROPIONATE 50 MCG/ACT NA SUSP: 1.0000 | Freq: Every day | NASAL | 2 refills | Status: DC

## 2021-11-19 MED ORDER — PREDNISONE 20 MG PO TABS: 40.0000 mg | ORAL_TABLET | Freq: Every day | ORAL | 0 refills | Status: AC

## 2021-11-19 NOTE — ED Provider Notes (Signed)
[place]    CSN: [id number] Arrival date & time: 11/19/21  1735      History   Chief Complaint Chief Complaint  Patient presents with   Sore Throat    HPI Victoria Clayton is a 41 y.o. female.   41 year old female presents with sore throat, swollen tonsils and difficulty swallowing for the past 2 days. Having some drainage but no sinus pressure or coughing. No fever or GI symptoms. No known exposure to strep. Recent exposure to RSV. Has been trying Hall's throat lozenges with minimal relief. Does have a history of recurrent strep. Other chronic health issues include HTN, nicotine dependence, environmental allergies, asthma, anxiety and depression. Currently on Zestoretic with variable control of blood pressure, and Zoloft daily and Vistaril prn. Requests refill of Flonase until she can see her PCP again.   The history is provided by the patient.    Past Medical History:  Diagnosis Date   Abnormal Pap smear    Anemia    Aneurysmal bone cyst    stable over 5 years, saw NSG   Anxiety    Asthma    Depression    Fibroid    Infection    UTI    Patient Active Problem List   Diagnosis Date Noted   History of cocaine use 11/25/2019   Marijuana use 11/25/2019   Intramural leiomyoma of uterus 11/25/2019   Menorrhagia with irregular cycle 11/25/2019   Left inguinal hernia 01/28/2013    Past Surgical History:  Procedure Laterality Date   CESAREAN SECTION  1998, 2000, 2003   DILATION AND CURETTAGE OF UTERUS     with miscarriage   HERNIA REPAIR     TUBAL LIGATION      OB History     Gravida  7   Para  3   Term  2   Preterm  1   AB  4   Living  3      SAB  3   IAB  1   Ectopic  0   Multiple  0   Live Births  3            Home Medications    Prior to Admission medications   Medication Sig Start Date End Date Taking? Authorizing Provider  predniSONE (DELTASONE) 20 MG tablet Take 2 tablets (40 mg total) by mouth daily for  5 days. 11/19/21 11/24/21 Yes Leon Montoya, Nicholes Stairs, NP  fluticasone (FLONASE) 50 MCG/ACT nasal spray Place 1 spray into both nostrils daily. 11/19/21   Katy Apo, NP  hydrOXYzine (VISTARIL) 25 MG capsule Take 1 capsule (25 mg total) by mouth 3 (three) times daily as needed. 08/27/21   Piontek, Junie Panning, MD  lisinopril-hydrochlorothiazide (ZESTORETIC) 10-12.5 MG tablet Take 1 tablet by mouth daily. 08/27/21   Piontek, Junie Panning, MD  sertraline (ZOLOFT) 50 MG tablet Take 1 tablet (50 mg total) by mouth daily. 08/27/21 09/26/21  Piontek, Junie Panning, MD  triamcinolone ointment (KENALOG) 0.5 % Apply 1 Application topically 2 (two) times daily. 08/27/21   Rondel Oh, MD    Family History Family History  Problem Relation Age of Onset   Heart disease Mother        chf   Hypertension Mother    Fibromyalgia Mother    COPD Mother    Alcohol abuse Father    Anesthesia problems Neg Hx     Social History Social History   Tobacco Use   Smoking status: Every Day  Packs/day: 1.00    Years: 15.00    Total pack years: 15.00    Types: Cigarettes   Smokeless tobacco: Never  Vaping Use   Vaping Use: Never used  Substance Use Topics   Alcohol use: Yes    Comment: social   Drug use: Yes    Frequency: 3.0 times per week    Types: Marijuana     Allergies   Patient has no active allergies.   Review of Systems Review of Systems  Constitutional:  Positive for fatigue. Negative for activity change, appetite change, chills, diaphoresis and fever.  HENT:  Positive for postnasal drip, sore throat and trouble swallowing. Negative for congestion, ear pain, mouth sores, sinus pressure and sinus pain.   Eyes:  Negative for discharge, redness and itching.  Respiratory:  Negative for cough and shortness of breath.   Gastrointestinal:  Negative for nausea and vomiting.  Musculoskeletal:  Negative for arthralgias, myalgias, neck pain and neck stiffness.  Skin:  Negative for color change and rash.   Allergic/Immunologic: Positive for environmental allergies. Negative for food allergies and immunocompromised state.  Neurological:  Negative for dizziness, tremors, seizures, syncope, facial asymmetry, speech difficulty, weakness, light-headedness and numbness.  Hematological:  Positive for adenopathy. Does not bruise/bleed easily.     Physical Exam Triage Vital Signs ED Triage Vitals  Enc Vitals Group     BP 11/19/21 1913 (!) 152/103     Pulse Rate 11/19/21 1913 96     Resp 11/19/21 1913 20     Temp 11/19/21 1913 98 F (36.7 C)     Temp src --      SpO2 11/19/21 1913 96 %     Weight --      Height --      Head Circumference --      Peak Flow --      Pain Score 11/19/21 1911 9     Pain Loc --      Pain Edu? --      Excl. in [place]? --    No data found.  Updated Vital Signs BP (!) 152/103   Pulse 96   Temp 98 F (36.7 C)   Resp 20   LMP 11/12/2021   SpO2 96%   Visual Acuity Right Eye Distance:   Left Eye Distance:   Bilateral Distance:    Right Eye Near:   Left Eye Near:    Bilateral Near:     Physical Exam Vitals and nursing note reviewed.  Constitutional:      General: She is awake. She is not in acute distress.    Appearance: She is well-developed and well-groomed.     Comments: She is sitting on the exam table in no acute distress but appears tired.   HENT:     Head: Normocephalic and atraumatic.     Right Ear: Hearing, tympanic membrane, ear canal and external ear normal.     Left Ear: Hearing, tympanic membrane, ear canal and external ear normal.     Nose: Nose normal. No congestion.     Right Sinus: No maxillary sinus tenderness or frontal sinus tenderness.     Left Sinus: No maxillary sinus tenderness or frontal sinus tenderness.     Mouth/Throat:     Lips: Pink.     Mouth: Mucous membranes are moist. No oral lesions.     Pharynx: Uvula midline. Pharyngeal swelling and posterior oropharyngeal erythema present. No oropharyngeal exudate or uvula  swelling.     Tonsils:  No tonsillar exudate or tonsillar abscesses. 3+ on the right. 4+ on the left.     Comments: Tonsillar swelling and redness, left greater than right. Minimal exudate. No distinct tonsillar abscess present.  Eyes:     Extraocular Movements: Extraocular movements intact.     Conjunctiva/sclera: Conjunctivae normal.  Cardiovascular:     Rate and Rhythm: Normal rate and regular rhythm.     Heart sounds: Normal heart sounds. No murmur heard. Pulmonary:     Effort: Pulmonary effort is normal. No respiratory distress.     Breath sounds: Normal breath sounds and air entry. No decreased air movement. No decreased breath sounds, wheezing, rhonchi or rales.  Musculoskeletal:     Cervical back: Normal range of motion and neck supple.  Lymphadenopathy:     Cervical: Cervical adenopathy present.  Skin:    General: Skin is warm and dry.     Capillary Refill: Capillary refill takes less than 2 seconds.     Findings: No rash.  Neurological:     General: No focal deficit present.     Mental Status: She is alert and oriented to person, place, and time.  Psychiatric:        Mood and Affect: Mood normal.        Behavior: Behavior is cooperative.        Thought Content: Thought content normal.        Judgment: Judgment normal.      UC Treatments / Results  Labs (all labs ordered are listed, but only abnormal results are displayed) Labs Reviewed  CULTURE, GROUP A STREP [facility])  POCT RAPID STREP A, ED / UC    EKG   Radiology No results found.  Procedures Procedures (including critical care time)  Medications Ordered in UC Medications - No data to display  Initial Impression / Assessment and Plan / UC Course  I have reviewed the triage vital signs and the nursing notes.  Pertinent labs & imaging results that were available during my care of the patient were reviewed by me and considered in my medical decision making (see chart for details).     Reviewed negative  rapid strep test result with patient. Sent specimen for throat culture. Reviewed that she probably has a viral illness. Due to significant tonsillar swelling and difficulty swallowing, may start Prednisone '40mg'$  daily for 5 days. Discussed that this medication can raise blood pressure so continue to monitor. Blood pressure is elevated today but similar to previous visits- may need adjustment of medication and encouraged to see her PCP for further evaluation. May take OTC Tylenol '1000mg'$  every 8 hours as needed. Continue to gargle with warm salt water and alternate with cool fluids as needed. Follow-up pending throat culture results and in 3 to 4 days with her PCP if not improving.    Final Clinical Impressions(s) / UC Diagnoses   Final diagnoses:  Sore throat  Tonsillitis  Elevated blood pressure reading with diagnosis of hypertension     Discharge Instructions      Your rapid strep test was negative. We are sending a culture to confirm that you do not have strep. Due to the tonsil swelling, recommend start Prednisone '40mg'$  daily for 5 days- take with food. May also take OTC Tylenol '1000mg'$  every 8 hours as needed. Continue to gargle with warm salt water and alternate with cool fluids. Follow-up pending throat culture results.     ED Prescriptions     Medication Sig Dispense Auth. Provider  fluticasone (FLONASE) 50 MCG/ACT nasal spray Place 1 spray into both nostrils daily. 16 g Katy Apo, NP   predniSONE (DELTASONE) 20 MG tablet Take 2 tablets (40 mg total) by mouth daily for 5 days. 10 tablet Geralda Baumgardner, Nicholes Stairs, NP      PDMP not reviewed this encounter.   Katy Apo, NP 11/20/21 1011

## 2021-11-19 NOTE — Discharge Instructions (Signed)
Your rapid strep test was negative. We are sending a culture to confirm that you do not have strep. Due to the tonsil swelling, recommend start Prednisone '40mg'$  daily for 5 days- take with food. May also take OTC Tylenol '1000mg'$  every 8 hours as needed. Continue to gargle with warm salt water and alternate with cool fluids. Follow-up pending throat culture results.

## 2021-11-19 NOTE — ED Triage Notes (Signed)
Pt reports for 3 days she has had swollen tonsils and reports she hs a hx of strep throat.

## 2021-11-22 LAB — CULTURE, GROUP A STREP (THRC): Special Requests: NORMAL

## 2021-11-26 ENCOUNTER — Ambulatory Visit (HOSPITAL_COMMUNITY)
Admission: EM | Admit: 2021-11-26 | Discharge: 2021-11-26 | Disposition: A | Discharge status: Home / Self Care | Payer: Medicaid Other | Attending: Emergency Medicine | Admitting: Emergency Medicine

## 2021-11-26 ENCOUNTER — Encounter (HOSPITAL_COMMUNITY): Payer: Self-pay | Admitting: Emergency Medicine

## 2021-11-26 DIAGNOSIS — H6502 Acute serous otitis media, left ear: Secondary | ICD-10-CM

## 2021-11-26 DIAGNOSIS — J039 Acute tonsillitis, unspecified: Secondary | ICD-10-CM | POA: Diagnosis not present

## 2021-11-26 MED ORDER — CEFTRIAXONE SODIUM 500 MG IJ SOLR
500.0000 mg | Freq: Once | INTRAMUSCULAR | Status: AC
  Administered 2021-11-26: 500 mg via INTRAMUSCULAR

## 2021-11-26 MED ORDER — CEFTRIAXONE SODIUM 500 MG IJ SOLR
INTRAMUSCULAR | Status: AC
  Filled 2021-11-26: qty 500

## 2021-11-26 MED ORDER — AMOXICILLIN-POT CLAVULANATE 875-125 MG PO TABS: 1.0000 | ORAL_TABLET | Freq: Two times a day (BID) | ORAL | 0 refills | Status: AC

## 2021-11-26 MED ORDER — LIDOCAINE HCL (PF) 1 % IJ SOLN
INTRAMUSCULAR | Status: AC
  Filled 2021-11-26: qty 2

## 2021-11-26 NOTE — ED Triage Notes (Signed)
Pt reports had swollen tonsils and here week ago and given steroid. Very painful to swallow. Reports couldn't really eat yesterday and had trouble sleeping last night and having to breath out of nose due to throat irritated.

## 2021-11-26 NOTE — Discharge Instructions (Signed)
On exam her throat is very red, there are no Jeannette Maddy patches and your left ear appears to be infected therefore you will need to take antibiotics to clear germs  You have been given an injection of Rocephin here in the office today to help to clear bacteria, if you are still having pain in 2 days you will need to begin Augmentin every morning and every evening for 10 days  As you have had reoccurring strep and tonsillitis it would be best if you follow-up with ear nose and throat which is the specialist for removal which will decrease your infections throughout the year    You can take Tylenol and/or Ibuprofen as needed for fever reduction and pain relief.    For sore throat: try warm salt water gargles, cepacol lozenges, throat spray, warm tea or water with lemon/honey, popsicles or ice, or OTC cold relief medicine for throat discomfort.   For congestion: take a daily anti-histamine like Zyrtec, Claritin, and a oral decongestant, such as pseudoephedrine.  You can also use Flonase 1-2 sprays in each nostril daily.   It is important to stay hydrated: drink plenty of fluids (water, gatorade/powerade/pedialyte, juices, or teas) to keep your throat moisturized and help further relieve irritation/discomfort.

## 2021-11-26 NOTE — ED Provider Notes (Signed)
[place]    CSN: [id number] Arrival date & time: 11/26/21  3419      History   Chief Complaint Chief Complaint  Patient presents with   Sore Throat    HPI Rasha L Santoni is a 41 y.o. female.   Patient presents with sore throat, swollen tonsils and pain with swallowing for 9 days.  Accompanying sore throat and a mild nonproductive cough, left-sided ear pain beginning 1 to 2 days ago.  No known sick contacts prior.  Has been tolerating food and liquids up until 1 day ago when she endorses symptoms greatly worsened.  Was evaluated 1 week ago in urgent care, strep test negative, prescribed prednisone which was somewhat helpful while taking.  History of reoccurring tonsillitis and strep.  Past Medical History:  Diagnosis Date   Abnormal Pap smear    Anemia    Aneurysmal bone cyst    stable over 5 years, saw NSG   Anxiety    Asthma    Depression    Fibroid    Infection    UTI    Patient Active Problem List   Diagnosis Date Noted   History of cocaine use 11/25/2019   Marijuana use 11/25/2019   Intramural leiomyoma of uterus 11/25/2019   Menorrhagia with irregular cycle 11/25/2019   Left inguinal hernia 01/28/2013    Past Surgical History:  Procedure Laterality Date   CESAREAN SECTION  1998, 2000, 2003   DILATION AND CURETTAGE OF UTERUS     with miscarriage   HERNIA REPAIR     TUBAL LIGATION      OB History     Gravida  7   Para  3   Term  2   Preterm  1   AB  4   Living  3      SAB  3   IAB  1   Ectopic  0   Multiple  0   Live Births  3            Home Medications    Prior to Admission medications   Medication Sig Start Date End Date Taking? Authorizing Provider  fluticasone (FLONASE) 50 MCG/ACT nasal spray Place 1 spray into both nostrils daily. 11/19/21   Katy Apo, NP  hydrOXYzine (VISTARIL) 25 MG capsule Take 1 capsule (25 mg total) by mouth 3 (three) times daily as needed. 08/27/21   Piontek, Junie Panning,  MD  lisinopril-hydrochlorothiazide (ZESTORETIC) 10-12.5 MG tablet Take 1 tablet by mouth daily. 08/27/21   Piontek, Junie Panning, MD  sertraline (ZOLOFT) 50 MG tablet Take 1 tablet (50 mg total) by mouth daily. 08/27/21 09/26/21  Piontek, Junie Panning, MD  triamcinolone ointment (KENALOG) 0.5 % Apply 1 Application topically 2 (two) times daily. 08/27/21   Rondel Oh, MD    Family History Family History  Problem Relation Age of Onset   Heart disease Mother        chf   Hypertension Mother    Fibromyalgia Mother    COPD Mother    Alcohol abuse Father    Anesthesia problems Neg Hx     Social History Social History   Tobacco Use   Smoking status: Every Day    Packs/day: 1.00    Years: 15.00    Total pack years: 15.00    Types: Cigarettes   Smokeless tobacco: Never  Vaping Use   Vaping Use: Never used  Substance Use Topics   Alcohol use: Yes    Comment: social  Drug use: Yes    Frequency: 3.0 times per week    Types: Marijuana     Allergies   Patient has no known allergies.   Review of Systems Review of Systems  Constitutional: Negative.   HENT:  Positive for ear pain and sore throat. Negative for congestion, dental problem, drooling, ear discharge, facial swelling, hearing loss, mouth sores, nosebleeds, postnasal drip, rhinorrhea, sinus pressure, sinus pain, sneezing, tinnitus, trouble swallowing and voice change.   Respiratory:  Positive for cough. Negative for apnea, choking, chest tightness, shortness of breath, wheezing and stridor.   Cardiovascular: Negative.   Gastrointestinal: Negative.   Skin: Negative.      Physical Exam Triage Vital Signs ED Triage Vitals  Enc Vitals Group     BP 11/26/21 0844 (!) 161/93     Pulse Rate 11/26/21 0844 86     Resp 11/26/21 0844 20     Temp 11/26/21 0844 97.6 F (36.4 C)     Temp Source 11/26/21 0844 Oral     SpO2 11/26/21 0844 100 %     Weight --      Height --      Head Circumference --      Peak Flow --      Pain Score  11/26/21 0843 9     Pain Loc --      Pain Edu? --      Excl. in [place]? --    No data found.  Updated Vital Signs BP (!) 161/93 (BP Location: Right Arm)   Pulse 86   Temp 97.6 F (36.4 C) (Oral)   Resp 20   LMP 11/12/2021   SpO2 100%   Visual Acuity Right Eye Distance:   Left Eye Distance:   Bilateral Distance:    Right Eye Near:   Left Eye Near:    Bilateral Near:     Physical Exam Constitutional:      Appearance: She is well-developed.  HENT:     Head: Normocephalic.     Right Ear: Tympanic membrane and ear canal normal.     Left Ear: Ear canal normal. Tympanic membrane is erythematous.     Nose: Congestion and rhinorrhea present.     Mouth/Throat:     Mouth: Mucous membranes are moist.     Pharynx: Posterior oropharyngeal erythema present.     Tonsils: No tonsillar exudate. 1+ on the right. 1+ on the left.  Cardiovascular:     Rate and Rhythm: Normal rate and regular rhythm.     Heart sounds: Normal heart sounds.  Pulmonary:     Effort: Pulmonary effort is normal.     Breath sounds: Normal breath sounds.  Musculoskeletal:     Cervical back: Normal range of motion and neck supple.  Skin:    General: Skin is warm and dry.  Neurological:     General: No focal deficit present.     Mental Status: She is alert and oriented to person, place, and time.  Psychiatric:        Mood and Affect: Mood normal.        Behavior: Behavior normal.      UC Treatments / Results  Labs (all labs ordered are listed, but only abnormal results are displayed) Labs Reviewed - No data to display  EKG   Radiology No results found.  Procedures Procedures (including critical care time)  Medications Ordered in UC Medications - No data to display  Initial Impression / Assessment and Plan / UC  Course  I have reviewed the triage vital signs and the nursing notes.  Pertinent labs & imaging results that were available during my care of the patient were reviewed by me and  considered in my medical decision making (see chart for details).  Nonrecurrent acute serous otitis media of left ear, acute tonsillitis  Vital signs are stable patient is in no signs of distress nor toxic appearing, erythema and tonsillar adenopathy is noted to the oropharynx without exudate, erythema is present to the left tympanic membrane and congestion within the nasal turbinates, discussed findings with patient patient endorses that she has had success with a antibiotic injection and that typically clear symptoms, discussed that she now has a ear infection which typically requires a 18 course oral antibiotic, would like to move forward with using just an injection, Augmentin sent to pharmacy and discussed with patient that she will most likely need to take oral medicine and that if her symptoms have not improved within a 48-hour window she will need to start medicine, verbalized understanding and agreed to do so, may use additional over-the-counter medications as needed for supportive care with follow-up with urgent care as needed Final Clinical Impressions(s) / UC Diagnoses   Final diagnoses:  None   Discharge Instructions   None    ED Prescriptions   None    PDMP not reviewed this encounter.   Hans Eden, [place] 11/26/21 [phone]

## 2022-03-15 ENCOUNTER — Encounter (HOSPITAL_COMMUNITY): Payer: Self-pay

## 2022-03-15 ENCOUNTER — Ambulatory Visit (HOSPITAL_COMMUNITY)
Admission: RE | Admit: 2022-03-15 | Discharge: 2022-03-15 | Disposition: A | Discharge status: Home / Self Care | Payer: Medicaid Other | Source: Ambulatory Visit

## 2022-03-15 ENCOUNTER — Emergency Department (HOSPITAL_COMMUNITY)
Admission: EM | Admit: 2022-03-15 | Discharge: 2022-03-15 | Disposition: A | Discharge status: Home / Self Care | Payer: Medicaid Other | Attending: Emergency Medicine | Admitting: Emergency Medicine

## 2022-03-15 ENCOUNTER — Emergency Department (HOSPITAL_BASED_OUTPATIENT_CLINIC_OR_DEPARTMENT_OTHER): Payer: Medicaid Other

## 2022-03-15 ENCOUNTER — Other Ambulatory Visit: Payer: Self-pay

## 2022-03-15 VITALS — BP 160/97 | HR 73 | Temp 98.5°F | Resp 18

## 2022-03-15 DIAGNOSIS — R52 Pain, unspecified: Secondary | ICD-10-CM

## 2022-03-15 DIAGNOSIS — S8391XA Sprain of unspecified site of right knee, initial encounter: Secondary | ICD-10-CM

## 2022-03-15 DIAGNOSIS — M7989 Other specified soft tissue disorders: Secondary | ICD-10-CM

## 2022-03-15 DIAGNOSIS — J45909 Unspecified asthma, uncomplicated: Secondary | ICD-10-CM | POA: Diagnosis not present

## 2022-03-15 DIAGNOSIS — M79604 Pain in right leg: Secondary | ICD-10-CM

## 2022-03-15 DIAGNOSIS — X501XXA Overexertion from prolonged static or awkward postures, initial encounter: Secondary | ICD-10-CM | POA: Insufficient documentation

## 2022-03-15 DIAGNOSIS — Z8542 Personal history of malignant neoplasm of other parts of uterus: Secondary | ICD-10-CM | POA: Insufficient documentation

## 2022-03-15 DIAGNOSIS — Z79899 Other long term (current) drug therapy: Secondary | ICD-10-CM | POA: Diagnosis not present

## 2022-03-15 DIAGNOSIS — I10 Essential (primary) hypertension: Secondary | ICD-10-CM | POA: Diagnosis not present

## 2022-03-15 DIAGNOSIS — F17210 Nicotine dependence, cigarettes, uncomplicated: Secondary | ICD-10-CM | POA: Insufficient documentation

## 2022-03-15 DIAGNOSIS — S8991XA Unspecified injury of right lower leg, initial encounter: Secondary | ICD-10-CM | POA: Diagnosis present

## 2022-03-15 HISTORY — DX: Essential (primary) hypertension: I10

## 2022-03-15 MED ORDER — LISINOPRIL-HYDROCHLOROTHIAZIDE 10-12.5 MG PO TABS: 1.0000 | ORAL_TABLET | Freq: Every day | ORAL | 0 refills | Status: DC

## 2022-03-15 MED ORDER — ACETAMINOPHEN 325 MG PO TABS
650.0000 mg | ORAL_TABLET | Freq: Once | ORAL | Status: AC
  Administered 2022-03-15: 650 mg via ORAL
  Filled 2022-03-15: qty 2

## 2022-03-15 MED ORDER — TRIAMCINOLONE ACETONIDE 0.5 % EX OINT: 1.0000 | TOPICAL_OINTMENT | Freq: Two times a day (BID) | CUTANEOUS | 0 refills | Status: AC | PRN

## 2022-03-15 MED ORDER — IBUPROFEN 600 MG PO TABS: 600.0000 mg | ORAL_TABLET | Freq: Three times a day (TID) | ORAL | 0 refills | Status: DC | PRN

## 2022-03-15 MED ORDER — ACETAMINOPHEN 325 MG PO TABS: 650.0000 mg | ORAL_TABLET | Freq: Four times a day (QID) | ORAL | 0 refills | Status: AC | PRN

## 2022-03-15 MED ORDER — METHOCARBAMOL 500 MG PO TABS: 1000.0000 mg | ORAL_TABLET | Freq: Two times a day (BID) | ORAL | 0 refills | Status: AC

## 2022-03-15 MED ORDER — FLUTICASONE PROPIONATE 50 MCG/ACT NA SUSP: 1.0000 | Freq: Every day | NASAL | 0 refills | Status: DC

## 2022-03-15 MED ORDER — HYDROXYZINE HCL 25 MG PO TABS: 25.0000 mg | ORAL_TABLET | Freq: Three times a day (TID) | ORAL | 0 refills | Status: DC | PRN

## 2022-03-15 MED ORDER — SERTRALINE HCL 50 MG PO TABS: 50.0000 mg | ORAL_TABLET | Freq: Every day | ORAL | 0 refills | Status: DC

## 2022-03-15 NOTE — ED Triage Notes (Signed)
Pt sent from urgent care to r/o blood clot in R leg; endorses L foot swelling x 1 month, R leg pain; denies injury

## 2022-03-15 NOTE — Progress Notes (Signed)
Orthopedic Tech Progress Note Patient Details:  ALIZZA BOPP Feb 27, 1980 [id number]  Ortho Devices Type of Ortho Device: Knee Sleeve Ortho Device/Splint Location: right knee Ortho Device/Splint Interventions: Ordered, Application, Adjustment   Post Interventions Patient Tolerated: Well Instructions Provided: Adjustment of device, Care of device  Jaydn Fincher OTR/L 03/15/2022, 6:19 PM

## 2022-03-15 NOTE — ED Triage Notes (Addendum)
Pt presents with complaints of right leg and foot swelling x 1 month. Pt also endorses of blood pressure being very high (160/129). Pt is having pain in her right leg as well, denies any injury. Reports feet are itchy at night time.   Pt reports she is out of her medication for panic attacks and her nasal spray.

## 2022-03-15 NOTE — ED Provider Triage Note (Signed)
Emergency Medicine Provider Triage Evaluation Note  Victoria Clayton , a 42 y.o. female  was evaluated in triage.  Pt complains of lower leg swelling and pain.  She reports that she was seen at urgent care earlier today and was advised to come to the emergency department to rule out a possible blood clot.  She reports that she has noticed some swelling in the left leg and pain behind the right knee.  Currently on blood thinners no prior history of clots.  Denies any chest pain, shortness of breath..  Review of Systems  Positive: As above Negative: As above  Physical Exam  BP (!) 188/115 (BP Location: Right Arm)   Pulse 90   Temp 98.2 F (36.8 C)   Resp 18   Ht '5\' 7"'$  (1.702 m)   Wt 127 kg   LMP 02/28/2022   SpO2 98%   BMI 43.85 kg/m  Gen:   Awake, no distress  Resp:  Normal effort  MSK:   Moves extremities without difficulty  Other:  TTP in posterior popliteal region of the right knee, some mild swelling bilaterally with +1 pitting edema  Medical Decision Making  Medically screening exam initiated at 2:56 PM.  Appropriate orders placed.  Victoria Clayton was informed that the remainder of the evaluation will be completed by another provider, this initial triage assessment does not replace that evaluation, and the importance of remaining in the ED until their evaluation is complete.     Luvenia Heller, PA-C 03/15/22 1458

## 2022-03-15 NOTE — ED Notes (Signed)
Patient denies pain and is resting comfortably.  

## 2022-03-15 NOTE — Discharge Instructions (Addendum)
It was a pleasure caring for you today in the emergency department.  Please return to the emergency department for any worsening or worrisome symptoms.  Please use compression stockings daily, wear knee brace daily for next 10 days.

## 2022-03-15 NOTE — ED Notes (Signed)
Patient is being discharged from the Urgent Care and sent to the Emergency Department via POV . Per Eliezer Lofts NP, patient is in need of higher level of care due to possible DVT. Patient is aware and verbalizes understanding of plan of care.  Vitals:   03/15/22 1349  BP: (!) 160/97  Pulse: 73  Resp: 18  Temp: 98.5 F (36.9 C)  SpO2: 95%

## 2022-03-15 NOTE — ED Provider Notes (Signed)
[facility] Provider Note  CSN: [id number] Arrival date & time: 03/15/22 1436  Chief Complaint(s) Leg Pain  HPI Victoria Clayton is a 42 y.o. female with past medical history as below, significant for obesity, depression, HTN who presents to the ED with complaint of leg pain and swelling.  Patient recently started a new job where she is standing for prolonged period of time.  Been having pain to her bilateral legs over the past 1 to 1.5 months.  Some swelling noticed to her left foot which is since improved.  Pain primarily to right knee and right thigh proximally.  No falls or injuries, no back pain, no numbness or tingling.  No recent medication or diet changes.  No falls.  Took Motrin at home which did significant improve her symptoms.  Tried using a knee brace but unable to get his stay in place.  Symptoms worsen with prolonged activity or working multiple days in a row, improved with rest.  History of DVT or PE, no anticoagulation.  She has history of OSA on CPAP feels her breathing is at her typical baseline.  No recent travel.  Past Medical History Past Medical History:  Diagnosis Date   Abnormal Pap smear    Anemia    Aneurysmal bone cyst    stable over 5 years, saw NSG   Anxiety    Asthma    Depression    Fibroid    Hypertension    Infection    UTI   Patient Active Problem List   Diagnosis Date Noted   History of cocaine use 11/25/2019   Marijuana use 11/25/2019   Intramural leiomyoma of uterus 11/25/2019   Menorrhagia with irregular cycle 11/25/2019   Left inguinal hernia 01/28/2013   Home Medication(s) Prior to Admission medications   Medication Sig Start Date End Date Taking? Authorizing Provider  acetaminophen (TYLENOL) 325 MG tablet Take 2 tablets (650 mg total) by mouth every 6 (six) hours as needed. 03/15/22  Yes Wynona Dove A, DO  fluticasone (FLONASE) 50 MCG/ACT nasal spray Place 1 spray into both nostrils  daily for 7 days. 03/15/22 03/22/22 Yes Jeanell Sparrow, DO  hydrOXYzine (ATARAX) 25 MG tablet Take 1 tablet (25 mg total) by mouth every 8 (eight) hours as needed. 03/15/22  Yes Wynona Dove A, DO  ibuprofen (ADVIL) 600 MG tablet Take 1 tablet (600 mg total) by mouth every 8 (eight) hours as needed (take with food). 03/15/22  Yes Jeanell Sparrow, DO  lisinopril-hydrochlorothiazide (ZESTORETIC) 10-12.5 MG tablet Take 1 tablet by mouth daily. 03/15/22 05/14/22 Yes Jeanell Sparrow, DO  methocarbamol (ROBAXIN) 500 MG tablet Take 2 tablets (1,000 mg total) by mouth 2 (two) times daily for 5 days. 03/15/22 03/20/22 Yes Wynona Dove A, DO  sertraline (ZOLOFT) 50 MG tablet Take 1 tablet (50 mg total) by mouth daily. 03/15/22 04/14/22 Yes Wynona Dove A, DO  triamcinolone ointment (KENALOG) 0.5 % Apply 1 Application topically 2 (two) times daily as needed. 03/15/22  Yes Wynona Dove A, DO  fluticasone (FLONASE) 50 MCG/ACT nasal spray Place 1 spray into both nostrils daily. 11/19/21   Katy Apo, NP  hydrOXYzine (VISTARIL) 25 MG capsule Take 1 capsule (25 mg total) by mouth 3 (three) times daily as needed. 08/27/21   Piontek, Junie Panning, MD  lisinopril-hydrochlorothiazide (ZESTORETIC) 10-12.5 MG tablet Take 1 tablet by mouth daily. 08/27/21   Piontek, Junie Panning, MD  sertraline (ZOLOFT) 50 MG tablet Take 1 tablet (50  mg total) by mouth daily. 08/27/21 09/26/21  Piontek, Junie Panning, MD  triamcinolone ointment (KENALOG) 0.5 % Apply 1 Application topically 2 (two) times daily. 08/27/21   Rondel Oh, MD                                                                                                                                    Past Surgical History Past Surgical History:  Procedure Laterality Date   CESAREAN SECTION  1998, 2000, 2003   DILATION AND CURETTAGE OF UTERUS     with miscarriage   HERNIA REPAIR     TUBAL LIGATION     Family History Family History  Problem Relation Age of Onset   Heart disease Mother        chf    Hypertension Mother    Fibromyalgia Mother    COPD Mother    Alcohol abuse Father    Anesthesia problems Neg Hx     Social History Social History   Tobacco Use   Smoking status: Every Day    Packs/day: 1.00    Years: 15.00    Total pack years: 15.00    Types: Cigarettes   Smokeless tobacco: Never  Vaping Use   Vaping Use: Never used  Substance Use Topics   Alcohol use: Yes    Comment: social   Drug use: Yes    Frequency: 3.0 times per week    Types: Marijuana   Allergies Patient has no known allergies.  Review of Systems Review of Systems  Constitutional:  Negative for activity change and fever.  HENT:  Negative for facial swelling and trouble swallowing.   Eyes:  Negative for discharge and redness.  Respiratory:  Negative for cough and shortness of breath.   Cardiovascular:  Positive for leg swelling. Negative for chest pain and palpitations.  Gastrointestinal:  Negative for abdominal pain and nausea.  Genitourinary:  Negative for dysuria and flank pain.  Musculoskeletal:  Positive for arthralgias. Negative for back pain and gait problem.  Skin:  Negative for pallor and rash.  Neurological:  Negative for syncope and headaches.    Physical Exam Vital Signs  I have reviewed the triage vital signs BP (!) 153/101   Pulse 82   Temp 98.2 F (36.8 C)   Resp 18   Ht '5\' 7"'$  (1.702 m)   Wt 127 kg   LMP 02/28/2022   SpO2 96%   BMI 43.85 kg/m  Physical Exam Vitals and nursing note reviewed.  Constitutional:      General: She is not in acute distress.    Appearance: Normal appearance. She is obese. She is not ill-appearing.  HENT:     Head: Normocephalic and atraumatic.     Right Ear: External ear normal.     Left Ear: External ear normal.     Nose: Nose normal.     Mouth/Throat:     Mouth: Mucous membranes are moist.  Eyes:     General: No scleral icterus.       Right eye: No discharge.        Left eye: No discharge.  Cardiovascular:     Rate and Rhythm:  Normal rate and regular rhythm.     Pulses: Normal pulses.     Heart sounds: Normal heart sounds.  Pulmonary:     Effort: Pulmonary effort is normal. No respiratory distress.     Breath sounds: Normal breath sounds.  Abdominal:     General: Abdomen is flat.     Tenderness: There is no abdominal tenderness.  Musculoskeletal:     Cervical back: No rigidity.     Right lower leg: No edema.     Left lower leg: No edema.       Legs:     Comments: No edema noted bilateral lower extremities  Mild TTP to medial aspect of right knee.  Mild discomfort with provocative testing.  No ligament laxity.  No swelling to popliteal fossa bilateral.  Quadricep and patellar tendons intact.  Achilles tendon intact bilateral.  DP pulses equal bilateral.   LE NVI  Skin:    General: Skin is warm and dry.     Capillary Refill: Capillary refill takes less than 2 seconds.  Neurological:     Mental Status: She is alert and oriented to person, place, and time.     GCS: GCS eye subscore is 4. GCS verbal subscore is 5. GCS motor subscore is 6.  Psychiatric:        Mood and Affect: Mood normal.        Behavior: Behavior normal.     ED Results and Treatments Labs (all labs ordered are listed, but only abnormal results are displayed) Labs Reviewed - No data to display                                                                                                                        Radiology VAS [place] LOWER EXTREMITY VENOUS (DVT) (7a-7p)  Result Date: 03/15/2022  Lower Venous DVT Study Patient Name:  AMAHYA GEITZ  Date of Exam:   03/15/2022 Medical Rec #: [id number]                Accession #:    [id number] Date of Birth: October 17, 1980                Patient Gender: F Patient Age:   75 years Exam Location:  [facility] Procedure:      VAS [place] LOWER EXTREMITY VENOUS (DVT) Referring Phys: Lourdes Sledge --------------------------------------------------------------------------------  Indications:  Bilateral pain/swelling, right > left.  Limitations: Body habitus. Comparison Study: No prior studies. Performing Technologist: Darlin Coco RDMS, RVT  Examination Guidelines: A complete evaluation includes B-mode imaging, spectral Doppler, color Doppler, and power Doppler as needed of all accessible portions of each vessel. Bilateral testing is considered an integral part of a complete examination. Limited examinations for reoccurring indications may be  performed as noted. The reflux portion of the exam is performed with the patient in reverse Trendelenburg.  +---------+---------------+---------+-----------+----------+--------------+ RIGHT    CompressibilityPhasicitySpontaneityPropertiesThrombus Aging +---------+---------------+---------+-----------+----------+--------------+ CFV      Full           Yes      Yes                                 +---------+---------------+---------+-----------+----------+--------------+ SFJ      Full                                                        +---------+---------------+---------+-----------+----------+--------------+ FV Prox  Full                                                        +---------+---------------+---------+-----------+----------+--------------+ FV Mid   Full                                                        +---------+---------------+---------+-----------+----------+--------------+ FV DistalFull                                                        +---------+---------------+---------+-----------+----------+--------------+ PFV      Full                                                        +---------+---------------+---------+-----------+----------+--------------+ POP      Full           Yes      Yes                                 +---------+---------------+---------+-----------+----------+--------------+ PTV      Full                                                         +---------+---------------+---------+-----------+----------+--------------+ PERO     Full                                                        +---------+---------------+---------+-----------+----------+--------------+ Gastroc  Full                                                        +---------+---------------+---------+-----------+----------+--------------+   +---------+---------------+---------+-----------+----------+--------------+  LEFT     CompressibilityPhasicitySpontaneityPropertiesThrombus Aging +---------+---------------+---------+-----------+----------+--------------+ CFV      Full           Yes      Yes                                 +---------+---------------+---------+-----------+----------+--------------+ SFJ      Full                                                        +---------+---------------+---------+-----------+----------+--------------+ FV Prox  Full                                                        +---------+---------------+---------+-----------+----------+--------------+ FV Mid   Full                                                        +---------+---------------+---------+-----------+----------+--------------+ FV DistalFull                                                        +---------+---------------+---------+-----------+----------+--------------+ PFV      Full                                                        +---------+---------------+---------+-----------+----------+--------------+ POP      Full           Yes      Yes                                 +---------+---------------+---------+-----------+----------+--------------+ PTV      Full                                                        +---------+---------------+---------+-----------+----------+--------------+ PERO     Full                                                         +---------+---------------+---------+-----------+----------+--------------+ Gastroc  Full                                                        +---------+---------------+---------+-----------+----------+--------------+  Summary: RIGHT: - There is no evidence of deep vein thrombosis in the lower extremity.  - No cystic structure found in the popliteal fossa.  LEFT: - There is no evidence of deep vein thrombosis in the lower extremity.  - No cystic structure found in the popliteal fossa.  *See table(s) above for measurements and observations. Electronically signed by Servando Snare MD on 03/15/2022 at 4:52:51 PM.    Final     Pertinent labs & imaging results that were available during my care of the patient were reviewed by me and considered in my medical decision making (see MDM for details).  Medications Ordered in ED Medications  acetaminophen (TYLENOL) tablet 650 mg (650 mg Oral Given 03/15/22 1618)                                                                                                                                     Procedures Procedures  (including critical care time)  Medical Decision Making / ED Course    Medical Decision Making:    LAURAN SUNDET is a 42 y.o. female with past medical history as below, significant for obesity, depression, HTN who presents to the ED with complaint of leg pain and swelling.. The complaint involves an extensive differential diagnosis and also carries with it a high risk of complications and morbidity.  Serious etiology was considered. Ddx includes but is not limited to: MSK, ligamentous injury, DVT, Baker's cyst, less likely infectious, soft tissue injury, etc.  Complete initial physical exam performed, notably the patient  was no acute distress, resting comfortably.   LE NVI Reviewed and confirmed nursing documentation for past medical history, family history, social history.  Vital signs reviewed.    Clinical Course as of  03/15/22 1758  Tue Mar 15, 2022  1612 VAS [place] LOWER EXTREMITY VENOUS (DVT) (7a-7p) [SG]    Clinical Course User Index [SG] Wynona Dove A, DO   Ultrasound negative for DVT or Baker's cyst. Increased degree of suspicion for MSK injury.  Low degree of suspicion for ligamentous injury.  Gait steady.  LE NVI Symptoms resolved after Tylenol ?knee sprain F/u sports med / pcp Will give knee sleeve, multimodal pain control for home, RICE protocol, o/p f/u.Marland Kitchen  Given refill on her home medications as she does not currently have a PCP  The patient improved significantly and was discharged in stable condition. Detailed discussions were had with the patient regarding current findings, and need for close f/u with PCP or on call doctor. The patient has been instructed to return immediately if the symptoms worsen in any way for re-evaluation. Patient verbalized understanding and is in agreement with current care plan. All questions answered prior to discharge.       Additional history obtained: -Additional history obtained from na -External records from outside source obtained and reviewed including: Chart review including previous notes, labs, imaging, consultation notes including primary care documentation, home  medications, prior labs   Lab Tests: na  EKG   EKG Interpretation  Date/Time:    Ventricular Rate:    PR Interval:    QRS Duration:   QT Interval:    QTC Calculation:   R Axis:     Text Interpretation:           Imaging Studies ordered: I ordered imaging studies including LE duplex I independently visualized the following imaging with scope of interpretation limited to determining acute life threatening conditions related to emergency care: WNL I independently visualized and interpreted imaging. I agree with the radiologist interpretation   Medicines ordered and prescription drug management: Meds ordered this encounter  Medications   acetaminophen (TYLENOL) tablet 650  mg   methocarbamol (ROBAXIN) 500 MG tablet    Sig: Take 2 tablets (1,000 mg total) by mouth 2 (two) times daily for 5 days.    Dispense:  20 tablet    Refill:  0   ibuprofen (ADVIL) 600 MG tablet    Sig: Take 1 tablet (600 mg total) by mouth every 8 (eight) hours as needed (take with food).    Dispense:  30 tablet    Refill:  0   acetaminophen (TYLENOL) 325 MG tablet    Sig: Take 2 tablets (650 mg total) by mouth every 6 (six) hours as needed.    Dispense:  36 tablet    Refill:  0   fluticasone (FLONASE) 50 MCG/ACT nasal spray    Sig: Place 1 spray into both nostrils daily for 7 days.    Dispense:  15.8 mL    Refill:  0   hydrOXYzine (ATARAX) 25 MG tablet    Sig: Take 1 tablet (25 mg total) by mouth every 8 (eight) hours as needed.    Dispense:  30 tablet    Refill:  0   lisinopril-hydrochlorothiazide (ZESTORETIC) 10-12.5 MG tablet    Sig: Take 1 tablet by mouth daily.    Dispense:  60 tablet    Refill:  0   sertraline (ZOLOFT) 50 MG tablet    Sig: Take 1 tablet (50 mg total) by mouth daily.    Dispense:  30 tablet    Refill:  0   triamcinolone ointment (KENALOG) 0.5 %    Sig: Apply 1 Application topically 2 (two) times daily as needed.    Dispense:  30 g    Refill:  0    -I have reviewed the patients home medicines and have made adjustments as needed   Consultations Obtained: na   Cardiac Monitoring: na  Social Determinants of Health:  Diagnosis or treatment significantly limited by social determinants of health: obesity no current pcp   Reevaluation: After the interventions noted above, I reevaluated the patient and found that they have resolved  Co morbidities that complicate the patient evaluation  Past Medical History:  Diagnosis Date   Abnormal Pap smear    Anemia    Aneurysmal bone cyst    stable over 5 years, saw NSG   Anxiety    Asthma    Depression    Fibroid    Hypertension    Infection    UTI      Dispostion: Disposition decision  including need for hospitalization was considered, and patient discharged from emergency department.    Final Clinical Impression(s) / ED Diagnoses Final diagnoses:  Sprain of right knee, unspecified ligament, initial encounter  Right leg pain     This chart was dictated using voice recognition  software.  Despite best efforts to proofread,  errors can occur which can change the documentation meaning.    Jeanell Sparrow, DO 03/15/22 1758

## 2022-03-15 NOTE — Discharge Instructions (Addendum)
Advised patient to go to [facility] ED now for further evaluation of right posterior leg swelling and to rule out DVT.

## 2022-03-15 NOTE — ED Provider Notes (Signed)
[place]    CSN: [id number] Arrival date & time: 03/15/22  1326      History   Chief Complaint Chief Complaint  Patient presents with   Leg Swelling    HPI Victoria Clayton is a 42 y.o. female.   HPI 42 year old female presents with right leg and right foot swelling for 1 month.  Patient endorses her blood pressure has been very high averaging 160/129 and she has been having pain in her right leg as well.  Reports feet are itchy at nighttime.  PMH significant for morbid obesity asthma, pack per day cigarette smoker, and HTN.  Past Medical History:  Diagnosis Date   Abnormal Pap smear    Anemia    Aneurysmal bone cyst    stable over 5 years, saw NSG   Anxiety    Asthma    Depression    Fibroid    Hypertension    Infection    UTI    Patient Active Problem List   Diagnosis Date Noted   History of cocaine use 11/25/2019   Marijuana use 11/25/2019   Intramural leiomyoma of uterus 11/25/2019   Menorrhagia with irregular cycle 11/25/2019   Left inguinal hernia 01/28/2013    Past Surgical History:  Procedure Laterality Date   CESAREAN SECTION  1998, 2000, 2003   DILATION AND CURETTAGE OF UTERUS     with miscarriage   HERNIA REPAIR     TUBAL LIGATION      OB History     Gravida  7   Para  3   Term  2   Preterm  1   AB  4   Living  3      SAB  3   IAB  1   Ectopic  0   Multiple  0   Live Births  3            Home Medications    Prior to Admission medications   Medication Sig Start Date End Date Taking? Authorizing Provider  fluticasone (FLONASE) 50 MCG/ACT nasal spray Place 1 spray into both nostrils daily. 11/19/21   Katy Apo, NP  hydrOXYzine (VISTARIL) 25 MG capsule Take 1 capsule (25 mg total) by mouth 3 (three) times daily as needed. 08/27/21   Piontek, Junie Panning, MD  lisinopril-hydrochlorothiazide (ZESTORETIC) 10-12.5 MG tablet Take 1 tablet by mouth daily. 08/27/21   Piontek, Junie Panning, MD  sertraline (ZOLOFT)  50 MG tablet Take 1 tablet (50 mg total) by mouth daily. 08/27/21 09/26/21  Piontek, Junie Panning, MD  triamcinolone ointment (KENALOG) 0.5 % Apply 1 Application topically 2 (two) times daily. 08/27/21   Rondel Oh, MD    Family History Family History  Problem Relation Age of Onset   Heart disease Mother        chf   Hypertension Mother    Fibromyalgia Mother    COPD Mother    Alcohol abuse Father    Anesthesia problems Neg Hx     Social History Social History   Tobacco Use   Smoking status: Every Day    Packs/day: 1.00    Years: 15.00    Total pack years: 15.00    Types: Cigarettes   Smokeless tobacco: Never  Vaping Use   Vaping Use: Never used  Substance Use Topics   Alcohol use: Yes    Comment: social   Drug use: Yes    Frequency: 3.0 times per week    Types: Marijuana  Allergies   Patient has no known allergies.   Review of Systems Review of Systems   Physical Exam Triage Vital Signs ED Triage Vitals  Enc Vitals Group     BP 03/15/22 1349 (!) 160/97     Pulse Rate 03/15/22 1349 73     Resp 03/15/22 1349 18     Temp 03/15/22 1349 98.5 F (36.9 C)     Temp src --      SpO2 03/15/22 1349 95 %     Weight --      Height --      Head Circumference --      Peak Flow --      Pain Score 03/15/22 1347 7     Pain Loc --      Pain Edu? --      Excl. in [place]? --    No data found.  Updated Vital Signs BP (!) 160/97   Pulse 73   Temp 98.5 F (36.9 C)   Resp 18   LMP 02/28/2022   SpO2 95%   Physical Exam Vitals and nursing note reviewed.  Constitutional:      Appearance: She is normal weight.  HENT:     Head: Normocephalic and atraumatic.     Mouth/Throat:     Mouth: Mucous membranes are moist.     Pharynx: Oropharynx is clear.  Eyes:     Extraocular Movements: Extraocular movements intact.     Conjunctiva/sclera: Conjunctivae normal.     Pupils: Pupils are equal, round, and reactive to light.  Cardiovascular:     Rate and Rhythm: Normal rate and  regular rhythm.     Pulses: Normal pulses.     Heart sounds: Normal heart sounds. No murmur heard.    Comments: Hypertensive Pulmonary:     Effort: Pulmonary effort is normal.     Breath sounds: Normal breath sounds. No wheezing, rhonchi or rales.  Musculoskeletal:        General: Normal range of motion.     Cervical back: Normal range of motion and neck supple.     Comments: Right upper posterior leg: Moderately TTP over popliteal space/semimembranosus tendon insertion site with moderate soft tissue swelling noted, PT/DP +2 bounding  Skin:    General: Skin is warm and dry.  Neurological:     General: No focal deficit present.     Mental Status: She is alert and oriented to person, place, and time. Mental status is at baseline.      UC Treatments / Results  Labs (all labs ordered are listed, but only abnormal results are displayed) Labs Reviewed - No data to display  EKG   Radiology No results found.  Procedures Procedures (including critical care time)  Medications Ordered in UC Medications - No data to display  Initial Impression / Assessment and Plan / UC Course  I have reviewed the triage vital signs and the nursing notes.  Pertinent labs & imaging results that were available during my care of the patient were reviewed by me and considered in my medical decision making (see chart for details).     MDM: 1. Right leg swelling-advised patient to go to [facility] ED now for further evaluation of right leg swelling and to rule out DVT with ultrasound.  Patient agreed and verbalized understanding of these instructions and this plan of care this afternoon. Advised patient to go to [facility] ED now for further evaluation of right posterior leg swelling and to rule  out DVT.  Patient discharged, hemodynamically stable. Final Clinical Impressions(s) / UC Diagnoses   Final diagnoses:  Right leg swelling     Discharge Instructions      Advised patient to go to [facility] ED now for further evaluation of right posterior leg swelling and to rule out DVT.     ED Prescriptions   None    PDMP not reviewed this encounter.   Eliezer Lofts, [place] 03/15/22 1434

## 2022-05-03 ENCOUNTER — Ambulatory Visit (INDEPENDENT_AMBULATORY_CARE_PROVIDER_SITE_OTHER): Payer: Medicaid Other

## 2022-05-03 ENCOUNTER — Ambulatory Visit (HOSPITAL_COMMUNITY)
Admission: EM | Admit: 2022-05-03 | Discharge: 2022-05-03 | Disposition: A | Discharge status: Home / Self Care | Payer: Medicaid Other | Attending: Physician Assistant | Admitting: Physician Assistant

## 2022-05-03 ENCOUNTER — Encounter (HOSPITAL_COMMUNITY): Payer: Self-pay | Admitting: *Deleted

## 2022-05-03 ENCOUNTER — Other Ambulatory Visit: Payer: Self-pay

## 2022-05-03 DIAGNOSIS — M5441 Lumbago with sciatica, right side: Secondary | ICD-10-CM | POA: Diagnosis not present

## 2022-05-03 DIAGNOSIS — M5442 Lumbago with sciatica, left side: Secondary | ICD-10-CM

## 2022-05-03 MED ORDER — METHOCARBAMOL 500 MG PO TABS: 500.0000 mg | ORAL_TABLET | Freq: Two times a day (BID) | ORAL | 0 refills | Status: DC

## 2022-05-03 MED ORDER — PREDNISONE 10 MG (21) PO TBPK: ORAL_TABLET | ORAL | 0 refills | Status: DC

## 2022-05-03 MED ORDER — LIDOCAINE 5 % EX PTCH: 1.0000 | MEDICATED_PATCH | CUTANEOUS | 0 refills | Status: AC

## 2022-05-03 NOTE — Discharge Instructions (Addendum)
Your x-ray did not show anything obvious including fractures/broken bones or anything out of place.  Given you have been taking ibuprofen and it has not improved your symptoms we are going to try prednisone.  Do not take NSAIDs with this medication including aspirin, ibuprofen/Advil, naproxen/Aleve.  Take Tylenol/acetaminophen for additional pain relief.  Use lidocaine patches for pain relief.  Put these on during the day but then remove them for a minimum of 12 hours at night; use only 1 patch per 24 hours.  Take methocarbamol up to twice a day.  This will make you sleepy so do not drive or drink alcohol while taking it.  Given your intermittent symptoms I do recommend you follow-up with sports medicine; call to schedule an appointment.  If you have any worsening symptoms including increasing pain, difficulty walking, numbness or tingling on the inside of your legs, going to the bathroom on yourself without noticing it please be seen immediately.

## 2022-05-03 NOTE — ED Triage Notes (Signed)
Pt reports bil back pain . Pt reports pain radiates down both legs.

## 2022-05-03 NOTE — ED Provider Notes (Signed)
[facility]    CSN: [id number] Arrival date & time: 05/03/22  1518      History   Chief Complaint Chief Complaint  Patient presents with   Back Pain    HPI Victoria Clayton is a 42 y.o. female.   Patient presents today with a several day history of lower back pain.  She reports pain is shooting down both legs.  Pain is rated 9 on a 0-10 pain scale, described as intense aching with periodic shooting pains, localized to lumbar back with radiation into both legs, no alleviating factors identified.  She does have a history of intermittent sciatic nerve pain but has not followed by a specialist.  She has not had advanced imaging in the past.  She reports having epidurals when she was pregnant but denies any additional spinal procedures including surgery.  She denies any bowel/bladder incontinence, lower extremity weakness, saddle anesthesia.  She has been taking over-the-counter analgesics including Tylenol and ibuprofen without improvement.  She is confident that she is not pregnant.  She is having difficulty with her daily activities as result of symptoms.  Denies personal history of malignancy.    Past Medical History:  Diagnosis Date   Abnormal Pap smear    Anemia    Aneurysmal bone cyst    stable over 5 years, saw NSG   Anxiety    Asthma    Depression    Fibroid    Hypertension    Infection    UTI    Patient Active Problem List   Diagnosis Date Noted   History of cocaine use 11/25/2019   Marijuana use 11/25/2019   Intramural leiomyoma of uterus 11/25/2019   Menorrhagia with irregular cycle 11/25/2019   Left inguinal hernia 01/28/2013    Past Surgical History:  Procedure Laterality Date   CESAREAN SECTION  1998, 2000, 2003   DILATION AND CURETTAGE OF UTERUS     with miscarriage   HERNIA REPAIR     TUBAL LIGATION      OB History     Gravida  7   Para  3   Term  2   Preterm  1   AB  4   Living  3      SAB  3   IAB  1   Ectopic   0   Multiple  0   Live Births  3            Home Medications    Prior to Admission medications   Medication Sig Start Date End Date Taking? Authorizing Provider  lidocaine (LIDODERM) 5 % Place 1 patch onto the skin daily. Remove & Discard patch within 12 hours or as directed by MD 05/03/22  Yes Rathana Viveros, Noberto Retort, PA-C  methocarbamol (ROBAXIN) 500 MG tablet Take 1 tablet (500 mg total) by mouth 2 (two) times daily. 05/03/22  Yes Ananda Sitzer K, PA-C  predniSONE (STERAPRED UNI-PAK 21 TAB) 10 MG (21) TBPK tablet As directed 05/03/22  Yes Shakendra Griffeth K, PA-C  acetaminophen (TYLENOL) 325 MG tablet Take 2 tablets (650 mg total) by mouth every 6 (six) hours as needed. 03/15/22   Sloan Leiter, DO  fluticasone (FLONASE) 50 MCG/ACT nasal spray Place 1 spray into both nostrils daily. 11/19/21   Sudie Grumbling, NP  fluticasone (FLONASE) 50 MCG/ACT nasal spray Place 1 spray into both nostrils daily for 7 days. 03/15/22 03/22/22  Sloan Leiter, DO  hydrOXYzine (ATARAX) 25 MG tablet Take 1 tablet (  25 mg total) by mouth every 8 (eight) hours as needed. 03/15/22   Sloan Leiter, DO  hydrOXYzine (VISTARIL) 25 MG capsule Take 1 capsule (25 mg total) by mouth 3 (three) times daily as needed. 08/27/21   Piontek, Denny Peon, MD  lisinopril-hydrochlorothiazide (ZESTORETIC) 10-12.5 MG tablet Take 1 tablet by mouth daily. 08/27/21   Piontek, Denny Peon, MD  lisinopril-hydrochlorothiazide (ZESTORETIC) 10-12.5 MG tablet Take 1 tablet by mouth daily. 03/15/22 05/14/22  Sloan Leiter, DO  sertraline (ZOLOFT) 50 MG tablet Take 1 tablet (50 mg total) by mouth daily. 08/27/21 09/26/21  Piontek, Denny Peon, MD  sertraline (ZOLOFT) 50 MG tablet Take 1 tablet (50 mg total) by mouth daily. 03/15/22 04/14/22  Sloan Leiter, DO  triamcinolone ointment (KENALOG) 0.5 % Apply 1 Application topically 2 (two) times daily. 08/27/21   Piontek, Denny Peon, MD  triamcinolone ointment (KENALOG) 0.5 % Apply 1 Application topically 2 (two) times daily as needed.  03/15/22   Sloan Leiter, DO    Family History Family History  Problem Relation Age of Onset   Heart disease Mother        chf   Hypertension Mother    Fibromyalgia Mother    COPD Mother    Alcohol abuse Father    Anesthesia problems Neg Hx     Social History Social History   Tobacco Use   Smoking status: Every Day    Packs/day: 1.00    Years: 15.00    Additional pack years: 0.00    Total pack years: 15.00    Types: Cigarettes   Smokeless tobacco: Never  Vaping Use   Vaping Use: Never used  Substance Use Topics   Alcohol use: Yes    Comment: social   Drug use: Yes    Frequency: 3.0 times per week    Types: Marijuana     Allergies   Patient has no known allergies.   Review of Systems Review of Systems  Constitutional:  Positive for activity change. Negative for appetite change, fatigue and fever.  Gastrointestinal:  Negative for abdominal pain, diarrhea, nausea and vomiting.  Musculoskeletal:  Positive for back pain and myalgias. Negative for arthralgias.  Neurological:  Negative for weakness and numbness.     Physical Exam Triage Vital Signs ED Triage Vitals  Enc Vitals Group     BP 05/03/22 1613 (!) 143/88     Pulse Rate 05/03/22 1613 82     Resp 05/03/22 1613 20     Temp 05/03/22 1613 98.1 F (36.7 C)     Temp src --      SpO2 05/03/22 1613 92 %     Weight --      Height --      Head Circumference --      Peak Flow --      Pain Score 05/03/22 1611 9     Pain Loc --      Pain Edu? --      Excl. in GC? --    No data found.  Updated Vital Signs BP (!) 143/88   Pulse 82   Temp 98.1 F (36.7 C)   Resp 20   LMP 04/25/2022   SpO2 92%   Visual Acuity Right Eye Distance:   Left Eye Distance:   Bilateral Distance:    Right Eye Near:   Left Eye Near:    Bilateral Near:     Physical Exam Vitals reviewed.  Constitutional:      General: She is awake. She  is not in acute distress.    Appearance: Normal appearance. She is well-developed.  She is not ill-appearing.     Comments: Very pleasant female appears stated age in no acute distress sitting comfortably in exam room  HENT:     Head: Normocephalic and atraumatic.  Cardiovascular:     Rate and Rhythm: Normal rate and regular rhythm.     Heart sounds: Normal heart sounds, S1 normal and S2 normal. No murmur heard. Pulmonary:     Effort: Pulmonary effort is normal.     Breath sounds: Normal breath sounds. No wheezing, rhonchi or rales.     Comments: Clear to auscultation bilaterally Abdominal:     Palpations: Abdomen is soft.     Tenderness: There is no abdominal tenderness. There is no right CVA tenderness, left CVA tenderness, guarding or rebound.  Musculoskeletal:     Cervical back: No tenderness or bony tenderness.     Thoracic back: Tenderness present. No bony tenderness.     Lumbar back: Tenderness and bony tenderness present. Negative right straight leg raise test and negative left straight leg raise test.     Comments: Pain to percussion of lumbar vertebrae.  No deformity or step-off noted.  Tender to palpation over thoracic and lumbar paraspinal muscles.  Strength 5/5 bilateral upper and lower extremities.  Psychiatric:        Behavior: Behavior is cooperative.      UC Treatments / Results  Labs (all labs ordered are listed, but only abnormal results are displayed) Labs Reviewed - No data to display  EKG   Radiology DG Lumbar Spine Complete  Result Date: 05/03/2022 CLINICAL DATA:  Bilateral sciatica EXAM: LUMBAR SPINE - COMPLETE 4+ VIEW COMPARISON:  CT dated October 13, 2012 FINDINGS: Evaluation is limited by underpenetration. There are five non-rib bearing lumbar-type vertebral bodies. There is normal alignment. There is no evidence for acute fracture or subluxation. Intervertebral disc spaces are preserved without significant degenerative changes. Visualized abdomen is unremarkable. IMPRESSION: No high-grade degenerative changes identified within the  limitations of the exam. Electronically Signed   By: Meda Klinefelter M.D.   On: 05/03/2022 16:38    Procedures Procedures (including critical care time)  Medications Ordered in UC Medications - No data to display  Initial Impression / Assessment and Plan / UC Course  I have reviewed the triage vital signs and the nursing notes.  Pertinent labs & imaging results that were available during my care of the patient were reviewed by me and considered in my medical decision making (see chart for details).     Patient is well-appearing, afebrile, nontoxic, nontachycardic.  No alarm symptoms that would warrant emergent evaluation or imaging.  X-ray was obtained given bony tenderness and severity of pain which showed no acute osseous abnormality.  We discussed that if her symptoms are not improving she would need more advanced imaging such as MRI that we cannot arrange in urgent care.  Given she has taken NSAIDs without significant improvement of symptoms will try prednisone taper.  Discussed that she is not to take NSAIDs with this medication due to risk of GI bleeding.  She was also prescribed Robaxin up to twice a day and we discussed that this can be sedating and she is not to drive or drink alcohol with taking it.  She can use lidocaine patch for additional symptom relief.  We discussed proper use of this medication.  Encouraged her to follow-up with sports medicine for further evaluation and management including  potentially referral to physical therapy.  She was given contact information for local provider with instruction to call to schedule an appointment.  If she has any worsening or changing symptoms she needs to be seen immediately including severe pain, bowel/bladder incontinence, lower extremity weakness, saddle anesthesia.  Strict return precautions given.  Work excuse note provided.  Final Clinical Impressions(s) / UC Diagnoses   Final diagnoses:  Acute bilateral low back pain with  bilateral sciatica     Discharge Instructions      Your x-ray did not show anything obvious including fractures/broken bones or anything out of place.  Given you have been taking ibuprofen and it has not improved your symptoms we are going to try prednisone.  Do not take NSAIDs with this medication including aspirin, ibuprofen/Advil, naproxen/Aleve.  Take Tylenol/acetaminophen for additional pain relief.  Use lidocaine patches for pain relief.  Put these on during the day but then remove them for a minimum of 12 hours at night; use only 1 patch per 24 hours.  Take methocarbamol up to twice a day.  This will make you sleepy so do not drive or drink alcohol while taking it.  Given your intermittent symptoms I do recommend you follow-up with sports medicine; call to schedule an appointment.  If you have any worsening symptoms including increasing pain, difficulty walking, numbness or tingling on the inside of your legs, going to the bathroom on yourself without noticing it please be seen immediately.     ED Prescriptions     Medication Sig Dispense Auth. Provider   predniSONE (STERAPRED UNI-PAK 21 TAB) 10 MG (21) TBPK tablet As directed 21 tablet Jorgeluis Gurganus K, PA-C   lidocaine (LIDODERM) 5 % Place 1 patch onto the skin daily. Remove & Discard patch within 12 hours or as directed by MD 30 patch Yigit Norkus K, PA-C   methocarbamol (ROBAXIN) 500 MG tablet Take 1 tablet (500 mg total) by mouth 2 (two) times daily. 20 tablet Darnita Woodrum, Noberto Retort, PA-C      PDMP not reviewed this encounter.   Jeani Hawking, PA-C 05/03/22 1656

## 2022-08-03 IMAGING — US US ART/VEN ABD/PELV/SCROTUM DOPPLER LTD
1 series · 13 of 25 positions shown · non-contrast
Comparison: None available.

CLINICAL DATA: Initial evaluation for acute abdominal pain.

EXAM:
TRANSABDOMINAL AND TRANSVAGINAL ULTRASOUND OF PELVIS
DOPPLER ULTRASOUND OF OVARIES
TECHNIQUE: Both transabdominal and transvaginal ultrasound examinations of the
pelvis were performed. Transabdominal technique was performed for
global imaging of the pelvis including uterus, ovaries, adnexal
regions, and pelvic cul-de-sac.
It was necessary to proceed with endovaginal exam following the
transabdominal exam to visualize the uterus, endometrium, and
ovaries. Color and duplex Doppler ultrasound was utilized to
evaluate blood flow to the ovaries.

[Series 1: us art/ven abd/pelv/scrotum doppler ltd · 13 of 72 slices shown]
[im 1/72]
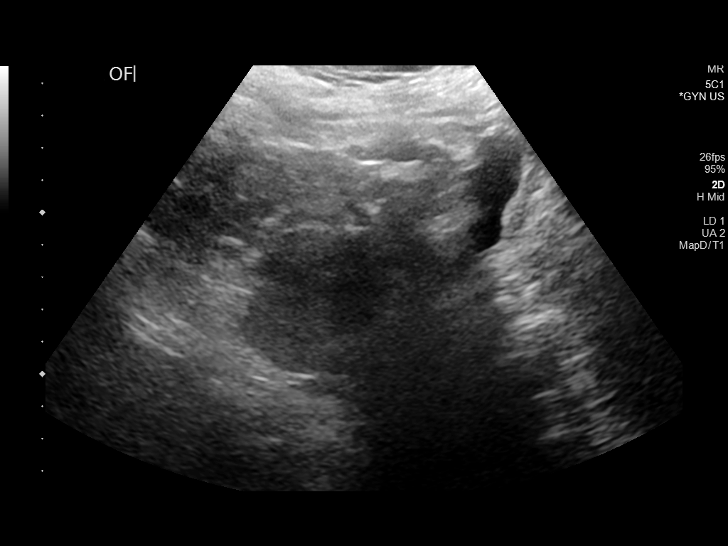
[im 6/72]
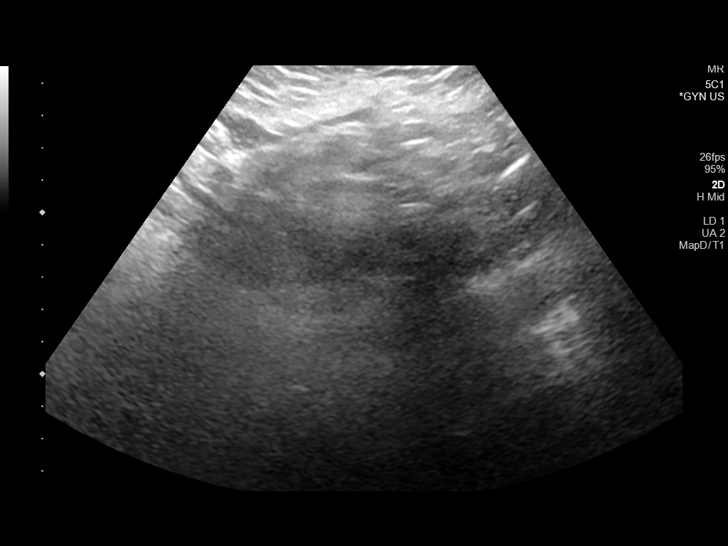
[im 12/72]
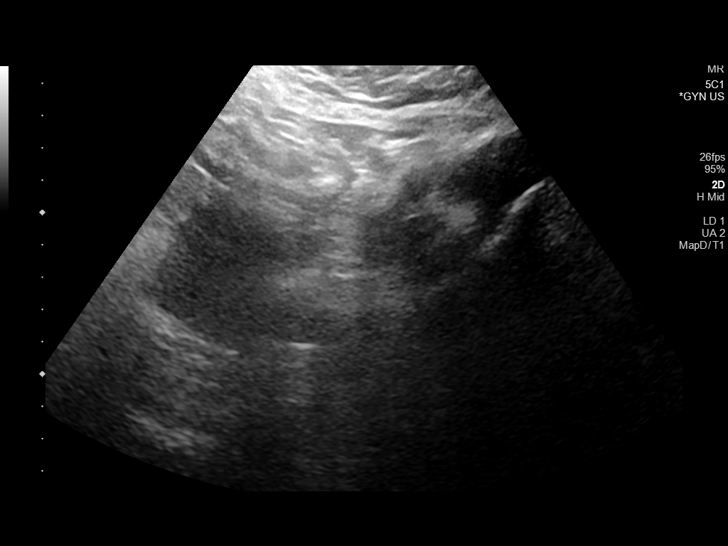
[im 18/72]
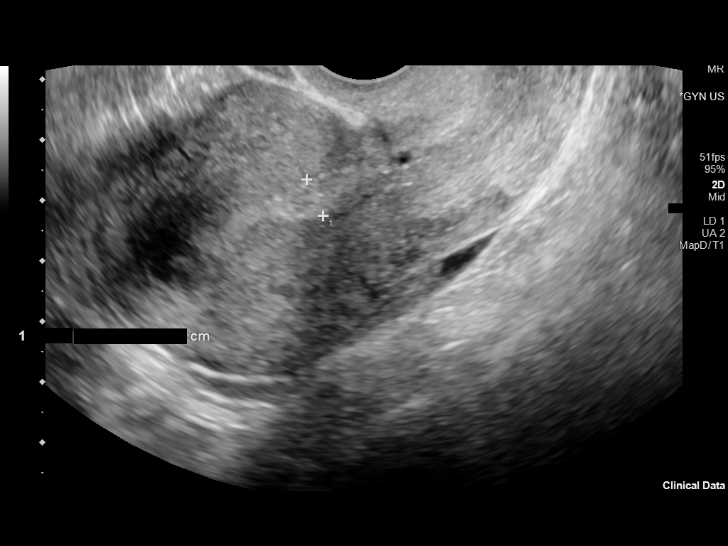
[im 24/72]
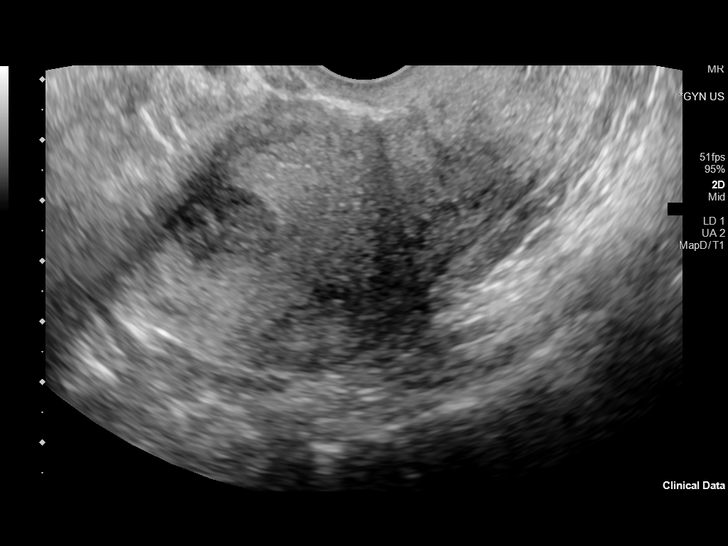
[im 30/72]
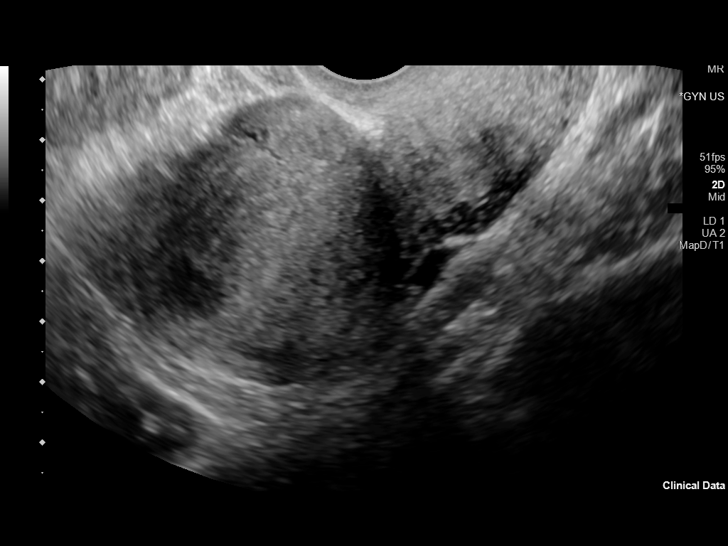
[im 36/72]
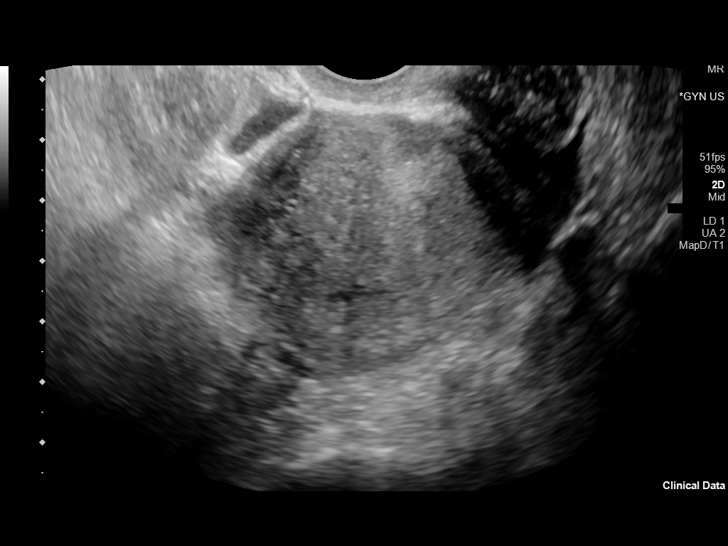
[im 42/72]
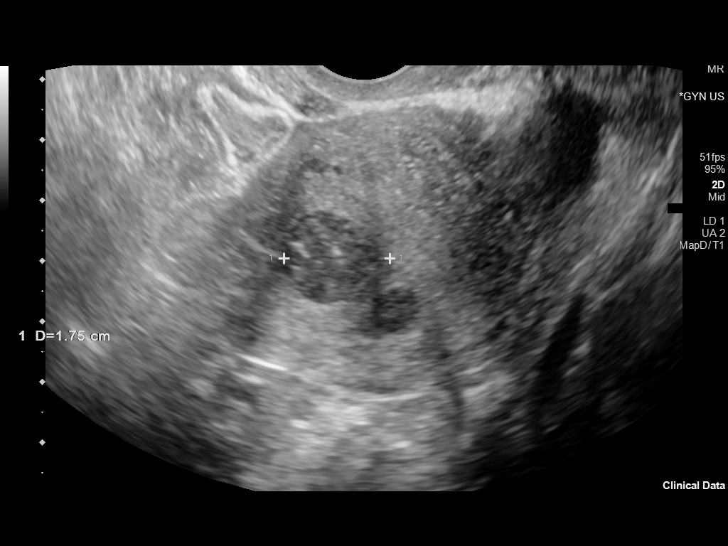
[im 48/72]
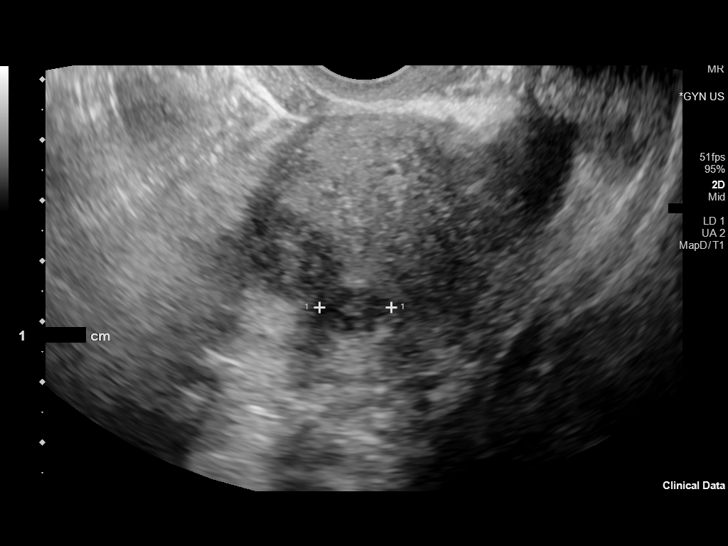
[im 54/72]
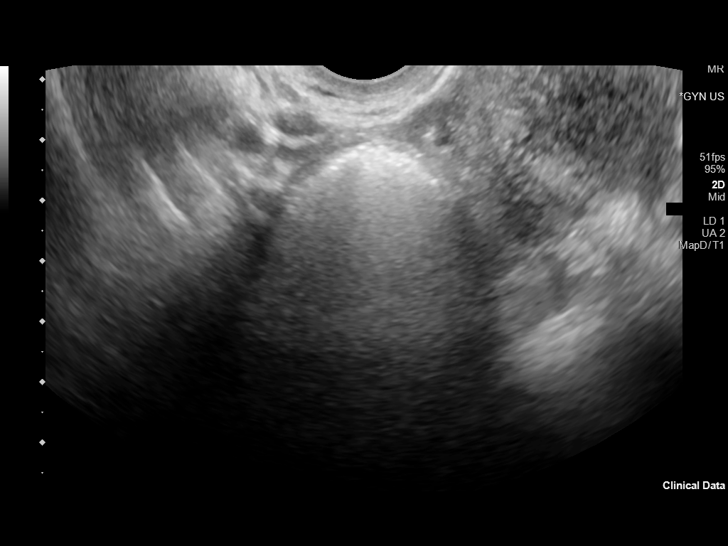
[im 60/72]
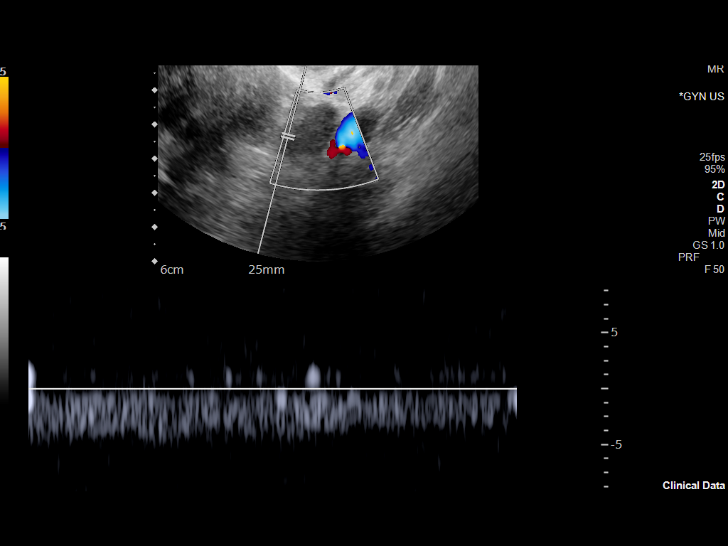
[im 66/72]
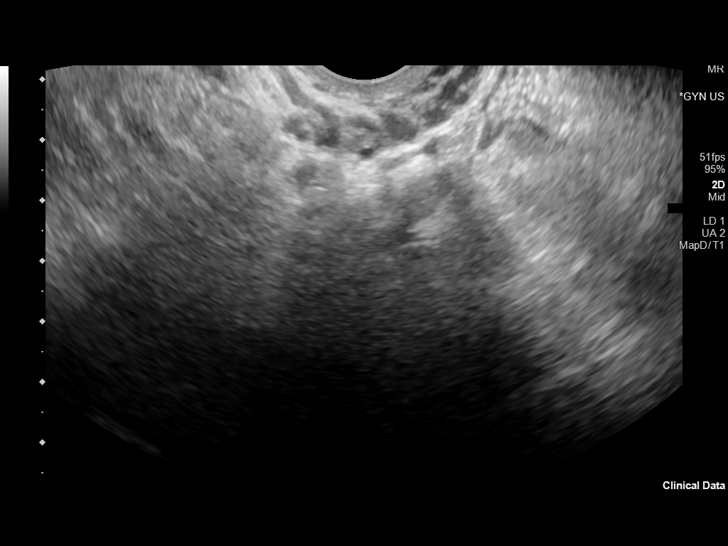
[im 72/72]
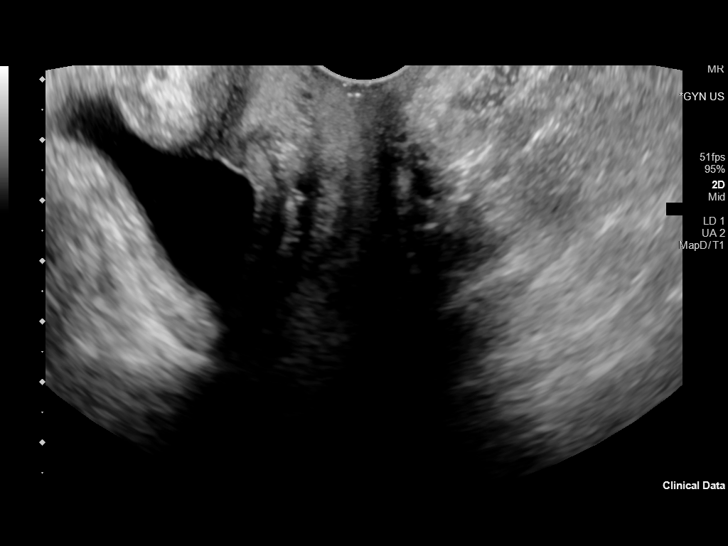

[13 of 25 positions shown; findings below may reference images not displayed]

FINDINGS: Uterus

Measurements: 8.6 x 4.8 x 5.2 cm = volume: 11 mL. Uterus is
anteverted. 1.9 x 1.5 x 1.8 cm intramural fibroid present at the
right uterine body/fundus. Additional 1.1 x 0.7 x 1.2 cm intramural
fibroid present at the mid right uterine body.

Endometrium

Thickness: 6.6 mm.  No focal abnormality visualized.

Right ovary

Not visualized.  No adnexal mass.

Left ovary

Measurements: 2.2 x 1.2 x 1.5 cm = volume: 2.1 mL. Normal
appearance/no adnexal mass.

Pulsed Doppler evaluation of the left ovary demonstrates normal
low-resistance arterial and venous waveforms.

Other findings

No abnormal free fluid.
IMPRESSION: 1. Fibroid uterus as detailed above.
2. Normal sonographic appearance of the left ovary. No evidence for
left ovarian torsion or other acute abnormality.
3. Nonvisualization of the right ovary. No adnexal mass or free
fluid within the pelvis.

## 2023-01-20 ENCOUNTER — Encounter (HOSPITAL_COMMUNITY): Payer: Self-pay | Admitting: Emergency Medicine

## 2023-01-20 ENCOUNTER — Emergency Department (HOSPITAL_COMMUNITY)
Admission: EM | Admit: 2023-01-20 | Discharge: 2023-01-21 | Disposition: A | Discharge status: Home / Self Care | Payer: Medicaid Other | Attending: Emergency Medicine | Admitting: Emergency Medicine

## 2023-01-20 ENCOUNTER — Other Ambulatory Visit: Payer: Self-pay

## 2023-01-20 DIAGNOSIS — J101 Influenza due to other identified influenza virus with other respiratory manifestations: Secondary | ICD-10-CM | POA: Diagnosis not present

## 2023-01-20 DIAGNOSIS — R519 Headache, unspecified: Secondary | ICD-10-CM | POA: Diagnosis present

## 2023-01-20 DIAGNOSIS — Z20822 Contact with and (suspected) exposure to covid-19: Secondary | ICD-10-CM | POA: Insufficient documentation

## 2023-01-20 NOTE — ED Triage Notes (Signed)
BIBA Per EMS: Pt coming from home w/ c/o flu like s/s beginning today. Grandkids dx w/ flu & norovirus. C/o congestion, cough, diarrhea.  Hx anxiety & panic attacks.

## 2023-01-21 LAB — RESP PANEL BY RT-PCR (RSV, FLU A&B, COVID)  RVPGX2
Influenza A by PCR: POSITIVE — AB
Influenza B by PCR: NEGATIVE
Resp Syncytial Virus by PCR: NEGATIVE
SARS Coronavirus 2 by RT PCR: NEGATIVE

## 2023-01-21 MED ORDER — SERTRALINE HCL 50 MG PO TABS: 50.0000 mg | ORAL_TABLET | Freq: Every day | ORAL | 0 refills | Status: DC

## 2023-01-21 MED ORDER — FLUTICASONE PROPIONATE 50 MCG/ACT NA SUSP: 1.0000 | Freq: Every day | NASAL | 2 refills | Status: DC

## 2023-01-21 MED ORDER — LISINOPRIL-HYDROCHLOROTHIAZIDE 10-12.5 MG PO TABS: 1.0000 | ORAL_TABLET | Freq: Every day | ORAL | 0 refills | Status: DC

## 2023-01-21 MED ORDER — OSELTAMIVIR PHOSPHATE 75 MG PO CAPS: 75.0000 mg | ORAL_CAPSULE | Freq: Two times a day (BID) | ORAL | 0 refills | Status: AC

## 2023-01-21 MED ORDER — ACETAMINOPHEN 500 MG PO TABS
1000.0000 mg | ORAL_TABLET | Freq: Once | ORAL | Status: AC
  Administered 2023-01-21: 1000 mg via ORAL
  Filled 2023-01-21: qty 2

## 2023-01-21 MED ORDER — DIPHENHYDRAMINE HCL 50 MG/ML IJ SOLN
25.0000 mg | Freq: Once | INTRAMUSCULAR | Status: AC
  Administered 2023-01-21: 25 mg via INTRAVENOUS
  Filled 2023-01-21: qty 1

## 2023-01-21 MED ORDER — HYDROXYZINE HCL 25 MG PO TABS: 25.0000 mg | ORAL_TABLET | Freq: Three times a day (TID) | ORAL | 0 refills | Status: DC | PRN

## 2023-01-21 MED ORDER — KETOROLAC TROMETHAMINE 30 MG/ML IJ SOLN
30.0000 mg | Freq: Once | INTRAMUSCULAR | Status: AC
  Administered 2023-01-21: 30 mg via INTRAVENOUS
  Filled 2023-01-21: qty 1

## 2023-01-21 MED ORDER — LORAZEPAM 2 MG/ML IJ SOLN
1.0000 mg | Freq: Once | INTRAMUSCULAR | Status: AC
  Administered 2023-01-21: 1 mg via INTRAVENOUS
  Filled 2023-01-21: qty 1

## 2023-01-21 MED ORDER — AMLODIPINE BESYLATE 5 MG PO TABS
5.0000 mg | ORAL_TABLET | Freq: Once | ORAL | Status: AC
  Administered 2023-01-21: 5 mg via ORAL
  Filled 2023-01-21: qty 1

## 2023-01-21 MED ORDER — METOCLOPRAMIDE HCL 5 MG/ML IJ SOLN
10.0000 mg | Freq: Once | INTRAMUSCULAR | Status: AC
  Administered 2023-01-21: 10 mg via INTRAVENOUS
  Filled 2023-01-21: qty 2

## 2023-01-21 NOTE — ED Provider Notes (Signed)
[facility] Emergency Department Provider Note MRN:  [id number]  Arrival date & time: 01/21/23     Chief Complaint   No chief complaint on file.   History of Present Illness   Victoria Clayton is a 43 y.o. year-old female presents to the ED with chief complaint of headache, generalized bodyaches, dry cough, and diarrhea.  She states that she has been around her grandchildren who have been sick with the flu.  She states that she feels like she is having a panic attack.  She denies measured fever.  She has not tried taking anything for her symptoms.  History provided by patient.   Review of Systems  Pertinent positive and negative review of systems noted in HPI.    Physical Exam   Vitals:   01/21/23 0330 01/21/23 0358  BP: (!) 155/90   Pulse: 92 98  Resp: 20   Temp:  99 F (37.2 C)  SpO2: 94% 91%    CONSTITUTIONAL:  non toxic-appearing, anxious appearing NEURO:  Alert and oriented x 3, CN 3-12 grossly intact EYES:  eyes equal and reactive ENT/NECK:  Supple, no stridor  CARDIO:  tachycardic, regular rhythm, appears well-perfused  PULM:  No respiratory distress, CTAB GI/GU:  non-distended,  MSK/SPINE:  No gross deformities, no edema, moves all extremities  SKIN:  no rash, atraumatic   *Additional and/or pertinent findings included in MDM below  Diagnostic and Interventional Summary    EKG Interpretation Date/Time:    Ventricular Rate:    PR Interval:    QRS Duration:    QT Interval:    QTC Calculation:   R Axis:      Text Interpretation:         Labs Reviewed  RESP PANEL BY RT-PCR (RSV, FLU A&B, COVID)  [id number] - Abnormal; Notable for the following components:      Result Value   Influenza A by PCR POSITIVE (*)    All other components within normal limits    No orders to display    Medications  acetaminophen (TYLENOL) tablet 1,000 mg (1,000 mg Oral Given 01/21/23 0125)  amLODipine (NORVASC) tablet 5 mg (5 mg Oral Given  01/21/23 0125)  ketorolac (TORADOL) 30 MG/ML injection 30 mg (30 mg Intravenous Given 01/21/23 0200)  metoCLOPramide (REGLAN) injection 10 mg (10 mg Intravenous Given 01/21/23 0158)  diphenhydrAMINE (BENADRYL) injection 25 mg (25 mg Intravenous Given 01/21/23 0201)  LORazepam (ATIVAN) injection 1 mg (1 mg Intravenous Given 01/21/23 0203)     Procedures  /  Critical Care Procedures  ED Course and Medical Decision Making  I have reviewed the triage vital signs, the nursing notes, and pertinent available records from the EMR.  Social Determinants Affecting Complexity of Care: Patient has no clinically significant social determinants affecting this chief complaint..   ED Course:    Medical Decision Making Patient here with flulike symptoms.  She is complaining of headache.  She also seems to be having a panic attack when I am examining her.  I was able to redirect her and have her focus on slowing down her breathing.  Will give her headache cocktail to help with her headache, this should help improve her blood pressure, but we will recheck this.  I suspect that all of her symptoms are secondary to influenza, with panic/anxiety exacerbating all of her symptoms.  Her lung sounds are clear.  She is not hypoxic.  She is in no respiratory distress.  Patient reports that she was able  to get some rest after the headache cocktail.  Blood pressure is improved.  She is in no respiratory distress.  She asks if I can refill her home medications.  I will send prescription for Tamiflu.  Recommend PCP follow-up.  Risk OTC drugs. Prescription drug management.         Consultants: No consultations were needed in caring for this patient.   Treatment and Plan: I considered admission due to patient's initial presentation, but after considering the examination and diagnostic results, patient will not require admission and can be discharged with outpatient follow-up.    Final Clinical Impressions(s) /  ED Diagnoses     ICD-10-CM   1. Influenza A  J10.1       ED Discharge Orders          Ordered    fluticasone (FLONASE) 50 MCG/ACT nasal spray  Daily        01/21/23 0352    hydrOXYzine (ATARAX) 25 MG tablet  Every 8 hours PRN        01/21/23 0352    lisinopril-hydrochlorothiazide (ZESTORETIC) 10-12.5 MG tablet  Daily        01/21/23 0352    sertraline (ZOLOFT) 50 MG tablet  Daily        01/21/23 0352    oseltamivir (TAMIFLU) 75 MG capsule  2 times daily        01/21/23 4098              Discharge Instructions Discussed with and Provided to Patient:   Discharge Instructions   None      Roxy Horseman, PA-C 01/21/23 0408    Palumbo, April, MD 01/21/23 [phone]

## 2023-02-17 ENCOUNTER — Ambulatory Visit
Admission: EM | Admit: 2023-02-17 | Discharge: 2023-02-17 | Disposition: A | Discharge status: Home / Self Care | Payer: Medicaid Other | Attending: Family Medicine | Admitting: Family Medicine

## 2023-02-17 DIAGNOSIS — R600 Localized edema: Secondary | ICD-10-CM | POA: Diagnosis not present

## 2023-02-17 DIAGNOSIS — I10 Essential (primary) hypertension: Secondary | ICD-10-CM | POA: Diagnosis not present

## 2023-02-17 LAB — POCT URINE PREGNANCY: Preg Test, Ur: NEGATIVE

## 2023-02-17 MED ORDER — FUROSEMIDE 80 MG PO TABS: 80.0000 mg | ORAL_TABLET | Freq: Every day | ORAL | 0 refills | Status: DC

## 2023-02-17 NOTE — ED Triage Notes (Signed)
Pt c/o bilat ankle swelling x 3 days with R>L-also requesting preg test/LMP ~12/20-NAD-steady gait

## 2023-02-17 NOTE — Discharge Instructions (Signed)
Avoid sodium in your foods. Start furosemide for the excess fluid retention. Hold lisinopril-hydrochlorothiazide while on furosemide. Restart the lisinopril-hydrochlorothiazide on Monday.

## 2023-02-17 NOTE — ED Provider Notes (Signed)
[facility]  Note:  This document was prepared using Conservation officer, historic buildings and may include unintentional dictation errors.  MRN: [id number] DOB: [birth year]  Subjective:   Victoria Clayton is a 43 y.o. female presenting for 3-day history of bilateral lower ankle swelling with slight itching on the left.  Patient is also not had a cycle since December.  Would like pregnancy test.  No fever, lower leg pain, calf swelling, redness, wounds, chest pain, shortness of breath.  Patient has hypertension, takes lisinopril hydrochlorothiazide.  She does admit she has been eating really salty food lately including seafood.  No facial or oral swelling.  Denies n/v, abdominal pain, pelvic pain, rashes, dysuria, urinary frequency, hematuria, vaginal discharge.    No current facility-administered medications for this encounter.  Current Outpatient Medications:    acetaminophen (TYLENOL) 325 MG tablet, Take 2 tablets (650 mg total) by mouth every 6 (six) hours as needed., Disp: 36 tablet, Rfl: 0   fluticasone (FLONASE) 50 MCG/ACT nasal spray, Place 1 spray into both nostrils daily., Disp: 16 g, Rfl: 2   hydrOXYzine (ATARAX) 25 MG tablet, Take 1 tablet (25 mg total) by mouth every 8 (eight) hours as needed., Disp: 30 tablet, Rfl: 0   lidocaine (LIDODERM) 5 %, Place 1 patch onto the skin daily. Remove & Discard patch within 12 hours or as directed by MD, Disp: 30 patch, Rfl: 0   lisinopril-hydrochlorothiazide (ZESTORETIC) 10-12.5 MG tablet, Take 1 tablet by mouth daily., Disp: 30 tablet, Rfl: 0   oseltamivir (TAMIFLU) 75 MG capsule, Take 1 capsule (75 mg total) by mouth 2 (two) times daily., Disp: 10 capsule, Rfl: 0   sertraline (ZOLOFT) 50 MG tablet, Take 1 tablet (50 mg total) by mouth daily., Disp: 30 tablet, Rfl: 0   triamcinolone ointment (KENALOG) 0.5 %, Apply 1 Application topically 2 (two) times daily., Disp: 30 g, Rfl: 0   triamcinolone ointment (KENALOG) 0.5 %,  Apply 1 Application topically 2 (two) times daily as needed., Disp: 30 g, Rfl: 0   No Known Allergies  Past Medical History:  Diagnosis Date   Abnormal Pap smear    Anemia    Aneurysmal bone cyst    stable over 5 years, saw NSG   Anxiety    Asthma    Depression    Fibroid    Hypertension    Infection    UTI     Past Surgical History:  Procedure Laterality Date   CESAREAN SECTION  1998, 2000, 2003   DILATION AND CURETTAGE OF UTERUS     with miscarriage   HERNIA REPAIR     TUBAL LIGATION      Family History  Problem Relation Age of Onset   Heart disease Mother        chf   Hypertension Mother    Fibromyalgia Mother    COPD Mother    Alcohol abuse Father    Anesthesia problems Neg Hx     Social History   Tobacco Use   Smoking status: Every Day    Current packs/day: 1.00    Average packs/day: 1 pack/day for 15.0 years (15.0 ttl pk-yrs)    Types: Cigarettes   Smokeless tobacco: Never  Vaping Use   Vaping status: Never Used  Substance Use Topics   Alcohol use: Yes    Comment: social   Drug use: Yes    Types: Marijuana    ROS   Objective:   Vitals: BP 124/87 (BP Location:  Left Arm)   Pulse 89   Temp 98.4 F (36.9 C) (Oral)   Resp 20   LMP 12/23/2022 (Approximate)   SpO2 97%   Physical Exam Constitutional:      General: She is not in acute distress.    Appearance: Normal appearance. She is well-developed. She is not ill-appearing, toxic-appearing or diaphoretic.  HENT:     Head: Normocephalic and atraumatic.     Nose: Nose normal.     Mouth/Throat:     Mouth: Mucous membranes are moist.     Comments: No facial or oral swelling. Eyes:     General: No scleral icterus.       Right eye: No discharge.        Left eye: No discharge.     Extraocular Movements: Extraocular movements intact.  Cardiovascular:     Rate and Rhythm: Normal rate and regular rhythm.     Heart sounds: Normal heart sounds. No murmur heard.    No friction rub. No gallop.   Pulmonary:     Effort: Pulmonary effort is normal. No respiratory distress.     Breath sounds: No stridor. No wheezing, rhonchi or rales.  Chest:     Chest wall: No tenderness.  Musculoskeletal:     Right lower leg: Edema (1+ up to the distal ankle bilaterally) present.     Left lower leg: Edema (1+ up to the distal ankle bilaterally) present.  Skin:    General: Skin is warm and dry.  Neurological:     General: No focal deficit present.     Mental Status: She is alert and oriented to person, place, and time.  Psychiatric:        Mood and Affect: Mood normal.        Behavior: Behavior normal.     Results for orders placed or performed during the hospital encounter of 02/17/23 (from the past 24 hours)  POCT urine pregnancy     Status: Normal   Collection Time: 02/17/23  3:04 PM  Result Value Ref Range   Preg Test, Ur Negative     Assessment and Plan :   PDMP not reviewed this encounter.  1. Peripheral edema   2. Essential hypertension     Recommended low-sodium foods.  Start furosemide 80 mg for 3 days.  Hold lisinopril hydrochlorothiazide.  Low suspicion for congestive heart failure, angioedema, allergic reaction.  Pregnancy test negative.  Counseled patient on potential for adverse effects with medications prescribed/recommended today, ER and return-to-clinic precautions discussed, patient verbalized understanding.    Wallis Bamberg, [place] 02/17/23 [id number]

## 2023-06-22 ENCOUNTER — Encounter (HOSPITAL_COMMUNITY): Payer: Self-pay | Admitting: Emergency Medicine

## 2023-06-22 ENCOUNTER — Ambulatory Visit (HOSPITAL_COMMUNITY)
Admission: EM | Admit: 2023-06-22 | Discharge: 2023-06-22 | Disposition: A | Discharge status: Home / Self Care | Attending: Family Medicine | Admitting: Family Medicine

## 2023-06-22 DIAGNOSIS — L304 Erythema intertrigo: Secondary | ICD-10-CM

## 2023-06-22 DIAGNOSIS — J069 Acute upper respiratory infection, unspecified: Secondary | ICD-10-CM | POA: Diagnosis not present

## 2023-06-22 LAB — POCT RAPID STREP A (OFFICE): Rapid Strep A Screen: NEGATIVE

## 2023-06-22 MED ORDER — ALBUTEROL SULFATE HFA 108 (90 BASE) MCG/ACT IN AERS: 1.0000 | INHALATION_SPRAY | Freq: Four times a day (QID) | RESPIRATORY_TRACT | 0 refills | Status: AC | PRN

## 2023-06-22 MED ORDER — FLUTICASONE PROPIONATE 50 MCG/ACT NA SUSP: 1.0000 | Freq: Every day | NASAL | 0 refills | Status: AC

## 2023-06-22 MED ORDER — NYSTATIN 100000 UNIT/GM EX CREA: TOPICAL_CREAM | CUTANEOUS | 0 refills | Status: AC

## 2023-06-22 NOTE — Discharge Instructions (Signed)
 You have an upper respiratory infection. Most cases are due to a virus and do not require antibiotics for treatment.  Make sure to continue good oral hydration.  You can take tylenol for fever as needed Start Flonase daily for the next week and then as needed. You can use nasal saline spray multiple times daily as well.  You can use a daily antihistamine or guaifenesin as an expectorant but you need to be well hydrated for these medications to work.  Get adequate rest for recovery Maintain distance from others and wear a mask in public areas to avoid spread  If you start to experience shortness of breath, fevers that don't respond to medication, confusion, profound neck stiffness, or fainting, return to the urgent care or ED.

## 2023-06-22 NOTE — ED Triage Notes (Signed)
 Pt reports been coughing for past couple days and urinating on herself which causing vaginal irritation. Pt reports sore all over from coughing. Patient also has nasal congestion. Hasn't taken any medications for symptoms. Only used her albuterol .  Reports has throat irritation since Saturday.

## 2023-06-22 NOTE — ED Provider Notes (Signed)
 [facility]    CSN: [id number] Arrival date & time: 06/22/23  4098      History   Chief Complaint Chief Complaint  Patient presents with   Cough   Vaginal Itching    HPI Victoria Clayton is a 43 y.o. female.   The patient denies exposure to any sick contacts.  She does have several grandchildren that she spends time around.  Her symptoms started with sore throat and then progressed to cough as well as some congestion and postnasal drip.  She has had some wheezing and used a family member's nebulizer to help her.  She has a reported history of asthma but has not been on any chronic inhaler use.  She previously only used rescue inhalers.  She does not have a PCP currently but plans to set up with one.   The history is provided by the patient.  Cough Associated symptoms: sore throat   Associated symptoms: no chest pain, no chills, no ear pain, no fever, no headaches, no shortness of breath and no wheezing   Vaginal Itching Pertinent negatives include no chest pain, no headaches and no shortness of breath.    Past Medical History:  Diagnosis Date   Abnormal Pap smear    Anemia    Aneurysmal bone cyst    stable over 5 years, saw NSG   Anxiety    Asthma    Depression    Fibroid    Hypertension    Infection    UTI    Patient Active Problem List   Diagnosis Date Noted   History of cocaine use 11/25/2019   Marijuana use 11/25/2019   Intramural leiomyoma of uterus 11/25/2019   Menorrhagia with irregular cycle 11/25/2019   Left inguinal hernia 01/28/2013    Past Surgical History:  Procedure Laterality Date   CESAREAN SECTION  1998, 2000, 2003   DILATION AND CURETTAGE OF UTERUS     with miscarriage   HERNIA REPAIR     TUBAL LIGATION      OB History     Gravida  7   Para  3   Term  2   Preterm  1   AB  4   Living  3      SAB  3   IAB  1   Ectopic  0   Multiple  0   Live Births  3            Home Medications    Prior  to Admission medications   Medication Sig Start Date End Date Taking? Authorizing Provider  albuterol  (VENTOLIN  HFA) 108 (90 Base) MCG/ACT inhaler Inhale 1-2 puffs into the lungs every 6 (six) hours as needed for wheezing or shortness of breath (for wheezing only.). 06/22/23  Yes Claybon Cuna, MD  fluticasone  (FLONASE ) 50 MCG/ACT nasal spray Place 1 spray into both nostrils daily. 06/22/23  Yes Claybon Cuna, MD  nystatin cream (MYCOSTATIN) Apply to affected area 2 times daily 06/22/23  Yes Claybon Cuna, MD  acetaminophen  (TYLENOL ) 325 MG tablet Take 2 tablets (650 mg total) by mouth every 6 (six) hours as needed. 03/15/22   Teddi Favors, DO  furosemide  (LASIX ) 80 MG tablet Take 1 tablet (80 mg total) by mouth daily. 02/17/23   Adolph Hoop, PA-C  hydrOXYzine  (ATARAX ) 25 MG tablet Take 1 tablet (25 mg total) by mouth every 8 (eight) hours as needed. 01/21/23   Sherel Dikes, PA-C  lidocaine  (LIDODERM ) 5 %  Place 1 patch onto the skin daily. Remove & Discard patch within 12 hours or as directed by MD 05/03/22   Raspet, [place] Dales K, PA-C  lisinopril -hydrochlorothiazide  (ZESTORETIC ) 10-12.5 MG tablet Take 1 tablet by mouth daily. 01/21/23   Sherel Dikes, PA-C  oseltamivir  (TAMIFLU ) 75 MG capsule Take 1 capsule (75 mg total) by mouth 2 (two) times daily. 01/21/23   Sherel Dikes, PA-C  sertraline  (ZOLOFT ) 50 MG tablet Take 1 tablet (50 mg total) by mouth daily. 01/21/23 02/20/23  Sherel Dikes, PA-C  triamcinolone  ointment (KENALOG ) 0.5 % Apply 1 Application topically 2 (two) times daily. 08/27/21   Piontek, Cleveland Dales, MD  triamcinolone  ointment (KENALOG ) 0.5 % Apply 1 Application topically 2 (two) times daily as needed. 03/15/22   Teddi Favors, DO    Family History Family History  Problem Relation Age of Onset   Heart disease Mother        chf   Hypertension Mother    Fibromyalgia Mother    COPD Mother    Alcohol abuse Father    Anesthesia problems Neg Hx     Social  History Social History   Tobacco Use   Smoking status: Every Day    Current packs/day: 1.00    Average packs/day: 1 pack/day for 15.0 years (15.0 ttl pk-yrs)    Types: Cigarettes   Smokeless tobacco: Never  Vaping Use   Vaping status: Never Used  Substance Use Topics   Alcohol use: Yes    Comment: social   Drug use: Yes    Types: Marijuana     Allergies   Patient has no known allergies.   Review of Systems Review of Systems  Constitutional:  Negative for chills and fever.  HENT:  Positive for congestion, postnasal drip and sore throat. Negative for ear pain, mouth sores, nosebleeds, sinus pressure, sinus pain and trouble swallowing.   Eyes:  Negative for photophobia and visual disturbance.  Respiratory:  Positive for cough. Negative for chest tightness, shortness of breath, wheezing and stridor.   Cardiovascular:  Negative for chest pain, palpitations and leg swelling.  Gastrointestinal:  Negative for abdominal distention, diarrhea, nausea and vomiting.  Genitourinary:  Negative for difficulty urinating, genital sores, pelvic pain, urgency, vaginal discharge and vaginal pain.       Loss of urine when coughing Mild external vaginal irritation  Neurological:  Negative for dizziness, light-headedness and headaches.     Physical Exam Triage Vital Signs ED Triage Vitals  Encounter Vitals Group     BP 06/22/23 1019 (!) 188/112     Girls Systolic BP Percentile --      Girls Diastolic BP Percentile --      Boys Systolic BP Percentile --      Boys Diastolic BP Percentile --      Pulse Rate 06/22/23 1019 78     Resp 06/22/23 1019 20     Temp 06/22/23 1019 98.3 F (36.8 C)     Temp Source 06/22/23 1019 Oral     SpO2 06/22/23 1019 95 %     Weight --      Height --      Head Circumference --      Peak Flow --      Pain Score 06/22/23 1017 0     Pain Loc --      Pain Education --      Exclude from Growth Chart --    No data found.  Updated Vital Signs BP (!) 188/112  (BP Location:  Left Arm)   Pulse 78   Temp 98.3 F (36.8 C) (Oral)   Resp 20   LMP 05/29/2023 (Approximate)   SpO2 95%   Visual Acuity Right Eye Distance:   Left Eye Distance:   Bilateral Distance:    Right Eye Near:   Left Eye Near:    Bilateral Near:     Physical Exam Vitals reviewed.  Constitutional:      General: She is not in acute distress.    Appearance: She is not ill-appearing, toxic-appearing or diaphoretic.  HENT:     Head: Normocephalic and atraumatic.     Right Ear: Tympanic membrane, ear canal and external ear normal.     Left Ear: Tympanic membrane, ear canal and external ear normal.     Ears:     Comments: Bilateral clear mucoid effusions    Nose: Congestion present.     Mouth/Throat:     Mouth: Mucous membranes are moist.     Pharynx: Posterior oropharyngeal erythema present. No oropharyngeal exudate.     Comments: 1+ bilateral tonsillar edema   Eyes:     General: No scleral icterus.       Right eye: No discharge.        Left eye: No discharge.     Extraocular Movements: Extraocular movements intact.     Pupils: Pupils are equal, round, and reactive to light.    Cardiovascular:     Rate and Rhythm: Normal rate and regular rhythm.     Pulses: Normal pulses.     Heart sounds: No murmur heard. Pulmonary:     Effort: Pulmonary effort is normal. No respiratory distress.     Breath sounds: No stridor. Wheezing (Trace expiratory wheezing when coughing) present. No rhonchi or rales.  Abdominal:     General: There is no distension.     Palpations: Abdomen is soft.   Musculoskeletal:     Cervical back: Normal range of motion.     Right lower leg: No edema.     Left lower leg: No edema.  Lymphadenopathy:     Cervical: Cervical adenopathy present.   Skin:    General: Skin is warm.     Capillary Refill: Capillary refill takes 2 to 3 seconds.     Coloration: Skin is not jaundiced.     Findings: No erythema.   Neurological:     General: No focal  deficit present.     Mental Status: She is alert.   Psychiatric:        Mood and Affect: Mood normal.        Behavior: Behavior normal.        Thought Content: Thought content normal.      UC Treatments / Results  Labs (all labs ordered are listed, but only abnormal results are displayed) Labs Reviewed  POCT RAPID STREP A (OFFICE)    EKG   Radiology No results found.  Procedures Procedures (including critical care time)  Medications Ordered in UC Medications - No data to display  Initial Impression / Assessment and Plan / UC Course  I have reviewed the triage vital signs and the nursing notes.  Pertinent labs & imaging results that were available during my care of the patient were reviewed by me and considered in my medical decision making (see chart for details).    URI - The patient is stable with no respiratory distress, although there is some very trace expiratory wheezing with her cough. -Rapid strep: negative - We  discussed symptomatic management.  - The patient can use daily fluticasone  and saline nasal spray for congestion. They can use an oral antihistamine or guaifenesin with increased hydration as well. Hot showers for humidified air and use a bedside humidifier can also benefit if available.  - She does have a history of asthma but has not been on any chronic inhaler management.  She previously had an albuterol  inhaler as needed which I refilled today. - We discussed the use of masking in public areas to avoid spread. - I also strongly recommended establishing with a new PCP and information for a family medicine clinic close by was given. - Return criteria discussed. The patient agreed with the plan and voiced understanding. All questions were answered.   Intertrigo 2/2 stress urinary incontinence - Patient was given nystatin cream to apply.  She does have a history of multiple childbirths and some periodic stress urinary incontinence but no symptoms  consistent with infection. - The patient voiced understanding and agreement to plan.  All questions were answered.  Final Clinical Impressions(s) / UC Diagnoses   Final diagnoses:  Viral upper respiratory tract infection  Intertrigo of genital labia     Discharge Instructions      You have an upper respiratory infection. Most cases are due to a virus and do not require antibiotics for treatment.  Make sure to continue good oral hydration.  You can take tylenol  for fever as needed Start Flonase  daily for the next week and then as needed. You can use nasal saline spray multiple times daily as well.  You can use a daily antihistamine or guaifenesin as an expectorant but you need to be well hydrated for these medications to work.  Get adequate rest for recovery Maintain distance from others and wear a mask in public areas to avoid spread  If you start to experience shortness of breath, fevers that don't respond to medication, confusion, profound neck stiffness, or fainting, return to the urgent care or ED.       ED Prescriptions     Medication Sig Dispense Auth. Provider   fluticasone  (FLONASE ) 50 MCG/ACT nasal spray Place 1 spray into both nostrils daily. 1 g Claybon Cuna, MD   nystatin cream (MYCOSTATIN) Apply to affected area 2 times daily 30 g Claybon Cuna, MD   albuterol  (VENTOLIN  HFA) 108 (90 Base) MCG/ACT inhaler Inhale 1-2 puffs into the lungs every 6 (six) hours as needed for wheezing or shortness of breath (for wheezing only.). 1 each Claybon Cuna, MD      PDMP not reviewed this encounter.   Claybon Cuna, MD 06/22/23 [id number]

## 2023-11-10 ENCOUNTER — Ambulatory Visit
Admission: EM | Admit: 2023-11-10 | Discharge: 2023-11-10 | Disposition: A | Discharge status: Home / Self Care | Attending: Family Medicine | Admitting: Family Medicine

## 2023-11-10 DIAGNOSIS — F419 Anxiety disorder, unspecified: Secondary | ICD-10-CM | POA: Insufficient documentation

## 2023-11-10 DIAGNOSIS — J309 Allergic rhinitis, unspecified: Secondary | ICD-10-CM | POA: Diagnosis present

## 2023-11-10 DIAGNOSIS — R070 Pain in throat: Secondary | ICD-10-CM | POA: Diagnosis present

## 2023-11-10 DIAGNOSIS — Z760 Encounter for issue of repeat prescription: Secondary | ICD-10-CM | POA: Diagnosis present

## 2023-11-10 DIAGNOSIS — I10 Essential (primary) hypertension: Secondary | ICD-10-CM | POA: Diagnosis present

## 2023-11-10 DIAGNOSIS — J069 Acute upper respiratory infection, unspecified: Secondary | ICD-10-CM | POA: Diagnosis present

## 2023-11-10 DIAGNOSIS — J4530 Mild persistent asthma, uncomplicated: Secondary | ICD-10-CM | POA: Diagnosis present

## 2023-11-10 LAB — POCT RAPID STREP A (OFFICE): Rapid Strep A Screen: NEGATIVE

## 2023-11-10 MED ORDER — LISINOPRIL-HYDROCHLOROTHIAZIDE 10-12.5 MG PO TABS: 1.0000 | ORAL_TABLET | Freq: Every day | ORAL | 0 refills | Status: AC

## 2023-11-10 MED ORDER — CETIRIZINE HCL 10 MG PO TABS: 10.0000 mg | ORAL_TABLET | Freq: Every day | ORAL | 0 refills | Status: AC

## 2023-11-10 MED ORDER — PROMETHAZINE-DM 6.25-15 MG/5ML PO SYRP: 5.0000 mL | ORAL_SOLUTION | Freq: Three times a day (TID) | ORAL | 0 refills | Status: AC | PRN

## 2023-11-10 MED ORDER — SERTRALINE HCL 50 MG PO TABS: 50.0000 mg | ORAL_TABLET | Freq: Every day | ORAL | 0 refills | Status: AC

## 2023-11-10 MED ORDER — PREDNISONE 20 MG PO TABS: ORAL_TABLET | ORAL | 0 refills | Status: AC

## 2023-11-10 MED ORDER — HYDROXYZINE HCL 25 MG PO TABS: 25.0000 mg | ORAL_TABLET | Freq: Three times a day (TID) | ORAL | 0 refills | Status: AC | PRN

## 2023-11-10 NOTE — Discharge Instructions (Addendum)
 Start prednisone  to help with your sinus inflammation from the infection, suspected to be virus instead of bacterial. Use Zyrtec  as well to help with the sinuses. Hold Flonase  nasal spray. Use nasal saline washes.   Restart your blood pressure medication. I refilled your medications but follow up with your PCP.   For diabetes or elevated blood sugar, please make sure you are limiting and avoiding starchy, carbohydrate foods like pasta, breads, sweet breads, pastry, rice, potatoes, desserts. These foods can elevate your blood sugar. Also, limit and avoid drinks that contain a lot of sugar such as sodas, sweet teas, fruit juices.  Drinking plain water will be much more helpful, try 64 ounces of water daily.  It is okay to flavor your water naturally by cutting cucumber, lemon, mint or lime, placing it in a picture with water and drinking it over a period of 24-48 hours as long as it remains refrigerated.  For elevated blood pressure, make sure you are monitoring salt in your diet.  Do not eat restaurant foods and limit processed foods at home. I highly recommend you prepare and cook your own foods at home.  Processed foods include things like frozen meals, pre-seasoned meats and dinners, deli meats, canned foods as these foods contain a high amount of sodium/salt.  Make sure you are paying attention to sodium labels on foods you buy at the grocery store. Buy your spices separately such as garlic powder, onion powder, cumin, cayenne, parsley flakes so that you can avoid seasonings that contain salt. However, salt-free seasonings are available and can be used, an example is Mrs. Dash and includes a lot of different mixtures that do not contain salt.  Lastly, when cooking using oils that are healthier for you is important. This includes olive oil, avocado oil, canola oil. We have discussed a lot of foods to avoid but below is a list of foods that can be very healthy to use in your diet whether it is for diabetes,  cholesterol, high blood pressure, or in general healthy eating.  Salads - kale, spinach, cabbage, spring mix, arugula Fruits - avocadoes, berries (blueberries, raspberries, blackberries), apples, oranges, pomegranate, grapefruit, kiwi Vegetables - asparagus, cauliflower, broccoli, green beans, brussel sprouts, bell peppers, beets; stay away from or limit starchy vegetables like potatoes, carrots, peas Other general foods - kidney beans, egg whites, almonds, walnuts, sunflower seeds, pumpkin seeds, fat free yogurt, almond milk, flax seeds, quinoa, oats  Meat - It is better to eat lean meats and limit your red meat including pork to once a week.  Wild caught fish, chicken breast are good options as they tend to be leaner sources of good protein. Still be mindful of the sodium labels for the meats you buy.  DO NOT EAT ANY FOODS ON THIS LIST THAT YOU ARE ALLERGIC TO. For more specific needs, I highly recommend consulting a dietician or nutritionist but this can definitely be a good starting point.

## 2023-11-10 NOTE — ED Provider Notes (Signed)
 [facility]  Note:  This document was prepared using Conservation officer, historic buildings and may include unintentional dictation errors.  MRN: [id number] DOB: June 25, 1980  Subjective:   Victoria Clayton is a 43 y.o. female presenting for 2 chief complaints.  Reports 6-day history of persistent and worsening sinus congestion, drainage, throat pain, painful swallowing, persistent hacking cough that elicits throat pain, chest pain, shortness of breath and wheezing.  Patient has a history of asthma.  She is also a smoker.  Reports that every year she has the same type of problem and usually gets injectable antibiotics and steroids.  She is requesting the same. Needs a medication refill of her blood pressure medicine.  She is not taking currently.  She also needs a refill of her hydroxyzine  and sertraline .  She takes the latter for her anxiety.  No current facility-administered medications for this encounter.  Current Outpatient Medications:    acetaminophen  (TYLENOL ) 325 MG tablet, Take 2 tablets (650 mg total) by mouth every 6 (six) hours as needed., Disp: 36 tablet, Rfl: 0   albuterol  (VENTOLIN  HFA) 108 (90 Base) MCG/ACT inhaler, Inhale 1-2 puffs into the lungs every 6 (six) hours as needed for wheezing or shortness of breath (for wheezing only.)., Disp: 1 each, Rfl: 0   fluticasone  (FLONASE ) 50 MCG/ACT nasal spray, Place 1 spray into both nostrils daily., Disp: 1 g, Rfl: 0   furosemide  (LASIX ) 80 MG tablet, Take 1 tablet (80 mg total) by mouth daily., Disp: 3 tablet, Rfl: 0   hydrOXYzine  (ATARAX ) 25 MG tablet, Take 1 tablet (25 mg total) by mouth every 8 (eight) hours as needed., Disp: 30 tablet, Rfl: 0   lidocaine  (LIDODERM ) 5 %, Place 1 patch onto the skin daily. Remove & Discard patch within 12 hours or as directed by MD, Disp: 30 patch, Rfl: 0   lisinopril -hydrochlorothiazide  (ZESTORETIC ) 10-12.5 MG tablet, Take 1 tablet by mouth daily., Disp: 30 tablet, Rfl:  0   nystatin  cream (MYCOSTATIN ), Apply to affected area 2 times daily, Disp: 30 g, Rfl: 0   oseltamivir  (TAMIFLU ) 75 MG capsule, Take 1 capsule (75 mg total) by mouth 2 (two) times daily., Disp: 10 capsule, Rfl: 0   sertraline  (ZOLOFT ) 50 MG tablet, Take 1 tablet (50 mg total) by mouth daily., Disp: 30 tablet, Rfl: 0   triamcinolone  ointment (KENALOG ) 0.5 %, Apply 1 Application topically 2 (two) times daily., Disp: 30 g, Rfl: 0   triamcinolone  ointment (KENALOG ) 0.5 %, Apply 1 Application topically 2 (two) times daily as needed., Disp: 30 g, Rfl: 0   No Known Allergies  Past Medical History:  Diagnosis Date   Abnormal Pap smear    Anemia    Aneurysmal bone cyst    stable over 5 years, saw NSG   Anxiety    Asthma    Depression    Fibroid    Hypertension    Infection    UTI     Past Surgical History:  Procedure Laterality Date   CESAREAN SECTION  1998, 2000, 2003   DILATION AND CURETTAGE OF UTERUS     with miscarriage   HERNIA REPAIR     TUBAL LIGATION      Family History  Problem Relation Age of Onset   Heart disease Mother        chf   Hypertension Mother    Fibromyalgia Mother    COPD Mother    Alcohol abuse Father    Anesthesia problems Neg Hx  Social History   Tobacco Use   Smoking status: Every Day    Current packs/day: 1.00    Average packs/day: 1 pack/day for 15.0 years (15.0 ttl pk-yrs)    Types: Cigarettes   Smokeless tobacco: Never  Vaping Use   Vaping status: Never Used  Substance Use Topics   Alcohol use: Yes    Comment: social   Drug use: Yes    Types: Marijuana    ROS   Objective:   Vitals: Pulse 87   Temp 98 F (36.7 C) (Oral)   Resp 18   LMP 10/29/2023 (Approximate)   SpO2 98%   BP Readings from Last 3 Encounters:  06/22/23 (!) 188/112  02/17/23 124/87  01/21/23 (!) 155/90   Physical Exam Constitutional:      General: She is not in acute distress.    Appearance: Normal appearance. She is well-developed and normal  weight. She is not ill-appearing, toxic-appearing or diaphoretic.  HENT:     Head: Normocephalic and atraumatic.     Right Ear: Tympanic membrane, ear canal and external ear normal. No drainage or tenderness. No middle ear effusion. There is no impacted cerumen. Tympanic membrane is not erythematous or bulging.     Left Ear: Tympanic membrane, ear canal and external ear normal. No drainage or tenderness.  No middle ear effusion. There is no impacted cerumen. Tympanic membrane is not erythematous or bulging.     Nose: Congestion present. No rhinorrhea.     Mouth/Throat:     Mouth: Mucous membranes are moist. No oral lesions.     Pharynx: No pharyngeal swelling, oropharyngeal exudate, posterior oropharyngeal erythema or uvula swelling.     Tonsils: No tonsillar exudate or tonsillar abscesses.     Comments: Thick streaks of postnasal drainage overlying pharynx.  Eyes:     General: No scleral icterus.       Right eye: No discharge.        Left eye: No discharge.     Extraocular Movements: Extraocular movements intact.     Right eye: Normal extraocular motion.     Left eye: Normal extraocular motion.     Conjunctiva/sclera: Conjunctivae normal.  Cardiovascular:     Rate and Rhythm: Normal rate and regular rhythm.     Heart sounds: Normal heart sounds. No murmur heard.    No friction rub. No gallop.  Pulmonary:     Effort: Pulmonary effort is normal. No respiratory distress.     Breath sounds: No stridor. No wheezing, rhonchi or rales.  Chest:     Chest wall: No tenderness.  Musculoskeletal:     Cervical back: Normal range of motion and neck supple.  Lymphadenopathy:     Cervical: No cervical adenopathy.  Skin:    General: Skin is warm and dry.  Neurological:     General: No focal deficit present.     Mental Status: She is alert and oriented to person, place, and time.     Cranial Nerves: No cranial nerve deficit.     Motor: No weakness.     Coordination: Coordination normal.      Gait: Gait normal.  Psychiatric:        Mood and Affect: Mood normal.        Behavior: Behavior normal.        Thought Content: Thought content normal.        Judgment: Judgment normal.    Results for orders placed or performed during the hospital encounter of 11/10/23 (from  the past 24 hours)  POCT rapid strep A     Status: Normal   Collection Time: 11/10/23  3:23 PM  Result Value Ref Range   Rapid Strep A Screen Negative Negative   Assessment and Plan :   PDMP not reviewed this encounter.  1. Acute upper respiratory infection   2. Throat pain   3. Essential hypertension   4. Anxiety   5. Encounter for medication refill   6. Allergic rhinitis, unspecified seasonality, unspecified trigger   7. Mild persistent asthma, uncomplicated    Recommended a chest x-ray but patient refused.  Given her underlying allergies, asthma and respiratory symptoms offered prednisone , recommended supportive care for an acute viral respiratory infection.  Emphasized need for compliance with her blood pressure medications.  I refilled these for her so that she can tolerate prednisone .  Practice hypertensive friendly diet, follow-up with PCP urgently.  I also refilled her hydroxyzine  and sertraline .  Counseled patient on potential for adverse effects with medications prescribed/recommended today, ER and return-to-clinic precautions discussed, patient verbalized understanding.    Christopher Savannah, [place] 11/10/23 1625

## 2023-11-10 NOTE — ED Triage Notes (Addendum)
 Pt c/o dry cough, head/chest congestion, sinus congestion, sore throat-sx started 6 days ago-taking aleve , nasal spray-feels she may have strep throat-NAD-steady gait

## 2023-11-13 ENCOUNTER — Ambulatory Visit (HOSPITAL_COMMUNITY): Payer: Self-pay

## 2023-11-13 LAB — CULTURE, GROUP A STREP (THRC)
